# Patient Record
Sex: Male | Born: 1945 | Race: Black or African American | Hispanic: No | Marital: Married | Smoking: Former smoker
Health system: Southern US, Community
[De-identification: ages and names within clinical notes are randomized; demographics above are authoritative.]

## PROBLEM LIST (undated history)

## (undated) DIAGNOSIS — N189 Chronic kidney disease, unspecified: Secondary | ICD-10-CM

## (undated) DIAGNOSIS — I255 Ischemic cardiomyopathy: Secondary | ICD-10-CM

## (undated) DIAGNOSIS — K219 Gastro-esophageal reflux disease without esophagitis: Secondary | ICD-10-CM

## (undated) DIAGNOSIS — I5189 Other ill-defined heart diseases: Secondary | ICD-10-CM

## (undated) DIAGNOSIS — Z8673 Personal history of transient ischemic attack (TIA), and cerebral infarction without residual deficits: Secondary | ICD-10-CM

## (undated) DIAGNOSIS — E8809 Other disorders of plasma-protein metabolism, not elsewhere classified: Secondary | ICD-10-CM

## (undated) DIAGNOSIS — F1010 Alcohol abuse, uncomplicated: Secondary | ICD-10-CM

## (undated) DIAGNOSIS — L409 Psoriasis, unspecified: Secondary | ICD-10-CM

## (undated) DIAGNOSIS — I5042 Chronic combined systolic (congestive) and diastolic (congestive) heart failure: Secondary | ICD-10-CM

## (undated) DIAGNOSIS — I208 Other forms of angina pectoris: Secondary | ICD-10-CM

## (undated) DIAGNOSIS — I2510 Atherosclerotic heart disease of native coronary artery without angina pectoris: Secondary | ICD-10-CM

## (undated) DIAGNOSIS — H9190 Unspecified hearing loss, unspecified ear: Secondary | ICD-10-CM

## (undated) DIAGNOSIS — I82409 Acute embolism and thrombosis of unspecified deep veins of unspecified lower extremity: Secondary | ICD-10-CM

## (undated) DIAGNOSIS — K2270 Barrett's esophagus without dysplasia: Secondary | ICD-10-CM

## (undated) DIAGNOSIS — E785 Hyperlipidemia, unspecified: Secondary | ICD-10-CM

## (undated) DIAGNOSIS — I10 Essential (primary) hypertension: Secondary | ICD-10-CM

## (undated) DIAGNOSIS — R000 Tachycardia, unspecified: Secondary | ICD-10-CM

## (undated) DIAGNOSIS — R609 Edema, unspecified: Secondary | ICD-10-CM

## (undated) DIAGNOSIS — I1310 Hypertensive heart and chronic kidney disease without heart failure, with stage 1 through stage 4 chronic kidney disease, or unspecified chronic kidney disease: Secondary | ICD-10-CM

## (undated) DIAGNOSIS — I2089 Other forms of angina pectoris: Secondary | ICD-10-CM

## (undated) DIAGNOSIS — H47019 Ischemic optic neuropathy, unspecified eye: Secondary | ICD-10-CM

## (undated) DIAGNOSIS — R0989 Other specified symptoms and signs involving the circulatory and respiratory systems: Secondary | ICD-10-CM

## (undated) DIAGNOSIS — H547 Unspecified visual loss: Secondary | ICD-10-CM

## (undated) HISTORY — DX: Other forms of angina pectoris: I20.89

## (undated) HISTORY — DX: Edema, unspecified: R60.9

## (undated) HISTORY — PX: OTHER SURGICAL HISTORY: SHX169

## (undated) HISTORY — DX: Other forms of angina pectoris: I20.8

## (undated) HISTORY — DX: Atherosclerotic heart disease of native coronary artery without angina pectoris: I25.10

## (undated) HISTORY — PX: APPENDECTOMY: SHX54

## (undated) HISTORY — DX: Personal history of transient ischemic attack (TIA), and cerebral infarction without residual deficits: Z86.73

## (undated) HISTORY — DX: Other specified symptoms and signs involving the circulatory and respiratory systems: R09.89

## (undated) HISTORY — DX: Ischemic cardiomyopathy: I25.5

## (undated) HISTORY — DX: Other disorders of plasma-protein metabolism, not elsewhere classified: E88.09

## (undated) HISTORY — DX: Tachycardia, unspecified: R00.0

## (undated) HISTORY — DX: Chronic combined systolic (congestive) and diastolic (congestive) heart failure: I50.42

## (undated) HISTORY — DX: Hypertensive heart and chronic kidney disease without heart failure, with stage 1 through stage 4 chronic kidney disease, or unspecified chronic kidney disease: I13.10

---

## 1997-09-03 DIAGNOSIS — I2510 Atherosclerotic heart disease of native coronary artery without angina pectoris: Secondary | ICD-10-CM

## 1997-09-03 HISTORY — DX: Atherosclerotic heart disease of native coronary artery without angina pectoris: I25.10

## 1997-09-03 HISTORY — PX: CORONARY ARTERY BYPASS GRAFT: SHX141

## 2003-04-12 ENCOUNTER — Encounter: Admission: RE | Admit: 2003-04-12 | Discharge: 2003-04-12 | Payer: Self-pay | Admitting: Gastroenterology

## 2003-04-12 ENCOUNTER — Encounter: Payer: Self-pay | Admitting: Gastroenterology

## 2004-06-08 ENCOUNTER — Ambulatory Visit (HOSPITAL_COMMUNITY): Admission: RE | Admit: 2004-06-08 | Discharge: 2004-06-08 | Payer: Self-pay | Admitting: Family Medicine

## 2004-08-14 ENCOUNTER — Ambulatory Visit (HOSPITAL_COMMUNITY): Admission: RE | Admit: 2004-08-14 | Discharge: 2004-08-14 | Payer: Self-pay | Admitting: Gastroenterology

## 2004-08-14 ENCOUNTER — Encounter (INDEPENDENT_AMBULATORY_CARE_PROVIDER_SITE_OTHER): Payer: Self-pay | Admitting: *Deleted

## 2004-11-14 ENCOUNTER — Encounter: Admission: RE | Admit: 2004-11-14 | Discharge: 2004-11-14 | Payer: Self-pay | Admitting: Nephrology

## 2006-03-08 ENCOUNTER — Emergency Department (HOSPITAL_COMMUNITY): Admission: EM | Admit: 2006-03-08 | Discharge: 2006-03-08 | Payer: Self-pay | Admitting: Emergency Medicine

## 2007-02-14 HISTORY — PX: CARDIAC CATHETERIZATION: SHX172

## 2007-03-20 DIAGNOSIS — R0989 Other specified symptoms and signs involving the circulatory and respiratory systems: Secondary | ICD-10-CM

## 2007-03-20 HISTORY — DX: Other specified symptoms and signs involving the circulatory and respiratory systems: R09.89

## 2009-07-03 ENCOUNTER — Emergency Department (HOSPITAL_COMMUNITY): Admission: EM | Admit: 2009-07-03 | Discharge: 2009-07-03 | Payer: Self-pay | Admitting: Emergency Medicine

## 2010-11-02 HISTORY — PX: DOPPLER ECHOCARDIOGRAPHY: SHX263

## 2010-12-07 LAB — RAPID URINE DRUG SCREEN, HOSP PERFORMED
Amphetamines: NOT DETECTED
Benzodiazepines: NOT DETECTED
Tetrahydrocannabinol: NOT DETECTED

## 2010-12-07 LAB — TRICYCLICS SCREEN, URINE: TCA Scrn: NOT DETECTED

## 2010-12-07 LAB — DIFFERENTIAL
Basophils Absolute: 0 10*3/uL (ref 0.0–0.1)
Basophils Relative: 0 % (ref 0–1)
Lymphocytes Relative: 30 % (ref 12–46)
Monocytes Absolute: 0.5 10*3/uL (ref 0.1–1.0)
Neutro Abs: 2.8 10*3/uL (ref 1.7–7.7)
Neutrophils Relative %: 59 % (ref 43–77)

## 2010-12-07 LAB — BASIC METABOLIC PANEL
CO2: 19 mEq/L (ref 19–32)
Chloride: 97 mEq/L (ref 96–112)
GFR calc Af Amer: 52 mL/min — ABNORMAL LOW (ref >60)
Glucose, Bld: 114 mg/dL — ABNORMAL HIGH (ref 70–99)
Potassium: 3.4 mEq/L — ABNORMAL LOW (ref 3.5–5.1)
Sodium: 130 mEq/L — ABNORMAL LOW (ref 135–145)

## 2010-12-07 LAB — CBC
HCT: 41 % (ref 39.0–52.0)
MCHC: 33.5 g/dL (ref 30.0–36.0)
MCV: 92.1 fL (ref 78.0–100.0)
Platelets: 121 10*3/uL — ABNORMAL LOW (ref 150–400)
RBC: 4.46 MIL/uL (ref 4.22–5.81)
RDW: 13.9 % (ref 11.5–15.5)

## 2011-01-02 HISTORY — PX: NM MYOVIEW LTD: HXRAD82

## 2011-01-19 NOTE — Op Note (Signed)
NAME:  Devin Williams, Devin Williams             ACCOUNT NO.:  [id number]   MEDICAL RECORD NO.:  [id number]          PATIENT TYPE:  AMB   LOCATION:  ENDO                         FACILITY:  [facility]   PHYSICIAN:  Graylin Shiver, [name]   DATE OF BIRTH:  Aug 05, 1946   DATE OF PROCEDURE:  08/14/2004  DATE OF DISCHARGE:                                 OPERATIVE REPORT   PROCEDURES:  1.  Colonoscopy.  2.  Polypectomy.   INDICATIONS:  Screening.   Informed consent was obtained after explanation of the risks of bleeding,  infection and perforation.   PREMEDICATIONS:  1.  Fentanyl 75 mcg IV.  2.  Versed 6 mg IV.   DESCRIPTION OF PROCEDURE:  With the patient in the left lateral decubitus  position, a rectal exam was performed.  No masses were felt.  The Olympus  colonoscope was inserted into the rectum and advanced around the colon to  the cecum.  Cecal landmarks were identified.  The entire colonic mucosa had  an appearance of a mild melanosis coli.  The cecum otherwise looked normal.  The ascending colon and the transverse colon showed a few diverticula.  The  descending colon and sigmoid showed moderate diverticulosis.  In the distal  sigmoid, there was a 5 mm sessile polyp snared with a mini snare and removed  by snare cautery technique.  The cautery site looked good.  The rectum had  no other abnormalities.  The patient tolerated the procedure well without  complications.   IMPRESSION:  1.  Diverticulosis.  2.  Small sigmoid polyp which was removed.  3.  Mild diffuse melanosis coli.   PLAN:  The pathology will be checked.     SFG/MEDQ  D:  08/14/2004  T:  08/14/2004  Job:  [id number]   cc:   Renaye Rakers, [name]  [phone] [address], [address]  [place]  [place] [address]  Fax: [phone]

## 2011-05-14 ENCOUNTER — Other Ambulatory Visit: Payer: Self-pay | Admitting: Family Medicine

## 2011-05-14 DIAGNOSIS — M7989 Other specified soft tissue disorders: Secondary | ICD-10-CM

## 2011-05-15 ENCOUNTER — Emergency Department (HOSPITAL_COMMUNITY)
Admission: EM | Admit: 2011-05-15 | Discharge: 2011-05-15 | Disposition: A | Discharge status: Home / Self Care | Payer: BC Managed Care – PPO | Attending: Emergency Medicine | Admitting: Emergency Medicine

## 2011-05-15 ENCOUNTER — Ambulatory Visit
Admission: RE | Admit: 2011-05-15 | Discharge: 2011-05-15 | Disposition: A | Discharge status: Home / Self Care | Payer: BC Managed Care – PPO | Source: Ambulatory Visit | Attending: Family Medicine | Admitting: Family Medicine

## 2011-05-15 DIAGNOSIS — E785 Hyperlipidemia, unspecified: Secondary | ICD-10-CM | POA: Insufficient documentation

## 2011-05-15 DIAGNOSIS — M79609 Pain in unspecified limb: Secondary | ICD-10-CM | POA: Insufficient documentation

## 2011-05-15 DIAGNOSIS — I10 Essential (primary) hypertension: Secondary | ICD-10-CM | POA: Insufficient documentation

## 2011-05-15 DIAGNOSIS — H409 Unspecified glaucoma: Secondary | ICD-10-CM | POA: Insufficient documentation

## 2011-05-15 DIAGNOSIS — I82409 Acute embolism and thrombosis of unspecified deep veins of unspecified lower extremity: Secondary | ICD-10-CM | POA: Insufficient documentation

## 2011-05-15 DIAGNOSIS — M7989 Other specified soft tissue disorders: Secondary | ICD-10-CM

## 2011-05-30 ENCOUNTER — Emergency Department (HOSPITAL_COMMUNITY)
Admit: 2011-05-30 | Discharge: 2011-05-30 | Disposition: A | Discharge status: Home / Self Care | Payer: BC Managed Care – PPO | Attending: Emergency Medicine | Admitting: Emergency Medicine

## 2011-05-30 DIAGNOSIS — Z951 Presence of aortocoronary bypass graft: Secondary | ICD-10-CM | POA: Insufficient documentation

## 2011-05-30 DIAGNOSIS — I10 Essential (primary) hypertension: Secondary | ICD-10-CM | POA: Insufficient documentation

## 2011-05-30 DIAGNOSIS — E785 Hyperlipidemia, unspecified: Secondary | ICD-10-CM | POA: Insufficient documentation

## 2011-05-30 DIAGNOSIS — Z7901 Long term (current) use of anticoagulants: Secondary | ICD-10-CM | POA: Insufficient documentation

## 2011-05-30 DIAGNOSIS — R791 Abnormal coagulation profile: Secondary | ICD-10-CM | POA: Insufficient documentation

## 2011-05-30 LAB — DIFFERENTIAL
Basophils Relative: 0 % (ref 0–1)
Eosinophils Absolute: 0.1 10*3/uL (ref 0.0–0.7)
Eosinophils Relative: 1 % (ref 0–5)
Lymphocytes Relative: 21 % (ref 12–46)
Lymphs Abs: 1 10*3/uL (ref 0.7–4.0)
Monocytes Relative: 12 % (ref 3–12)
Neutrophils Relative %: 66 % (ref 43–77)

## 2011-05-30 LAB — CBC
MCV: 87.9 fL (ref 78.0–100.0)
RDW: 14.6 % (ref 11.5–15.5)
WBC: 4.5 10*3/uL (ref 4.0–10.5)

## 2011-05-30 LAB — PROTIME-INR: INR: 5.8 (ref 0.00–1.49)

## 2011-07-20 ENCOUNTER — Other Ambulatory Visit: Payer: Self-pay | Admitting: Family Medicine

## 2011-07-20 DIAGNOSIS — R52 Pain, unspecified: Secondary | ICD-10-CM

## 2011-07-20 DIAGNOSIS — R609 Edema, unspecified: Secondary | ICD-10-CM

## 2011-07-23 ENCOUNTER — Other Ambulatory Visit: Payer: BC Managed Care – PPO

## 2011-07-23 ENCOUNTER — Inpatient Hospital Stay
Admission: RE | Admit: 2011-07-23 | Discharge: 2011-07-23 | Payer: BC Managed Care – PPO | Source: Ambulatory Visit | Attending: Family Medicine | Admitting: Family Medicine

## 2012-01-18 ENCOUNTER — Other Ambulatory Visit: Payer: Self-pay | Admitting: Family Medicine

## 2012-01-18 DIAGNOSIS — M79604 Pain in right leg: Secondary | ICD-10-CM

## 2012-01-21 ENCOUNTER — Other Ambulatory Visit: Payer: BC Managed Care – PPO

## 2012-02-29 ENCOUNTER — Ambulatory Visit
Admission: RE | Admit: 2012-02-29 | Discharge: 2012-02-29 | Disposition: A | Discharge status: Home / Self Care | Payer: BC Managed Care – PPO | Source: Ambulatory Visit | Attending: Family Medicine | Admitting: Family Medicine

## 2012-02-29 DIAGNOSIS — M79604 Pain in right leg: Secondary | ICD-10-CM

## 2012-11-01 DIAGNOSIS — I82409 Acute embolism and thrombosis of unspecified deep veins of unspecified lower extremity: Secondary | ICD-10-CM

## 2012-11-01 DIAGNOSIS — I5189 Other ill-defined heart diseases: Secondary | ICD-10-CM

## 2012-11-01 HISTORY — DX: Other ill-defined heart diseases: I51.89

## 2012-11-01 HISTORY — DX: Acute embolism and thrombosis of unspecified deep veins of unspecified lower extremity: I82.409

## 2012-11-17 ENCOUNTER — Other Ambulatory Visit: Payer: Self-pay

## 2012-11-17 ENCOUNTER — Encounter (HOSPITAL_COMMUNITY): Payer: Self-pay

## 2012-11-17 ENCOUNTER — Inpatient Hospital Stay (HOSPITAL_COMMUNITY)
Admission: EM | Admit: 2012-11-17 | Discharge: 2012-11-24 | DRG: 300 | Disposition: A | Discharge status: Skilled Nursing Facility | Payer: Medicare Other | Attending: Internal Medicine | Admitting: Internal Medicine

## 2012-11-17 ENCOUNTER — Emergency Department (HOSPITAL_COMMUNITY): Payer: Medicare Other

## 2012-11-17 DIAGNOSIS — Z9114 Patient's other noncompliance with medication regimen: Secondary | ICD-10-CM

## 2012-11-17 DIAGNOSIS — I129 Hypertensive chronic kidney disease with stage 1 through stage 4 chronic kidney disease, or unspecified chronic kidney disease: Secondary | ICD-10-CM | POA: Diagnosis present

## 2012-11-17 DIAGNOSIS — I82409 Acute embolism and thrombosis of unspecified deep veins of unspecified lower extremity: Secondary | ICD-10-CM | POA: Diagnosis present

## 2012-11-17 DIAGNOSIS — Z9119 Patient's noncompliance with other medical treatment and regimen: Secondary | ICD-10-CM

## 2012-11-17 DIAGNOSIS — E875 Hyperkalemia: Secondary | ICD-10-CM

## 2012-11-17 DIAGNOSIS — N189 Chronic kidney disease, unspecified: Secondary | ICD-10-CM | POA: Diagnosis present

## 2012-11-17 DIAGNOSIS — N179 Acute kidney failure, unspecified: Secondary | ICD-10-CM

## 2012-11-17 DIAGNOSIS — D6959 Other secondary thrombocytopenia: Secondary | ICD-10-CM | POA: Diagnosis present

## 2012-11-17 DIAGNOSIS — I82403 Acute embolism and thrombosis of unspecified deep veins of lower extremity, bilateral: Secondary | ICD-10-CM

## 2012-11-17 DIAGNOSIS — M7989 Other specified soft tissue disorders: Secondary | ICD-10-CM

## 2012-11-17 DIAGNOSIS — E872 Acidosis, unspecified: Secondary | ICD-10-CM

## 2012-11-17 DIAGNOSIS — I2510 Atherosclerotic heart disease of native coronary artery without angina pectoris: Secondary | ICD-10-CM | POA: Diagnosis present

## 2012-11-17 DIAGNOSIS — Z91199 Patient's noncompliance with other medical treatment and regimen due to unspecified reason: Secondary | ICD-10-CM

## 2012-11-17 DIAGNOSIS — F1020 Alcohol dependence, uncomplicated: Secondary | ICD-10-CM | POA: Diagnosis present

## 2012-11-17 DIAGNOSIS — I824Y9 Acute embolism and thrombosis of unspecified deep veins of unspecified proximal lower extremity: Principal | ICD-10-CM | POA: Diagnosis present

## 2012-11-17 DIAGNOSIS — Z951 Presence of aortocoronary bypass graft: Secondary | ICD-10-CM

## 2012-11-17 DIAGNOSIS — I10 Essential (primary) hypertension: Secondary | ICD-10-CM | POA: Diagnosis present

## 2012-11-17 DIAGNOSIS — Z86718 Personal history of other venous thrombosis and embolism: Secondary | ICD-10-CM

## 2012-11-17 DIAGNOSIS — E785 Hyperlipidemia, unspecified: Secondary | ICD-10-CM | POA: Diagnosis present

## 2012-11-17 HISTORY — DX: Essential (primary) hypertension: I10

## 2012-11-17 HISTORY — DX: Chronic kidney disease, unspecified: N18.9

## 2012-11-17 HISTORY — DX: Hyperlipidemia, unspecified: E78.5

## 2012-11-17 HISTORY — DX: Psoriasis, unspecified: L40.9

## 2012-11-17 HISTORY — DX: Acute embolism and thrombosis of unspecified deep veins of unspecified lower extremity: I82.409

## 2012-11-17 LAB — POCT I-STAT, CHEM 8
Chloride: 106 mEq/L (ref 96–112)
Creatinine, Ser: 3.1 mg/dL — ABNORMAL HIGH (ref 0.50–1.35)
Glucose, Bld: 167 mg/dL — ABNORMAL HIGH (ref 70–99)
Potassium: 6.6 mEq/L (ref 3.5–5.1)

## 2012-11-17 LAB — PROTIME-INR
INR: 1.09 (ref 0.00–1.49)
Prothrombin Time: 14 seconds (ref 11.6–15.2)

## 2012-11-17 LAB — COMPREHENSIVE METABOLIC PANEL
Albumin: 2.7 g/dL — ABNORMAL LOW (ref 3.5–5.2)
Alkaline Phosphatase: 184 U/L — ABNORMAL HIGH (ref 39–117)
BUN: 15 mg/dL (ref 6–23)
BUN: 15 mg/dL (ref 6–23)
Calcium: 8.8 mg/dL (ref 8.4–10.5)
Calcium: 8.8 mg/dL (ref 8.4–10.5)
Creatinine, Ser: 2.74 mg/dL — ABNORMAL HIGH (ref 0.50–1.35)
GFR calc Af Amer: 26 mL/min — ABNORMAL LOW (ref >90)
GFR calc Af Amer: 26 mL/min — ABNORMAL LOW (ref >90)
GFR calc non Af Amer: 23 mL/min — ABNORMAL LOW (ref >90)
Glucose, Bld: 159 mg/dL — ABNORMAL HIGH (ref 70–99)
Glucose, Bld: 110 mg/dL — ABNORMAL HIGH (ref 70–99)
Potassium: 6.5 mEq/L (ref 3.5–5.1)
Potassium: 6.2 mEq/L — ABNORMAL HIGH (ref 3.5–5.1)
Total Protein: 8 g/dL (ref 6.0–8.3)
Total Protein: 6.7 g/dL (ref 6.0–8.3)

## 2012-11-17 LAB — APTT: aPTT: 30 seconds (ref 24–37)

## 2012-11-17 LAB — CBC WITH DIFFERENTIAL/PLATELET
Eosinophils Absolute: 0.2 10*3/uL (ref 0.0–0.7)
Eosinophils Relative: 4 % (ref 0–5)
Hemoglobin: 14.5 g/dL (ref 13.0–17.0)
Lymphs Abs: 0.7 10*3/uL (ref 0.7–4.0)
MCH: 31.3 pg (ref 26.0–34.0)
MCV: 91.6 fL (ref 78.0–100.0)
Monocytes Relative: 13 % — ABNORMAL HIGH (ref 3–12)
RBC: 4.63 MIL/uL (ref 4.22–5.81)

## 2012-11-17 LAB — URINALYSIS, ROUTINE W REFLEX MICROSCOPIC
Ketones, ur: NEGATIVE mg/dL
Leukocytes, UA: NEGATIVE
Nitrite: NEGATIVE
Urobilinogen, UA: 0.2 mg/dL (ref 0.0–1.0)
pH: 5 (ref 5.0–8.0)

## 2012-11-17 LAB — TROPONIN I: Troponin I: <0.3 ng/mL (ref <0.30)

## 2012-11-17 LAB — D-DIMER, QUANTITATIVE: D-Dimer, Quant: 9.69 ug/mL-FEU — ABNORMAL HIGH (ref 0.00–0.48)

## 2012-11-17 MED ORDER — ACETAMINOPHEN 650 MG RE SUPP: 650.0000 mg | Freq: Four times a day (QID) | RECTAL | Status: DC | PRN

## 2012-11-17 MED ORDER — FOLIC ACID 1 MG PO TABS
1.0000 mg | ORAL_TABLET | Freq: Every day | ORAL | Status: DC
  Administered 2012-11-18 – 2012-11-24 (×7): 1 mg via ORAL
  Filled 2012-11-17 (×7): qty 1

## 2012-11-17 MED ORDER — DIPHENHYDRAMINE HCL 50 MG PO CAPS
50.0000 mg | ORAL_CAPSULE | ORAL | Status: DC | PRN
  Administered 2012-11-18 – 2012-11-24 (×19): 50 mg via ORAL
  Filled 2012-11-17 (×19): qty 1

## 2012-11-17 MED ORDER — DIPHENHYDRAMINE HCL 50 MG/ML IJ SOLN
25.0000 mg | Freq: Once | INTRAMUSCULAR | Status: AC
  Administered 2012-11-18: 25 mg via INTRAVENOUS
  Filled 2012-11-17: qty 1

## 2012-11-17 MED ORDER — VITAMIN B-1 100 MG PO TABS
100.0000 mg | ORAL_TABLET | Freq: Every day | ORAL | Status: DC
  Administered 2012-11-18 – 2012-11-24 (×7): 100 mg via ORAL
  Filled 2012-11-17 (×7): qty 1

## 2012-11-17 MED ORDER — ONDANSETRON HCL 4 MG/2ML IJ SOLN: 4.0000 mg | Freq: Four times a day (QID) | INTRAMUSCULAR | Status: DC | PRN

## 2012-11-17 MED ORDER — HYDROCORTISONE 0.5 % EX CREA
1.0000 "application " | TOPICAL_CREAM | Freq: Two times a day (BID) | CUTANEOUS | Status: DC
  Administered 2012-11-18 – 2012-11-24 (×13): 1 via TOPICAL
  Filled 2012-11-17 (×3): qty 28.35

## 2012-11-17 MED ORDER — SODIUM CHLORIDE 0.9 % IV SOLN
INTRAVENOUS | Status: DC
  Administered 2012-11-17: 19:00:00 via INTRAVENOUS

## 2012-11-17 MED ORDER — ATORVASTATIN CALCIUM 20 MG PO TABS
20.0000 mg | ORAL_TABLET | Freq: Every day | ORAL | Status: DC
  Administered 2012-11-18 – 2012-11-23 (×7): 20 mg via ORAL
  Filled 2012-11-17 (×8): qty 1

## 2012-11-17 MED ORDER — ASPIRIN EC 81 MG PO TBEC
81.0000 mg | DELAYED_RELEASE_TABLET | Freq: Every day | ORAL | Status: DC
  Administered 2012-11-18 – 2012-11-24 (×7): 81 mg via ORAL
  Filled 2012-11-17 (×7): qty 1

## 2012-11-17 MED ORDER — LORAZEPAM 2 MG/ML IJ SOLN: 1.0000 mg | Freq: Four times a day (QID) | INTRAMUSCULAR | Status: AC | PRN

## 2012-11-17 MED ORDER — ACETAMINOPHEN 325 MG PO TABS
650.0000 mg | ORAL_TABLET | Freq: Four times a day (QID) | ORAL | Status: DC | PRN
  Administered 2012-11-20 – 2012-11-24 (×9): 650 mg via ORAL
  Filled 2012-11-17 (×9): qty 2

## 2012-11-17 MED ORDER — SODIUM CHLORIDE 0.9 % IV SOLN
INTRAVENOUS | Status: DC
  Administered 2012-11-17: 1000 mL via INTRAVENOUS

## 2012-11-17 MED ORDER — LORAZEPAM 1 MG PO TABS
1.0000 mg | ORAL_TABLET | Freq: Four times a day (QID) | ORAL | Status: AC | PRN
  Filled 2012-11-17: qty 1

## 2012-11-17 MED ORDER — ACITRETIN 25 MG PO CAPS
25.0000 mg | ORAL_CAPSULE | Freq: Every day | ORAL | Status: DC
  Administered 2012-11-18 – 2012-11-24 (×7): 25 mg via ORAL
  Filled 2012-11-17: qty 1

## 2012-11-17 MED ORDER — LORAZEPAM 1 MG PO TABS: 0.0000 mg | ORAL_TABLET | Freq: Four times a day (QID) | ORAL | Status: AC

## 2012-11-17 MED ORDER — FENOFIBRATE 160 MG PO TABS
160.0000 mg | ORAL_TABLET | Freq: Every day | ORAL | Status: DC
  Administered 2012-11-18 – 2012-11-24 (×7): 160 mg via ORAL
  Filled 2012-11-17 (×7): qty 1

## 2012-11-17 MED ORDER — ADULT MULTIVITAMIN W/MINERALS CH
1.0000 | ORAL_TABLET | Freq: Every day | ORAL | Status: DC
  Administered 2012-11-18 – 2012-11-24 (×7): 1 via ORAL
  Filled 2012-11-17 (×7): qty 1

## 2012-11-17 MED ORDER — LORAZEPAM 1 MG PO TABS
0.0000 mg | ORAL_TABLET | Freq: Two times a day (BID) | ORAL | Status: AC
  Administered 2012-11-20: 1 mg via ORAL

## 2012-11-17 MED ORDER — SODIUM CHLORIDE 0.9 % IV BOLUS (SEPSIS)
500.0000 mL | Freq: Once | INTRAVENOUS | Status: AC
  Administered 2012-11-17: 500 mL via INTRAVENOUS

## 2012-11-17 MED ORDER — HEPARIN (PORCINE) IN NACL 100-0.45 UNIT/ML-% IJ SOLN
1500.0000 [IU]/h | INTRAMUSCULAR | Status: DC
  Administered 2012-11-17 – 2012-11-18 (×2): 1500 [IU]/h via INTRAVENOUS
  Filled 2012-11-17 (×2): qty 250

## 2012-11-17 MED ORDER — FENOFIBRIC ACID 135 MG PO CPDR: 1.0000 | DELAYED_RELEASE_CAPSULE | Freq: Every day | ORAL | Status: DC

## 2012-11-17 MED ORDER — METOPROLOL TARTRATE 50 MG PO TABS
50.0000 mg | ORAL_TABLET | Freq: Two times a day (BID) | ORAL | Status: DC
  Administered 2012-11-18 – 2012-11-19 (×5): 50 mg via ORAL
  Filled 2012-11-17 (×8): qty 1

## 2012-11-17 MED ORDER — SODIUM CHLORIDE 0.9 % IJ SOLN
3.0000 mL | Freq: Two times a day (BID) | INTRAMUSCULAR | Status: DC
  Administered 2012-11-18 (×2): 3 mL via INTRAVENOUS
  Administered 2012-11-18: 09:00:00 via INTRAVENOUS
  Administered 2012-11-19: 3 mL via INTRAVENOUS

## 2012-11-17 MED ORDER — SODIUM POLYSTYRENE SULFONATE 15 GM/60ML PO SUSP
30.0000 g | Freq: Once | ORAL | Status: DC
  Filled 2012-11-17: qty 120

## 2012-11-17 MED ORDER — SODIUM POLYSTYRENE SULFONATE 15 GM/60ML PO SUSP
30.0000 g | Freq: Once | ORAL | Status: AC
  Administered 2012-11-17: 30 g via ORAL
  Filled 2012-11-17: qty 120

## 2012-11-17 MED ORDER — THIAMINE HCL 100 MG/ML IJ SOLN
100.0000 mg | Freq: Every day | INTRAMUSCULAR | Status: DC
  Filled 2012-11-17 (×7): qty 1

## 2012-11-17 MED ORDER — ONDANSETRON HCL 4 MG PO TABS
4.0000 mg | ORAL_TABLET | Freq: Four times a day (QID) | ORAL | Status: DC | PRN
  Administered 2012-11-19: 4 mg via ORAL
  Filled 2012-11-17: qty 1

## 2012-11-17 MED ORDER — HEPARIN BOLUS VIA INFUSION
4500.0000 [IU] | Freq: Once | INTRAVENOUS | Status: AC
  Administered 2012-11-17: 4500 [IU] via INTRAVENOUS

## 2012-11-17 NOTE — ED Notes (Signed)
Patient's wife reports that the patient was SOB 3-4 days ago and has increased today. Patient was seen by his PCP and was told to go the ED for further evaluation and treatment. Patient has SOB, HR-134, lethargic and weakness.

## 2012-11-17 NOTE — ED Notes (Signed)
Effie Shy EDP made aware of I-Stat results.

## 2012-11-17 NOTE — Progress Notes (Signed)
Arterial blood gas draw attempted X2 RT's without success. RN and Dr. Toniann Fail aware.

## 2012-11-17 NOTE — Progress Notes (Signed)
Called ED for report from nurse Amy. Advised Dr. Toniann Fail in room with pt now.

## 2012-11-17 NOTE — H&P (Signed)
[facility] History and Physical  Devin Williams ZOX:[id number] DOB: 01/25/1946 DOA: 11/17/2012  Referring physician: Dr.Wentz. PCP: No primary provider on file. Dr,Gerald Hill. Specialists: None.  Chief Complaint: Shortness of breath.  HPI: Devin Williams is a 67 y.o. male history of the DVT diagnosed last May and has not been compliant with his medications presents with complaints of increasing shortness of breath over the last 3-4 days. Patient has been feeling short of breath with increased exertion. Denies any chest pain productive cough fever chills. Denies any leg pain or swelling in the lower extremities. He had gone to his PCP today and was referred to the ER because of his symptoms. Dopplers of the lower extremities showed bilateral lower extremity DVT with mobile clot. CT angiogram was unable to be done because of patient's renal failure. Patient was found to be in acute on chronic renal failure with hyperkalemia. EKG was showing sinus tachycardia. Patient was given Kayexalate. At this time patient has been admitted for further management. Patient denies any nausea vomiting abdominal pain dizziness or loss of consciousness. Presently patient is hemodynamically stable.  Review of Systems: As presented in the history of presenting illness nothing else significant.  Past Medical History  Diagnosis Date  . Hypertension   . DVT (deep venous thrombosis)     right leg  . Psoriasis   . Chronic kidney disease   . Coronary artery disease   . Hyperlipidemia    Past Surgical History  Procedure Laterality Date  . Cardiac surgery    . Knee surgery    . Appendectomy    . Coronary artery bypass graft     Social History:  reports that he quit smoking about 12 years ago. He has never used smokeless tobacco. He reports that  drinks alcohol. He reports that he does not use illicit drugs. Lives at home. where does patient live-- Can do ADLs. Can patient participate in ADLs?  No  Known Allergies  Family History  Problem Relation Age of Onset  . Diabetes Mellitus II Father      Prior to Admission medications   Medication Sig Start Date End Date Taking? Authorizing Provider  acitretin (SORIATANE) 25 MG capsule Take 25 mg by mouth daily before breakfast.   Yes Historical Provider, MD  amLODipine (NORVASC) 10 MG tablet Take 5 mg by mouth every morning.   Yes Historical Provider, MD  aspirin EC 81 MG tablet Take 81 mg by mouth daily.   Yes Historical Provider, MD  atorvastatin (LIPITOR) 20 MG tablet Take 20 mg by mouth at bedtime.   Yes Historical Provider, MD  Choline Fenofibrate (FENOFIBRIC ACID) 135 MG CPDR Take 1 capsule by mouth daily.   Yes Historical Provider, MD  hydrocortisone cream 0.5 % Apply 1 application topically 2 (two) times daily.   Yes Historical Provider, MD  lisinopril (PRINIVIL,ZESTRIL) 20 MG tablet Take 20 mg by mouth daily.   Yes Historical Provider, MD  metoprolol (LOPRESSOR) 50 MG tablet Take 50 mg by mouth 2 (two) times daily.   Yes Historical Provider, MD  Multiple Vitamin (MULTIVITAMIN WITH MINERALS) TABS Take 1 tablet by mouth daily.   Yes Historical Provider, MD  potassium chloride (K-DUR,KLOR-CON) 10 MEQ tablet Take 10 mEq by mouth daily.   Yes Historical Provider, MD   Physical Exam: Filed Vitals:   11/17/12 1429 11/17/12 1936 11/17/12 2130  BP: 116/85 122/63 141/86  Pulse: 131 120 132  Temp:  98.8 F (37.1 C)   TempSrc:  Oral   Resp:  24 21  Height:  5\' 9"  (1.753 m)   Weight:  92.987 kg (205 lb)   SpO2: 99% 93% 98%     General:  Well-developed and nourished.  Eyes: No vision in the left eye. Anicteric no pallor.  ENT: No discharge from ears eyes nose or mouth.  Neck: No mass or JVD felt.  Cardiovascular: S1-S2 heard tachycardic.  Respiratory: No rhonchi or crepitations.  Abdomen: Soft nontender bowel sounds present.  Skin: Psoriatic rash seen.  Musculoskeletal: No edema.  Psychiatric: Appears  normal.  Neurologic: Alert awake oriented to time place and person. Moves all extremities.  Labs on Admission:  Basic Metabolic Panel:  Recent Labs Lab 11/17/12 1519 11/17/12 1545 11/17/12 1805  NA 134* 129* 133*  K 6.6* 6.5* 6.2*  CL 106 95* 100  CO2  --  16* 18*  GLUCOSE 167* 159* 110*  BUN 21 15 15   CREATININE 3.10* 2.76* 2.74*  CALCIUM  --  8.8 8.8   Liver Function Tests:  Recent Labs Lab 11/17/12 1545 11/17/12 1805  AST 149* 111*  ALT 62* 50  ALKPHOS 184* 163*  BILITOT 0.6 0.6  PROT 8.0 6.7  ALBUMIN 3.1* 2.7*   No results found for this basename: LIPASE, AMYLASE,  in the last 168 hours No results found for this basename: AMMONIA,  in the last 168 hours CBC:  Recent Labs Lab 11/17/12 1519 11/17/12 1539  WBC  --  5.2  NEUTROABS  --  3.6  HGB 17.0 14.5  HCT 50.0 42.4  MCV  --  91.6  PLT  --  184   Cardiac Enzymes:  Recent Labs Lab 11/17/12 1545  TROPONINI <0.30    BNP (last 3 results)  Recent Labs  11/17/12 1545  PROBNP 3909.0*   CBG: No results found for this basename: GLUCAP,  in the last 168 hours  Radiological Exams on Admission: Dg Chest 2 View  11/17/2012  *RADIOLOGY REPORT*  Clinical Data: Cough  CHEST - 2 VIEW  Comparison: 07/03/2009  Findings: Postop CABG.  Negative for heart failure.  Lungs are clear without infiltrate or effusion.  Multiple healed chronic rib fractures.  IMPRESSION: No acute cardiopulmonary abnormality.   Original Report Authenticated By: [name], [name]     EKG: Independently reviewed. Sinus tachycardia.  Assessment/Plan Principal Problem:   DVT (deep venous thrombosis) Active Problems:   Renal failure (ARF), acute on chronic   CAD (coronary artery disease)   Alcoholism   HTN (hypertension)   Hyperlipidemia   1. Shortness of breath with bilateral DVT concerning for PE - patient has been started on heparin infusion. VQ scan has been ordered. I have discussed with on-call pulmonary critical care.  Patient will be closely monitored in step down. Check troponins and 2-D echo. 2. Acute renal failure on chronic kidney disease with hyperkalemia - hold lisinopril and potassium replacement. Patient so far has been ordered Kayexalate 60 g. Starting patient on bicarbonate drip. Closely follow intake output and metabolic panel. Check UA. 3. Metabolic acidosis - probably from renal failure. Check lactic acid levels. Patient has been started on bicarbonate drip. 4. CAD status post CABG - denies any chest pain. Troponins to be cycle due to possible PE. 5. Hypertension - hold her lisinopril due to renal failure and amlodipine due to low-normal blood pressure. 6. Alcohol abuse - patient has been placed on alcohol withdrawal protocol.    Code Status: Full code.  Family Communication: Wife at the bedside.  Disposition Plan: Admit to inpatient.    Chantella Creech N. [facility] Pager [phone].  If 7PM-7AM, please contact night-coverage [email or web address] Password [facility] 11/17/2012, 10:35 PM

## 2012-11-17 NOTE — ED Provider Notes (Signed)
History     CSN: [id number]  Arrival date & time 11/17/12  1424   First MD Initiated Contact with Patient 11/17/12 1542      Chief Complaint  Patient presents with  . Shortness of Breath    (Consider location/radiation/quality/duration/timing/severity/associated sxs/prior treatment) HPI Comments: Devin Williams is a 67 y.o. Male who is here for evaluation of shortness of breath. The discomfort started 3 days ago. He went to see his PCP today, and was instructed to come to the ED because of the possibility of a "PE". He has previously had DVT, 10 months ago, for which he had a short treatment course of Coumadin. It is unclear why he stopped. He has never had PE. He denies cough, sputum production, nausea, vomiting, weakness, or dizziness. His generalized itching from his psoriasis. He is using hydroxyzine, for pruritus; it has helped. He admits to only intermittently taking some of his medicines for the last month. He, states he'll reason is not taking his medicines, is "my choice". No other known modifying factors.  Patient is a 67 y.o. male presenting with shortness of breath. The history is provided by the patient.  Shortness of Breath   Past Medical History  Diagnosis Date  . Hypertension   . DVT (deep venous thrombosis)     right leg  . Psoriasis   . Chronic kidney disease   . Coronary artery disease   . Hyperlipidemia     Past Surgical History  Procedure Laterality Date  . Cardiac surgery    . Knee surgery    . Appendectomy    . Coronary artery bypass graft      Family History  Problem Relation Age of Onset  . Diabetes Mellitus II Father     History  Substance Use Topics  . Smoking status: Former Smoker    Quit date: 11/17/2000  . Smokeless tobacco: Never Used  . Alcohol Use: Yes     Comment: when I drink I drink for months drinkin g for the past 2 months      Review of Systems  Respiratory: Positive for shortness of breath.   All other systems  reviewed and are negative.    Allergies  Review of patient's allergies indicates no known allergies.  Home Medications   No current outpatient prescriptions on file.  BP 126/81  Pulse 132  Temp(Src) 97.4 F (36.3 C) (Oral)  Resp 21  Ht 5\' 9"  (1.753 m)  Wt 199 lb 15.3 oz (90.7 kg)  BMI 29.52 kg/m2  SpO2 98%  Physical Exam  Nursing note and vitals reviewed. Constitutional: He is oriented to person, place, and time. He appears well-developed and well-nourished.  HENT:  Head: Normocephalic and atraumatic.  Right Ear: External ear normal.  Left Ear: External ear normal.  Eyes: Conjunctivae and EOM are normal. Pupils are equal, round, and reactive to light.  Neck: Normal range of motion and phonation normal. Neck supple.  Cardiovascular: Regular rhythm, normal heart sounds and intact distal pulses.   Tachycardic  Pulmonary/Chest: Effort normal and breath sounds normal. No respiratory distress. He has no wheezes. He has no rales. He exhibits no bony tenderness.  Abdominal: Soft. Normal appearance. There is no tenderness.  Musculoskeletal: Normal range of motion.  Neurological: He is alert and oriented to person, place, and time. He has normal strength. No cranial nerve deficit or sensory deficit. He exhibits normal muscle tone. Coordination normal.  Skin: Skin is warm, dry and intact.  Generalized rash, consistent with  psoriasis, patient itches frequently  Psychiatric: He has a normal mood and affect. His behavior is normal. Judgment and thought content normal.    ED Course  Procedures (including critical care time)  Emergency Department treatment: Kayexalate for hyperkalemia    Date: 06/20/2012  Rate: 130  Rhythm: sinus tachycardia  QRS Axis: normal  PR and QT Intervals: normal  ST/T Wave abnormalities: normal  PR and QRS Conduction Disutrbances:nonspecific intraventricular conduction delay  Narrative Interpretation:   Old EKG Reviewed: changes noted- since  07/03/2009, rate faster  IV fluids, given for acute renal failure, and tachycardia; with volume  Heparin anticoagulation begun in the emergency department   CRITICAL CARE Performed by: Flint Melter   Total critical care time: 40 minutes  Critical care time was exclusive of separately billable procedures and treating other patients.  Critical care was necessary to treat or prevent imminent or life-threatening deterioration.  Critical care was time spent personally by me on the following activities: development of treatment plan with patient and/or surrogate as well as nursing, discussions with consultants, evaluation of patient's response to treatment, examination of patient, obtaining history from patient or surrogate, ordering and performing treatments and interventions, ordering and review of laboratory studies, ordering and review of radiographic studies, pulse oximetry and re-evaluation of patient's condition.  Labs Reviewed  CBC WITH DIFFERENTIAL - Abnormal; Notable for the following:    Monocytes Relative 13 (*)    All other components within normal limits  COMPREHENSIVE METABOLIC PANEL - Abnormal; Notable for the following:    Sodium 129 (*)    Potassium 6.5 (*)    Chloride 95 (*)    CO2 16 (*)    Glucose, Bld 159 (*)    Creatinine, Ser 2.76 (*)    Albumin 3.1 (*)    AST 149 (*)    ALT 62 (*)    Alkaline Phosphatase 184 (*)    GFR calc non Af Amer 23 (*)    GFR calc Af Amer 26 (*)    All other components within normal limits  URINALYSIS, ROUTINE W REFLEX MICROSCOPIC - Abnormal; Notable for the following:    APPearance CLOUDY (*)    All other components within normal limits  D-DIMER, QUANTITATIVE - Abnormal; Notable for the following:    D-Dimer, Quant 9.69 (*)    All other components within normal limits  PRO B NATRIURETIC PEPTIDE - Abnormal; Notable for the following:    Pro B Natriuretic peptide (BNP) 3909.0 (*)    All other components within normal limits   COMPREHENSIVE METABOLIC PANEL - Abnormal; Notable for the following:    Sodium 133 (*)    Potassium 6.2 (*)    CO2 18 (*)    Glucose, Bld 110 (*)    Creatinine, Ser 2.74 (*)    Albumin 2.7 (*)    AST 111 (*)    Alkaline Phosphatase 163 (*)    GFR calc non Af Amer 23 (*)    GFR calc Af Amer 26 (*)    All other components within normal limits  POCT I-STAT, CHEM 8 - Abnormal; Notable for the following:    Sodium 134 (*)    Potassium 6.6 (*)    Creatinine, Ser 3.10 (*)    Glucose, Bld 167 (*)    All other components within normal limits  URINE CULTURE  TROPONIN I  PROTIME-INR  APTT  HEPARIN LEVEL (UNFRACTIONATED)  CBC  TROPONIN I  TROPONIN I  TROPONIN I  BASIC METABOLIC PANEL  Dg Chest 2 View  11/17/2012  *RADIOLOGY REPORT*  Clinical Data: Cough  CHEST - 2 VIEW  Comparison: 07/03/2009  Findings: Postop CABG.  Negative for heart failure.  Lungs are clear without infiltrate or effusion.  Multiple healed chronic rib fractures.  IMPRESSION: No acute cardiopulmonary abnormality.   Original Report Authenticated By: [name], [name]    Nursing Notes Reviewed/ Care Coordinated, and agree without changes. Applicable Imaging Reviewed. Radiologic imaging report reviewed and images by radiography   - viewed, by me. Interpretation of Laboratory Data incorporated into ED treatment  1. DVT (deep venous thrombosis), bilateral   2. Acute renal failure   3. Hyperkalemia   4. Noncompliance with medication regimen   5. Alcoholism   6. Renal failure (ARF), acute on chronic       MDM  Acute venous thromboembolism with likely pulmonary emboli. Associated renal insufficiency and hyperkalemia. Suspect problems are related to medical noncompliance. Patient will need to be admitted for stabilization.        Flint Melter, MD 11/17/12 [phone]

## 2012-11-17 NOTE — Progress Notes (Signed)
ANTICOAGULATION CONSULT NOTE - Initial Consult  Pharmacy Consult for Heparin Indication: pulmonary embolus  No Known Allergies  Patient Measurements: Height: 5\' 9"  (175.3 cm) Weight: 205 lb (92.987 kg) IBW/kg (Calculated) : 70.7 Heparin Dosing Weight: 90 kg  Vital Signs: Temp: 98.8 F (37.1 C) (03/17 1936) Temp src: Oral (03/17 1936) BP: 122/63 mmHg (03/17 1936) Pulse Rate: 120 (03/17 1936)  Labs:  Recent Labs  11/17/12 1519 11/17/12 1539 11/17/12 1545 11/17/12 1805  HGB 17.0 14.5  --   --   HCT 50.0 42.4  --   --   PLT  --  184  --   --   APTT  --   --  30  --   LABPROT  --   --  14.0  --   INR  --   --  1.09  --   CREATININE 3.10*  --  2.76* 2.74*  TROPONINI  --   --  <0.30  --     Estimated Creatinine Clearance: 30.3 ml/min (by C-G formula based on Cr of 2.74).   Medical History: Past Medical History  Diagnosis Date  . Hypertension   . DVT (deep venous thrombosis)     right leg  . Psoriasis     Assessment:  66 YOM presenting with SOB and elevated D-dimer to begin IV heparin for treatment of PE.  Baseline labs for anticoag (CBC, INR, PTT) drawn are WNL.   Pt with AKI: SCr 2.74, CrCl~30 ml/min.  Per MD progress note, pt was treated for DVT 10 months ago for which he completed a short treatment course of warfarin.  Goal of Therapy:  Heparin level 0.3-0.7 units/ml Monitor platelets by anticoagulation protocol: Yes   Plan:  Start IV heparin with 4500 unit bolus followed by infusion started at 1500 units/hr (15 mL/hr).   Check heparin level 6 hours after start of infusion (~0300 3/18). Daily heparin level and CBC.  Clance Boll 11/17/2012,8:09 PM

## 2012-11-17 NOTE — Progress Notes (Signed)
*  Preliminary Results* Bilateral lower extremity venous duplex completed. The right lower extremity is positive for deep vein thrombosis involving the right popliteal and posterior tibial veins, a The left lower extremity is positive for deep vein thrombosis involving the posterior tibial veins with mobile, occlusive, deep vein thrombosis of the left tibioperoneal trunk. No evidence of Baker's cyst bilaterally. Preliminary results discussed with Dr.Wentz.  11/17/2012 8:32 PM Gertie Fey, RDMS, RDCS

## 2012-11-17 NOTE — Progress Notes (Signed)
Attempted abg x 2 RT's, pt cannot stay still, very jerky movements in response to pain. RN made aware.

## 2012-11-18 ENCOUNTER — Encounter (HOSPITAL_COMMUNITY): Payer: Self-pay | Admitting: *Deleted

## 2012-11-18 ENCOUNTER — Inpatient Hospital Stay (HOSPITAL_COMMUNITY): Payer: Medicare Other

## 2012-11-18 LAB — TROPONIN I
Troponin I: <0.3 ng/mL (ref <0.30)
Troponin I: <0.3 ng/mL (ref <0.30)

## 2012-11-18 LAB — BASIC METABOLIC PANEL
BUN: 13 mg/dL (ref 6–23)
CO2: 17 mEq/L — ABNORMAL LOW (ref 19–32)
Calcium: 8.1 mg/dL — ABNORMAL LOW (ref 8.4–10.5)
Calcium: 8.2 mg/dL — ABNORMAL LOW (ref 8.4–10.5)
Calcium: 7.9 mg/dL — ABNORMAL LOW (ref 8.4–10.5)
Chloride: 101 mEq/L (ref 96–112)
Creatinine, Ser: 2.39 mg/dL — ABNORMAL HIGH (ref 0.50–1.35)
Creatinine, Ser: 2.4 mg/dL — ABNORMAL HIGH (ref 0.50–1.35)
GFR calc Af Amer: 31 mL/min — ABNORMAL LOW (ref >90)
GFR calc Af Amer: 28 mL/min — ABNORMAL LOW (ref >90)
GFR calc non Af Amer: 27 mL/min — ABNORMAL LOW (ref >90)
GFR calc non Af Amer: 27 mL/min — ABNORMAL LOW (ref >90)
GFR calc non Af Amer: 25 mL/min — ABNORMAL LOW (ref >90)
Glucose, Bld: 98 mg/dL (ref 70–99)
Potassium: 4.5 mEq/L (ref 3.5–5.1)
Sodium: 134 mEq/L — ABNORMAL LOW (ref 135–145)

## 2012-11-18 LAB — HEPARIN LEVEL (UNFRACTIONATED): Heparin Unfractionated: 0.82 IU/mL — ABNORMAL HIGH (ref 0.30–0.70)

## 2012-11-18 LAB — URINE CULTURE

## 2012-11-18 LAB — CBC
Platelets: 118 10*3/uL — ABNORMAL LOW (ref 150–400)
RBC: 3.95 MIL/uL — ABNORMAL LOW (ref 4.22–5.81)
WBC: 4.6 10*3/uL (ref 4.0–10.5)

## 2012-11-18 LAB — LACTIC ACID, PLASMA: Lactic Acid, Venous: 4 mmol/L — ABNORMAL HIGH (ref 0.5–2.2)

## 2012-11-18 MED ORDER — WARFARIN VIDEO: Freq: Once | Status: DC

## 2012-11-18 MED ORDER — HEPARIN (PORCINE) IN NACL 100-0.45 UNIT/ML-% IJ SOLN
1050.0000 [IU]/h | INTRAMUSCULAR | Status: DC
  Administered 2012-11-19 – 2012-11-21 (×3): 1050 [IU]/h via INTRAVENOUS
  Filled 2012-11-18 (×8): qty 250

## 2012-11-18 MED ORDER — PATIENT'S GUIDE TO USING COUMADIN BOOK
Freq: Once | Status: AC
  Administered 2012-11-18: 18:00:00
  Filled 2012-11-18: qty 1

## 2012-11-18 MED ORDER — TECHNETIUM TO 99M ALBUMIN AGGREGATED
5.5000 | Freq: Once | INTRAVENOUS | Status: AC | PRN
  Administered 2012-11-18: 6 via INTRAVENOUS

## 2012-11-18 MED ORDER — WARFARIN SODIUM 5 MG PO TABS
5.0000 mg | ORAL_TABLET | Freq: Once | ORAL | Status: AC
  Administered 2012-11-18: 5 mg via ORAL
  Filled 2012-11-18: qty 1

## 2012-11-18 MED ORDER — SODIUM BICARBONATE 8.4 % IV SOLN
INTRAVENOUS | Status: DC
  Administered 2012-11-18 (×2): via INTRAVENOUS
  Filled 2012-11-18 (×4): qty 1000

## 2012-11-18 MED ORDER — PANTOPRAZOLE SODIUM 40 MG PO TBEC
40.0000 mg | DELAYED_RELEASE_TABLET | Freq: Every day | ORAL | Status: DC
  Administered 2012-11-18 – 2012-11-24 (×7): 40 mg via ORAL
  Filled 2012-11-18 (×8): qty 1

## 2012-11-18 MED ORDER — WARFARIN - PHARMACIST DOSING INPATIENT: Freq: Every day | Status: DC

## 2012-11-18 MED ORDER — TECHNETIUM TC 99M DIETHYLENETRIAME-PENTAACETIC ACID: 41.0000 | Freq: Once | INTRAVENOUS | Status: AC | PRN

## 2012-11-18 MED ORDER — ENSURE COMPLETE PO LIQD
237.0000 mL | Freq: Two times a day (BID) | ORAL | Status: DC
  Administered 2012-11-19 – 2012-11-24 (×8): 237 mL via ORAL

## 2012-11-18 NOTE — Progress Notes (Addendum)
ANTICOAGULATION CONSULT NOTE - Follow Up Consult  Pharmacy Consult for Heparin, Coumadin Indication: pulmonary embolus and DVT  No Known Allergies  Patient Measurements: Height: 5\' 9"  (175.3 cm) Weight: 199 lb 15.3 oz (90.7 kg) IBW/kg (Calculated) : 70.7  Vital Signs: Temp: 98.3 F (36.8 C) (03/18 1200) Temp src: Oral (03/18 1200) BP: 131/86 mmHg (03/18 1255) Pulse Rate: 93 (03/18 1255)  Labs:  Recent Labs  11/17/12 1519 11/17/12 1539  11/17/12 1545 11/17/12 1805 11/17/12 2344 11/18/12 0043 11/18/12 0245 11/18/12 0525 11/18/12 1150  HGB 17.0 14.5  --   --   --   --   --   --  12.2*  --   HCT 50.0 42.4  --   --   --   --   --   --  35.6*  --   PLT  --  184  --   --   --   --   --   --  118*  --   APTT  --   --   --  30  --   --   --   --   --   --   LABPROT  --   --   --  14.0  --   --   --   --   --   --   INR  --   --   --  1.09  --   --   --   --   --   --   HEPARINUNFRC  --   --   --   --   --   --   --  0.59  --  0.80*  CREATININE 3.10*  --   --  2.76* 2.74*  --  2.39*  --  2.40*  --   TROPONINI  --   --   < > <0.30  --  <0.30  --   --  <0.30 <0.30  < > = values in this interval not displayed.  Estimated Creatinine Clearance: 34.2 ml/min (by C-G formula based on Cr of 2.4).   Medications:  Infusions:  . heparin 1,400 Units/hr (11/18/12 1330)  . [DISCONTINUED] sodium chloride 125 mL/hr at 11/17/12 1939  . [DISCONTINUED] sodium chloride 1,000 mL (11/17/12 2305)  . [DISCONTINUED] dextrose 5 % 1,000 mL with sodium bicarbonate 150 mEq infusion 125 mL/hr at 11/18/12 1058  . [DISCONTINUED] heparin 1,500 Units/hr (11/18/12 1058)    Assessment:  66 yom admit 3/17 with concern for DVT and PE. Dopplers confirmed bilateral lower extremity DVT, with mobile, occlusive DVT in left.  VQ scan showed low probability of PE.  Pharmacy asked to assist with IV Heparin dosing, started 3/17 pm and now starting Coumadin.  Per CHEST guidelines will need 5 day overlap and INR 2  or greater x 24hrs before stopping heparin.  INR 3/17 was 1.09.  Goal of Therapy:  INR 2-3 Heparin level 0.3-0.7 units/ml Monitor platelets by anticoagulation protocol: Yes   Plan:   Start with Coumadin 5mg  tonight. Patient doesn't recall previous doses of Coumadin.  F/u repeat heparin level at 8pm.  Daily heparin level, INR, and CBC.  Continue to monitor H&H and platelets.  Complete Coumadin education.  Charolotte Eke, PharmD, pager [phone]. 11/18/2012,3:43 PM.  Addendum:  A: Heparin level 0.82, remains above target range on 1400units/hr. No bleeding reported/documented.  P: Reduce heparin to 1050units/hr and f/u AM heparin level, ~8hrs from now.  Charolotte Eke, PharmD, pager [phone]. 11/18/2012,9:21 PM.

## 2012-11-18 NOTE — Progress Notes (Signed)
ANTICOAGULATION CONSULT NOTE - Follow Up Consult  Pharmacy Consult for Heparin Indication: pulmonary embolus and DVT  No Known Allergies  Patient Measurements: Height: 5\' 9"  (175.3 cm) Weight: 199 lb 15.3 oz (90.7 kg) IBW/kg (Calculated) : 70.7  Vital Signs: Temp: 97.6 F (36.4 C) (03/18 0800) Temp src: Oral (03/18 0800) BP: 149/89 mmHg (03/18 0748) Pulse Rate: 110 (03/18 0748)  Labs:  Recent Labs  11/17/12 1519 11/17/12 1539  11/17/12 1545 11/17/12 1805 11/17/12 2344 11/18/12 0043 11/18/12 0245 11/18/12 0525 11/18/12 1150  HGB 17.0 14.5  --   --   --   --   --   --  12.2*  --   HCT 50.0 42.4  --   --   --   --   --   --  35.6*  --   PLT  --  184  --   --   --   --   --   --  118*  --   APTT  --   --   --  30  --   --   --   --   --   --   LABPROT  --   --   --  14.0  --   --   --   --   --   --   INR  --   --   --  1.09  --   --   --   --   --   --   HEPARINUNFRC  --   --   --   --   --   --   --  0.59  --  0.80*  CREATININE 3.10*  --   --  2.76* 2.74*  --  2.39*  --  2.40*  --   TROPONINI  --   --   < > <0.30  --  <0.30  --   --  <0.30 <0.30  < > = values in this interval not displayed.  Estimated Creatinine Clearance: 34.2 ml/min (by C-G formula based on Cr of 2.4).   Medications:  Infusions:  . dextrose 5 % 1,000 mL with sodium bicarbonate 150 mEq infusion 125 mL/hr at 11/18/12 0052  . heparin 1,500 Units/hr (11/17/12 2304)  . [DISCONTINUED] sodium chloride 125 mL/hr at 11/17/12 1939  . [DISCONTINUED] sodium chloride 1,000 mL (11/17/12 2305)    Assessment:  66 yom admit 3/17 with concern for DVT and PE.  Pharmacy asked to assist with IV Heparin dosing, started 3/17 pm.  Dopplers 3/17 showed bilateral lower extremity DVT, with mobile, occlusive DVT in left.  VQ scan shows low probability of PE.  CBC:  Hgb 12.2, Plt 118.    Heparin level (0.8) is elevated above goal range.  Goal of Therapy:  Heparin level 0.3-0.7 units/ml Monitor platelets by  anticoagulation protocol: Yes   Plan:   Decrease to heparin IV infusion at 1400 units/hr  Heparin level 6 hours after change  Daily heparin level and CBC  Continue to monitor H&H and platelets  Lynann Beaver PharmD, BCPS Pager [phone] 11/18/2012 1:41 PM

## 2012-11-18 NOTE — Care Management Note (Signed)
    Page 1 of 1   11/18/2012     1:22:34 PM   CARE MANAGEMENT NOTE 11/18/2012  Patient:  Devin Williams, Devin Williams   Account Number:  [id number]  Date Initiated:  11/18/2012  Documentation initiated by:  Lorenda Ishihara  Subjective/Objective Assessment:   67 yo male admitted with dyspnea, K >6, bilateral DVT. PTA lived at home with spouse.     Action/Plan:   Home when stable   Anticipated DC Date:  11/21/2012   Anticipated DC Plan:  HOME/SELF CARE      DC Planning Services  CM consult      Choice offered to / List presented to:             Status of service:  Completed, signed off Medicare Important Message given?   (If response is "NO", the following Medicare IM given date fields will be blank) Date Medicare IM given:   Date Additional Medicare IM given:    Discharge Disposition:  HOME/SELF CARE  Per UR Regulation:  Reviewed for med. necessity/level of care/duration of stay  If discussed at Long Length of Stay Meetings, dates discussed:    Comments:

## 2012-11-18 NOTE — Progress Notes (Signed)
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 He also complained off right leg pain for which she had a Doppler of his legs bilaterally. Patient noted to have acute kidney injury with hyperkalemia and non-anion gap metabolic acidosis.  Assessment/Plan: Shortness of breath with bilateral DVTs Patient admitted to step down with concern for PE given tachycardia and shortness of breath. Sats currently maintained on room air. VQ scan done shows low probability for PE. Has proBNP all 3900. 2-D echo ordered. Does not appear to be volume overloaded clinically. She started on IV heparin drip for bilateral DVTs. It appears that he did not complete his course of Coumadin he had DVTs previously. -Coumadin per pharmacy consult  Acute kidney injury with hyperkalemia and bicarbonate No clear etiology. UA unremarkable. Chest on bicarbonate drip on admission and given Kayexalate. No EKG changes. Monitor repeat lites in the evening. Informs taking about 2 tablets of Motrin daily for past one week for his leg pain. Also on lisinopril. -Will check urine lites and serum uric acid. Check CPK. I will order for ultrasound of his kidneys.   Elevated lactate Repeat lactate level still 3.7  Although improved likely from admission.Patient does not have any GI symptoms. -Followup with his levels in the morning.  Alcohol use Informs drinking up to 1 pint 3 times a day. No signs of withdrawal. Continue CIWA   CAD status post CABG Asymptomatic. Serial cardiac enzymes negative. No EKG changes  Hypertension Holding lisinopril given KK I. and amlodipine given borderline blood pressure on admission..    - Code Status:  Full code Family Communication: None at bedside Disposition Plan: Likely home once stable   Consultants:  None  Procedures:  None   Antibiotics:  *None  HPI/Subjective: Patient seen and examined this morning. Admission H&P reviewed. Complains of dyspnea on exertion. Denies any nausea or vomiting.  Objective: Filed Vitals:   11/18/12 0700 11/18/12 0748 11/18/12 0800 11/18/12 1255  BP: 130/84 149/89  131/86  Pulse: 105 110  93  Temp:   97.6 F (36.4 C)   TempSrc:   Oral   Resp: 20 19  21   Height:      Weight:      SpO2: 100% 99%  99%    Intake/Output Summary (Last 24 hours) at 11/18/12 1501 Last data filed at 11/18/12 1300  Gross per 24 hour  Intake   1926 ml  Output    175 ml  Net   1751 ml   Filed Weights   11/17/12 1936 11/17/12 2300  Weight: 92.987 kg (205 lb) 90.7 kg (199 lb 15.3 oz)    Exam:   General:  Elderly male lying in bed in no acute distress  HEENT: No pallor, moist oral mucosa, no JVD  Cardiovascular: S1 and S2 tachycardic, no murmurs rub or gallop  Respiratory: Clear to auscultation bilaterally, no added sounds  Abdomen: Soft, nontender, nondistended, bowel sounds present  Musculoskeletal:  Warm, trace edema, no swelling or tenderness  CNS: AAO x3    Data Reviewed: Basic Metabolic Panel:  Recent Labs Lab 11/17/12 1519 11/17/12 1545 11/17/12 1805 11/18/12 0043 11/18/12 0525  NA 134* 129* 133* 133* 134*  K 6.6* 6.5* 6.2* 6.1* 5.7*  CL 106 95* 100 104 101  CO2  --  16* 18* 17* 22  GLUCOSE 167* 159* 110* 98 94  BUN 21 15 15 13 13   CREATININE 3.10* 2.76* 2.74* 2.39* 2.40*  CALCIUM  --  8.8 8.8 8.1* 8.2*   Liver Function Tests:  Recent Labs Lab 11/17/12 1545 11/17/12 1805  AST 149* 111*  ALT 62* 50  ALKPHOS 184* 163*  BILITOT 0.6 0.6  PROT 8.0 6.7  ALBUMIN 3.1* 2.7*   No results found for this basename: LIPASE, AMYLASE,  in the last 168 hours No results found for this basename: AMMONIA,  in the last 168  hours CBC:  Recent Labs Lab 11/17/12 1519 11/17/12 1539 11/18/12 0525  WBC  --  5.2 4.6  NEUTROABS  --  3.6  --   HGB 17.0 14.5 12.2*  HCT 50.0 42.4 35.6*  MCV  --  91.6 90.1  PLT  --  184 118*   Cardiac Enzymes:  Recent Labs Lab 11/17/12 1545 11/17/12 2344 11/18/12 0525 11/18/12 1150  TROPONINI <0.30 <0.30 <0.30 <0.30   BNP (last 3 results)  Recent Labs  11/17/12 1545  PROBNP 3909.0*   CBG: No results found for this basename: GLUCAP,  in the last 168 hours  Recent Results (from the past 240 hour(s))  MRSA PCR SCREENING     Status: None   Collection Time    11/18/12  1:06 AM      Result Value Range Status   MRSA by PCR NEGATIVE  NEGATIVE Final   Comment:            The GeneXpert MRSA Assay (FDA     approved for NASAL specimens     only), is one component of a     comprehensive MRSA colonization     surveillance program. It is not     intended to diagnose MRSA     infection nor to guide or     monitor treatment for     MRSA infections.     Studies: Dg Chest 2 View  11/17/2012  *RADIOLOGY REPORT*  Clinical Data: Cough  CHEST - 2 VIEW  Comparison: 07/03/2009  Findings: Postop CABG.  Negative for heart failure.  Lungs are clear without infiltrate or effusion.  Multiple healed chronic rib fractures.  IMPRESSION: No acute cardiopulmonary abnormality.   Original Report Authenticated By: [name], [name]    Nm Pulmonary Perf And Vent  11/18/2012  *RADIOLOGY REPORT*  Clinical Data: Shortness of breath  NM PULMONARY VENTILATION AND PERFUSION SCAN  Views:  Anterior, posterior, right lateral, left lateral, RAO, LAO, RPO, LPO - ventilation and perfusion  Radiopharmaceutical: Technetium 50m DTPA - ventilation; technetium 54m macroaggregated albumin - perfusion  Dose:  41.0 mCi - ventilation; 5.5 mCi - perfusion  Route of administration:  Inhalation - ventilation; intravenous - perfusion  Comparison: Chest radiograph November 17, 2012  Findings:  The ventilation study shows  homogeneous and symmetric uptake of radiotracer bilaterally.  On the perfusion study, there are no segmental perfusion defects. There is photopenia in the major fissures bilaterally.  This is not a finding that is typical for pulmonary embolus, and is more suggestive of fluid in these areas.  IMPRESSION:  Question fluid of both major fissures, a finding not seen on recent chest radiograph.  There are no appreciable segmental ventilation / perfusion mismatches on this study.  This study is felt to constitute an overall low probability of pulmonary embolus.   Original Report Authenticated By:  Bretta Bang, [name]     Scheduled Meds: . acitretin  25 mg Oral QAC breakfast  . aspirin EC  81 mg Oral Daily  . atorvastatin  20 mg Oral QHS  . feeding supplement  237 mL Oral BID BM  . fenofibrate  160 mg Oral Daily  . folic acid  1 mg Oral Daily  . hydrocortisone cream  1 application Topical BID  . LORazepam  0-4 mg Oral Q6H   Followed by  . [START ON 11/20/2012] LORazepam  0-4 mg Oral Q12H  . metoprolol  50 mg Oral BID  . multivitamin with minerals  1 tablet Oral Daily  . sodium chloride  3 mL Intravenous Q12H  . sodium polystyrene  30 g Oral Once  . thiamine  100 mg Oral Daily   Or  . thiamine  100 mg Intravenous Daily   Continuous Infusions: . dextrose 5 % 1,000 mL with sodium bicarbonate 150 mEq infusion 125 mL/hr at 11/18/12 1058  . heparin       Time spent: 35 minutes    Virgen Belland  [facility] Pager [phone] If 7PM-7AM, please contact night-coverage at [email or web address], password [facility] 11/18/2012, 3:01 PM  LOS: 1 day

## 2012-11-18 NOTE — Progress Notes (Signed)
ANTICOAGULATION CONSULT NOTE - Initial Consult  Pharmacy Consult for Heparin Indication: pulmonary embolus  No Known Allergies  Patient Measurements: Height: 5\' 9"  (175.3 cm) Weight: 199 lb 15.3 oz (90.7 kg) IBW/kg (Calculated) : 70.7 Heparin Dosing Weight: 90 kg  Vital Signs: Temp: 97.4 F (36.3 C) (03/17 2300) Temp src: Oral (03/17 2300) BP: 126/81 mmHg (03/17 2300) Pulse Rate: 126 (03/18 0052)  Labs:  Recent Labs  11/17/12 1519 11/17/12 1539 11/17/12 1545 11/17/12 1805 11/17/12 2344 11/18/12 0043 11/18/12 0245  HGB 17.0 14.5  --   --   --   --   --   HCT 50.0 42.4  --   --   --   --   --   PLT  --  184  --   --   --   --   --   APTT  --   --  30  --   --   --   --   LABPROT  --   --  14.0  --   --   --   --   INR  --   --  1.09  --   --   --   --   HEPARINUNFRC  --   --   --   --   --   --  0.59  CREATININE 3.10*  --  2.76* 2.74*  --  2.39*  --   TROPONINI  --   --  <0.30  --  <0.30  --   --     Estimated Creatinine Clearance: 34.3 ml/min (by C-G formula based on Cr of 2.39).   Medical History: Past Medical History  Diagnosis Date  . Hypertension   . DVT (deep venous thrombosis)     right leg  . Psoriasis   . Chronic kidney disease   . Coronary artery disease   . Hyperlipidemia     Assessment:  78 YOM presenting with SOB and elevated D-dimer to begin IV heparin for treatment of PE.  Baseline labs for anticoag (CBC, INR, PTT) drawn are WNL.   Pt with AKI: SCr 2.74, CrCl~30 ml/min.  Per MD progress note, pt was treated for DVT 10 months ago for which he completed a short treatment course of warfarin.  HL= 0.59, no problems reported per RN.  Goal of Therapy:  Heparin level 0.3-0.7 units/ml Monitor platelets by anticoagulation protocol: Yes   Plan:   Continue heparin drip @ 1500 units/hr  Recheck HL @ 11am (with next troponin)  Daily heparin level and CBC.  Lorenza Evangelist 11/18/2012,3:25 AM

## 2012-11-18 NOTE — Plan of Care (Signed)
Problem: Phase I Progression Outcomes Goal: Initial discharge plan identified Outcome: Completed/Met Date Met:  11/18/12 Lives at home with wife

## 2012-11-18 NOTE — Progress Notes (Signed)
INITIAL NUTRITION ASSESSMENT  DOCUMENTATION CODES Per approved criteria  -Not Applicable   INTERVENTION: Recommend liberalizing diet to Regular due to poor po intake Continue daily Multivitamin with minerals Provide Ensure Complete po BID, each supplement provides 350 kcal and 13 grams of protein.   NUTRITION DIAGNOSIS: Inadequate oral intake related to decreased appetite as evidenced by pt report of eating 10% of meals.   Goal: Pt to meet >/= 90% of their estimated nutrition needs  Monitor:  PO intake Wt Labs  Reason for Assessment: MST  67 y.o. male  Admitting Dx: DVT (deep venous thrombosis)  ASSESSMENT: 67 y.o. male history of the DVT diagnosed last May and has not been compliant with his medications presents with complaints of increasing shortness of breath over the last 3-4 days. Dopplers of the lower extremities showed bilateral lower extremity DVT with mobile clot. Pt reports that he had a good appetite and was eating 3 meals daily until about a week ago he had a decrease in appetite and has been eating much less for the past week. Pt reports eating 10% of meals since admission. Pt reports usual body weight at 200 lbs. Pt does not follow any special diet at home.    Height: Ht Readings from Last 1 Encounters:  11/17/12 5\' 9"  (1.753 m)    Weight: Wt Readings from Last 1 Encounters:  11/17/12 199 lb 15.3 oz (90.7 kg)    Ideal Body Weight: 160 lb  % Ideal Body Weight: 124%  Wt Readings from Last 10 Encounters:  11/17/12 199 lb 15.3 oz (90.7 kg)    Usual Body Weight: 200 lbs  % Usual Body Weight: 99.5%  BMI:  Body mass index is 29.52 kg/(m^2).  Estimated Nutritional Needs: Kcal: 2090-2440 Protein: 73-91 grams Fluid: 2.5 L  Skin: dry, flaky, itching per nursing notes  Diet Order: Cardiac  EDUCATION NEEDS: -No education needs identified at this time   Intake/Output Summary (Last 24 hours) at 11/18/12 1240 Last data filed at 11/18/12 0600  Gross per 24 hour  Intake    946 ml  Output    175 ml  Net    771 ml    Last BM: 3/17  Labs:   Recent Labs Lab 11/17/12 1805 11/18/12 0043 11/18/12 0525  NA 133* 133* 134*  K 6.2* 6.1* 5.7*  CL 100 104 101  CO2 18* 17* 22  BUN 15 13 13   CREATININE 2.74* 2.39* 2.40*  CALCIUM 8.8 8.1* 8.2*  GLUCOSE 110* 98 94    CBG (last 3)  No results found for this basename: GLUCAP,  in the last 72 hours  Scheduled Meds: . acitretin  25 mg Oral QAC breakfast  . aspirin EC  81 mg Oral Daily  . atorvastatin  20 mg Oral QHS  . fenofibrate  160 mg Oral Daily  . folic acid  1 mg Oral Daily  . hydrocortisone cream  1 application Topical BID  . LORazepam  0-4 mg Oral Q6H   Followed by  . [START ON 11/20/2012] LORazepam  0-4 mg Oral Q12H  . metoprolol  50 mg Oral BID  . multivitamin with minerals  1 tablet Oral Daily  . sodium chloride  3 mL Intravenous Q12H  . sodium polystyrene  30 g Oral Once  . thiamine  100 mg Oral Daily   Or  . thiamine  100 mg Intravenous Daily    Continuous Infusions: . dextrose 5 % 1,000 mL with sodium bicarbonate 150 mEq infusion 125 mL/hr  at 11/18/12 1058  . heparin 1,500 Units/hr (11/18/12 1058)    Past Medical History  Diagnosis Date  . Hypertension   . DVT (deep venous thrombosis)     right leg  . Psoriasis   . Chronic kidney disease   . Coronary artery disease   . Hyperlipidemia     Past Surgical History  Procedure Laterality Date  . Cardiac surgery    . Knee surgery    . Appendectomy    . Coronary artery bypass graft      Ian Malkin RD, LDN Inpatient Clinical Dietitian Pager: [phone] After Hours Pager: [phone]

## 2012-11-19 ENCOUNTER — Inpatient Hospital Stay (HOSPITAL_COMMUNITY): Payer: Medicare Other

## 2012-11-19 LAB — BASIC METABOLIC PANEL
BUN: 12 mg/dL (ref 6–23)
Chloride: 98 mEq/L (ref 96–112)
Creatinine, Ser: 2.82 mg/dL — ABNORMAL HIGH (ref 0.50–1.35)
GFR calc Af Amer: 25 mL/min — ABNORMAL LOW (ref >90)
GFR calc non Af Amer: 22 mL/min — ABNORMAL LOW (ref >90)
Potassium: 4.4 mEq/L (ref 3.5–5.1)

## 2012-11-19 LAB — PROTIME-INR: Prothrombin Time: 15.9 seconds — ABNORMAL HIGH (ref 11.6–15.2)

## 2012-11-19 LAB — CBC
HCT: 32.1 % — ABNORMAL LOW (ref 39.0–52.0)
MCHC: 34.3 g/dL (ref 30.0–36.0)
Platelets: 119 10*3/uL — ABNORMAL LOW (ref 150–400)
RDW: 13.7 % (ref 11.5–15.5)
WBC: 4.4 10*3/uL (ref 4.0–10.5)

## 2012-11-19 LAB — RAPID URINE DRUG SCREEN, HOSP PERFORMED
Amphetamines: NOT DETECTED
Barbiturates: NOT DETECTED
Benzodiazepines: NOT DETECTED

## 2012-11-19 LAB — HEPARIN LEVEL (UNFRACTIONATED)
Heparin Unfractionated: 0.63 IU/mL (ref 0.30–0.70)
Heparin Unfractionated: 0.45 IU/mL (ref 0.30–0.70)

## 2012-11-19 MED ORDER — SODIUM CHLORIDE 0.9 % IV SOLN
INTRAVENOUS | Status: DC
  Administered 2012-11-19 – 2012-11-24 (×9): via INTRAVENOUS

## 2012-11-19 MED ORDER — WARFARIN SODIUM 5 MG PO TABS
5.0000 mg | ORAL_TABLET | Freq: Once | ORAL | Status: AC
  Administered 2012-11-19: 5 mg via ORAL
  Filled 2012-11-19: qty 1

## 2012-11-19 NOTE — Progress Notes (Signed)
Per ultrasound tech  patient is to be NPO for six hours in order to perform abdominal ultrasound. NPO started at 0900.

## 2012-11-19 NOTE — Progress Notes (Signed)
ANTICOAGULATION CONSULT NOTE - Follow Up Consult  Pharmacy Consult for Heparin, Warfarin Indication: pulmonary embolus  No Known Allergies  Patient Measurements: Height: 5\' 9"  (175.3 cm) Weight: 199 lb 15.3 oz (90.7 kg) IBW/kg (Calculated) : 70.7  Vital Signs: Temp: 99.2 F (37.3 C) (03/19 0400) Temp src: Oral (03/19 0400) BP: 111/76 mmHg (03/19 0400) Pulse Rate: 98 (03/19 0600)  Labs:  Recent Labs  11/17/12 1519 11/17/12 1539  11/17/12 1545  11/17/12 2344  11/18/12 0525 11/18/12 1150 11/18/12 1644 11/18/12 1955 11/19/12 0340  HGB 17.0 14.5  --   --   --   --   --  12.2*  --   --   --  11.0*  HCT 50.0 42.4  --   --   --   --   --  35.6*  --   --   --  32.1*  PLT  --  184  --   --   --   --   --  118*  --   --   --  119*  APTT  --   --   --  30  --   --   --   --   --   --   --   --   LABPROT  --   --   --  14.0  --   --   --   --   --   --   --  15.9*  INR  --   --   --  1.09  --   --   --   --   --   --   --  1.30  HEPARINUNFRC  --   --   --   --   --   --   < >  --  0.80*  --  0.82* 0.63  CREATININE 3.10*  --   --  2.76*  < >  --   < > 2.40*  --  2.57*  --  2.82*  CKTOTAL  --   --   --   --   --   --   --   --   --  75  --   --   TROPONINI  --   --   < > <0.30  --  <0.30  --  <0.30 <0.30  --   --   --   < > = values in this interval not displayed.  Estimated Creatinine Clearance: 29.1 ml/min (by C-G formula based on Cr of 2.82).   Medications:  Scheduled:  . acitretin  25 mg Oral QAC breakfast  . aspirin EC  81 mg Oral Daily  . atorvastatin  20 mg Oral QHS  . feeding supplement  237 mL Oral BID BM  . fenofibrate  160 mg Oral Daily  . folic acid  1 mg Oral Daily  . hydrocortisone cream  1 application Topical BID  . LORazepam  0-4 mg Oral Q6H   Followed by  . [START ON 11/20/2012] LORazepam  0-4 mg Oral Q12H  . metoprolol  50 mg Oral BID  . multivitamin with minerals  1 tablet Oral Daily  . pantoprazole  40 mg Oral Daily  . [COMPLETED] patient's guide to  using coumadin book   Does not apply Once  . sodium chloride  3 mL Intravenous Q12H  . sodium polystyrene  30 g Oral Once  . thiamine  100 mg Oral Daily   Or  .  thiamine  100 mg Intravenous Daily  . [COMPLETED] warfarin  5 mg Oral ONCE-1800  . warfarin   Does not apply Once  . Warfarin - Pharmacist Dosing Inpatient   Does not apply q1800   Infusions:  . heparin 1,050 Units/hr (11/18/12 2130)  . [DISCONTINUED] dextrose 5 % 1,000 mL with sodium bicarbonate 150 mEq infusion 125 mL/hr at 11/18/12 1058  . [DISCONTINUED] heparin 1,500 Units/hr (11/18/12 1058)    Assessment:  66 yom admit 3/17 with concern for DVT and PE.  Pharmacy asked to assist with IV Heparin dosing, started 3/17 pm.  Dopplers 3/17 showed bilateral lower extremity DVT, with mobile, occlusive DVT in left.  VQ scan shows low probability of PE.  CBC:  Hgb (11) decreased , Plt (119) stable.   No bleeding reported.  Heparin level (0.63) is therapeutic.  Heparin infusing at 1050 units/hr.  INR (1.3) remains subtherapeutic, but increased after first warfarin dose. Patient will need a minimum of 5 days warfarin / heparin overlap and until INR > 2 for 24 hours.   Goal of Therapy:  INR 2-3 Heparin level 0.3-0.7 units/ml Monitor platelets by anticoagulation protocol: Yes   Plan:   Warfarin 5mg  PO today at 1800 x1  Continue heparin IV infusion at 1050 units/hr  Heparin level 8 hours to confirm therapeutic level  Daily heparin level and CBC  Continue to monitor H&H and platelets  Lynann Beaver PharmD, BCPS Pager [phone] 11/19/2012 7:54 AM

## 2012-11-19 NOTE — Progress Notes (Signed)
ANTICOAGULATION CONSULT NOTE - Follow Up Consult  Pharmacy Consult for Heparin/Warfarin Indication: pulmonary embolus  No Known Allergies  Patient Measurements: Height: 5\' 9"  (175.3 cm) Weight: 199 lb 15.3 oz (90.7 kg) IBW/kg (Calculated) : 70.7   Vital Signs: Temp: 98.4 F (36.9 C) (03/19 1508) Temp src: Oral (03/19 1508) BP: 116/70 mmHg (03/19 1508) Pulse Rate: 89 (03/19 1508)  Labs:  Recent Labs  11/17/12 1519 11/17/12 1539  11/17/12 1545  11/17/12 2344  11/18/12 0525 11/18/12 1150 11/18/12 1644 11/18/12 1955 11/19/12 0340 11/19/12 1018 11/19/12 1604  HGB 17.0 14.5  --   --   --   --   --  12.2*  --   --   --  11.0*  --   --   HCT 50.0 42.4  --   --   --   --   --  35.6*  --   --   --  32.1*  --   --   PLT  --  184  --   --   --   --   --  118*  --   --   --  119*  --   --   APTT  --   --   --  30  --   --   --   --   --   --   --   --   --   --   LABPROT  --   --   --  14.0  --   --   --   --   --   --   --  15.9*  --   --   INR  --   --   --  1.09  --   --   --   --   --   --   --  1.30  --   --   HEPARINUNFRC  --   --   --   --   --   --   < >  --  0.80*  --  0.82* 0.63  --  0.45  CREATININE 3.10*  --   --  2.76*  < >  --   < > 2.40*  --  2.57*  --  2.82*  --   --   CKTOTAL  --   --   --   --   --   --   --   --   --  75  --   --  69  --   TROPONINI  --   --   < > <0.30  --  <0.30  --  <0.30 <0.30  --   --   --   --   --   < > = values in this interval not displayed.  Estimated Creatinine Clearance: 29.1 ml/min (by C-G formula based on Cr of 2.82).   Medications:  Scheduled:  . acitretin  25 mg Oral QAC breakfast  . aspirin EC  81 mg Oral Daily  . atorvastatin  20 mg Oral QHS  . feeding supplement  237 mL Oral BID BM  . fenofibrate  160 mg Oral Daily  . folic acid  1 mg Oral Daily  . hydrocortisone cream  1 application Topical BID  . LORazepam  0-4 mg Oral Q6H   Followed by  . [START ON 11/20/2012] LORazepam  0-4 mg Oral Q12H  . metoprolol  50 mg  Oral BID  . multivitamin with  minerals  1 tablet Oral Daily  . pantoprazole  40 mg Oral Daily  . [COMPLETED] patient's guide to using coumadin book   Does not apply Once  . sodium chloride  3 mL Intravenous Q12H  . sodium polystyrene  30 g Oral Once  . thiamine  100 mg Oral Daily   Or  . thiamine  100 mg Intravenous Daily  . [COMPLETED] warfarin  5 mg Oral ONCE-1800  . warfarin  5 mg Oral ONCE-1800  . warfarin   Does not apply Once  . Warfarin - Pharmacist Dosing Inpatient   Does not apply q1800   Infusions:  . sodium chloride 75 mL/hr at 11/19/12 1106  . heparin 1,050 Units/hr (11/19/12 0900)   PRN: acetaminophen, acetaminophen, diphenhydrAMINE, LORazepam, LORazepam, ondansetron (ZOFRAN) IV, ondansetron, [EXPIRED] technetium TC 56M diethylenetriame-pentaacetic acid  Assessment:  66 yom admit 3/17 with concern for DVT and PE. Pharmacy asked to assist with IV Heparin dosing, started 3/17 pm.   Dopplers 3/17 showed bilateral lower extremity DVT, with mobile, occlusive DVT in left. VQ scan shows low probability of PE.   CBC: Hgb (11) decreased , Plt (119) stable. No bleeding reported.  Heparin level= 0.45 drawn at 1600 is therapeutic. Heparin infusing at 1050 Units/hour   Goal of Therapy:  INR 2-3 Heparin level 0.3-0.7 units/ml Monitor platelets by anticoagulation protocol: Yes   Plan:   Warfarin 5mg  PO today at 1800 x1  Continue Heparin IV at 1050 Units/hour  Follow with AM labs  Daily Heparin level and CBC  Continue to monitor H&H and platelets  Loletta Specter 11/19/2012,5:17 PM

## 2012-11-19 NOTE — Progress Notes (Signed)
[facility] PROGRESS NOTE  Devin Williams ZOX:[id number] DOB: 1946-05-12 DOA: 11/17/2012 PCP: No primary provider on file.  Assessment/Plan: Shortness of breath with bilateral DVTs  Patient admitted to step down with concern for PE given tachycardia- and shortness of breath.  -Oxygen saturation 99-100% on room air -VQ scan low probability PE -Continue heparin drip -Continue warfarin -Patient took only 2 months of warfarin from his prior episodes of DVT -Coumadin per pharmacy consult  Acute on chronic renal failure  -Abdominal ultrasound to evaluate liver, spleen, kidneys -Random urine sodium and urine creatinine -Likely multifactorial including use of ibuprofen and lisinopril -Informs taking about 2 tablets of Motrin daily for past one week for his leg pain. -Restart IV fluids -Hyperkalemia  -Improved with Kayexalate and IVF -Continue telemetry  UA unremarkable.  Elevated proBNP -Clinically euvolemic -Give fluid challenge -Echocardiogram EF 45-50%, grade 1 diastolic dysfunction, moderately dilated RV Elevated lactate  -Recheck lactic acid Repeat lactate level still 3.7 Although improved likely from admission.Patient does not have any GI symptoms.  Alcohol use/abuse Informs drinking up to 1 pint 3 times a day. No signs of withdrawal. Continue CIWA  -Continue thiamine -Check urine drug screen CAD status post CABG  Asymptomatic. Serial cardiac enzymes negative. No EKG changes  Hypertension  Holding lisinopril given AKI -Continue metoprolol tartrate Thrombocytopenia -Likely due to a combination of DVT as well as suspected  underlying alcoholic cirrhosis -Continue to monitor      Family Communication:   Wife at beside Disposition Plan:   Home when medically stable      Procedures/Studies: Dg Chest 2 View  11/17/2012  *RADIOLOGY REPORT*  Clinical Data: Cough  CHEST - 2 VIEW  Comparison: 07/03/2009  Findings: Postop CABG.  Negative for heart failure.  Lungs  are clear without infiltrate or effusion.  Multiple healed chronic rib fractures.  IMPRESSION: No acute cardiopulmonary abnormality.   Original Report Authenticated By: [name], [name]    Nm Pulmonary Perf And Vent  11/18/2012  *RADIOLOGY REPORT*  Clinical Data: Shortness of breath  NM PULMONARY VENTILATION AND PERFUSION SCAN  Views:  Anterior, posterior, right lateral, left lateral, RAO, LAO, RPO, LPO - ventilation and perfusion  Radiopharmaceutical: Technetium 33m DTPA - ventilation; technetium 78m macroaggregated albumin - perfusion  Dose:  41.0 mCi - ventilation; 5.5 mCi - perfusion  Route of administration:  Inhalation - ventilation; intravenous - perfusion  Comparison: Chest radiograph November 17, 2012  Findings:  The ventilation study shows homogeneous and symmetric uptake of radiotracer bilaterally.  On the perfusion study, there are no segmental perfusion defects. There is photopenia in the major fissures bilaterally.  This is not a finding that is typical for pulmonary embolus, and is more suggestive of fluid in these areas.  IMPRESSION:  Question fluid of both major fissures, a finding not seen on recent chest radiograph.  There are no appreciable segmental ventilation / perfusion mismatches on this study.  This study is felt to constitute an overall low probability of pulmonary embolus.   Original Report Authenticated By: Bretta Bang, [name]          Subjective: Patient feels like he is breathing better. He denies any fevers, chills, chest pain, shortness breath, nausea, vomiting, diarrhea, abdominal pain, dysuria, hematuria. Denies PND or orthopnea.  Objective: Filed Vitals:   11/18/12 1933 11/19/12 0000 11/19/12 0400 11/19/12 0600  BP: 94/68 123/76 111/76   Pulse: 106 88  98  Temp: 98 F (36.7 C) 98.7 F (37.1 C) 99.2 F (37.3 C)  TempSrc: Oral Oral Oral   Resp:  20 20   Height:      Weight:      SpO2:  100% 100%     Intake/Output Summary (Last 24 hours) at 11/19/12  0851 Last data filed at 11/19/12 0600  Gross per 24 hour  Intake 2878.75 ml  Output      0 ml  Net 2878.75 ml   Weight change:  Exam:   General:  Pt is alert, follows commands appropriately, not in acute distress  HEENT: No icterus, No thrush, No neck mass, [place]/AT  Cardiovascular: RRR, S1/S2, no rubs, no gallops  Respiratory: CTA bilaterally, no wheezing, no crackles, no rhonchi  Abdomen: Soft/+BS, non tender, non distended, no guarding  Extremities: 1+ edema, No lymphangitis, No petechiae, No rashes, no synovitis  Data Reviewed: Basic Metabolic Panel:  Recent Labs Lab 11/17/12 1805 11/18/12 0043 11/18/12 0525 11/18/12 1644 11/19/12 0340  NA 133* 133* 134* 134* 132*  K 6.2* 6.1* 5.7* 4.5 4.4  CL 100 104 101 99 98  CO2 18* 17* 22 27 26   GLUCOSE 110* 98 94 98 77  BUN 15 13 13 12 12   CREATININE 2.74* 2.39* 2.40* 2.57* 2.82*  CALCIUM 8.8 8.1* 8.2* 7.9* 8.1*   Liver Function Tests:  Recent Labs Lab 11/17/12 1545 11/17/12 1805  AST 149* 111*  ALT 62* 50  ALKPHOS 184* 163*  BILITOT 0.6 0.6  PROT 8.0 6.7  ALBUMIN 3.1* 2.7*   No results found for this basename: LIPASE, AMYLASE,  in the last 168 hours No results found for this basename: AMMONIA,  in the last 168 hours CBC:  Recent Labs Lab 11/17/12 1519 11/17/12 1539 11/18/12 0525 11/19/12 0340  WBC  --  5.2 4.6 4.4  NEUTROABS  --  3.6  --   --   HGB 17.0 14.5 12.2* 11.0*  HCT 50.0 42.4 35.6* 32.1*  MCV  --  91.6 90.1 89.7  PLT  --  184 118* 119*   Cardiac Enzymes:  Recent Labs Lab 11/17/12 1545 11/17/12 2344 11/18/12 0525 11/18/12 1150 11/18/12 1644  CKTOTAL  --   --   --   --  75  TROPONINI <0.30 <0.30 <0.30 <0.30  --    BNP: No components found with this basename: POCBNP,  CBG: No results found for this basename: GLUCAP,  in the last 168 hours  Recent Results (from the past 240 hour(s))  URINE CULTURE     Status: None   Collection Time    11/17/12  5:42 PM      Result Value Range  Status   Specimen Description URINE, RANDOM   Final   Special Requests NONE   Final   Culture  Setup Time 11/18/2012 02:11   Final   Colony Count 3,000 COLONIES/ML   Final   Culture INSIGNIFICANT GROWTH   Final   Report Status 11/18/2012 FINAL   Final  MRSA PCR SCREENING     Status: None   Collection Time    11/18/12  1:06 AM      Result Value Range Status   MRSA by PCR NEGATIVE  NEGATIVE Final   Comment:            The GeneXpert MRSA Assay (FDA     approved for NASAL specimens     only), is one component of a     comprehensive MRSA colonization     surveillance program. It is not     intended to diagnose MRSA  infection nor to guide or     monitor treatment for     MRSA infections.     Scheduled Meds: . acitretin  25 mg Oral QAC breakfast  . aspirin EC  81 mg Oral Daily  . atorvastatin  20 mg Oral QHS  . feeding supplement  237 mL Oral BID BM  . fenofibrate  160 mg Oral Daily  . folic acid  1 mg Oral Daily  . hydrocortisone cream  1 application Topical BID  . LORazepam  0-4 mg Oral Q6H   Followed by  . [START ON 11/20/2012] LORazepam  0-4 mg Oral Q12H  . metoprolol  50 mg Oral BID  . multivitamin with minerals  1 tablet Oral Daily  . pantoprazole  40 mg Oral Daily  . sodium chloride  3 mL Intravenous Q12H  . sodium polystyrene  30 g Oral Once  . thiamine  100 mg Oral Daily   Or  . thiamine  100 mg Intravenous Daily  . warfarin  5 mg Oral ONCE-1800  . warfarin   Does not apply Once  . Warfarin - Pharmacist Dosing Inpatient   Does not apply q1800   Continuous Infusions: . heparin 1,050 Units/hr (11/18/12 2130)     Ilanna Deihl, DO  [facility] Pager [phone]  If 7PM-7AM, please contact night-coverage [email or web address] Password [facility] 11/19/2012, 8:51 AM   LOS: 2 days

## 2012-11-20 DIAGNOSIS — N189 Chronic kidney disease, unspecified: Secondary | ICD-10-CM

## 2012-11-20 DIAGNOSIS — E872 Acidosis: Secondary | ICD-10-CM

## 2012-11-20 LAB — BASIC METABOLIC PANEL
BUN: 12 mg/dL (ref 6–23)
CO2: 22 mEq/L (ref 19–32)
Calcium: 7.8 mg/dL — ABNORMAL LOW (ref 8.4–10.5)
Creatinine, Ser: 3.11 mg/dL — ABNORMAL HIGH (ref 0.50–1.35)
GFR calc non Af Amer: 20 mL/min — ABNORMAL LOW (ref >90)
Glucose, Bld: 76 mg/dL (ref 70–99)

## 2012-11-20 LAB — CBC
HCT: 29 % — ABNORMAL LOW (ref 39.0–52.0)
MCHC: 33.8 g/dL (ref 30.0–36.0)
MCV: 90.3 fL (ref 78.0–100.0)
Platelets: 116 10*3/uL — ABNORMAL LOW (ref 150–400)
RDW: 13.8 % (ref 11.5–15.5)

## 2012-11-20 LAB — CREATININE, URINE, RANDOM: Creatinine, Urine: 232.8 mg/dL

## 2012-11-20 LAB — MAGNESIUM: Magnesium: 1.3 mg/dL — ABNORMAL LOW (ref 1.5–2.5)

## 2012-11-20 LAB — SODIUM, URINE, RANDOM: Sodium, Ur: 26 mEq/L

## 2012-11-20 MED ORDER — MAGNESIUM SULFATE IN D5W 10-5 MG/ML-% IV SOLN
1.0000 g | Freq: Once | INTRAVENOUS | Status: AC
  Administered 2012-11-20: 1 g via INTRAVENOUS
  Filled 2012-11-20: qty 100

## 2012-11-20 MED ORDER — METOPROLOL TARTRATE 25 MG PO TABS
25.0000 mg | ORAL_TABLET | Freq: Two times a day (BID) | ORAL | Status: DC
  Administered 2012-11-20 – 2012-11-24 (×9): 25 mg via ORAL
  Filled 2012-11-20 (×10): qty 1

## 2012-11-20 MED ORDER — CALCIUM CARBONATE ANTACID 500 MG PO CHEW
400.0000 mg | CHEWABLE_TABLET | Freq: Two times a day (BID) | ORAL | Status: DC | PRN
  Administered 2012-11-20 – 2012-11-24 (×3): 400 mg via ORAL
  Filled 2012-11-20 (×3): qty 2

## 2012-11-20 MED ORDER — VITAMINS A & D EX OINT
TOPICAL_OINTMENT | CUTANEOUS | Status: AC
  Administered 2012-11-20: 5
  Filled 2012-11-20: qty 20

## 2012-11-20 MED ORDER — ALUM & MAG HYDROXIDE-SIMETH 200-200-20 MG/5ML PO SUSP
30.0000 mL | Freq: Four times a day (QID) | ORAL | Status: DC | PRN
  Administered 2012-11-20 – 2012-11-24 (×5): 30 mL via ORAL
  Filled 2012-11-20 (×6): qty 30

## 2012-11-20 NOTE — Evaluation (Signed)
Physical Therapy Evaluation Patient Details Name: Devin Williams MRN: [id number] DOB: Apr 27, 1946 Today's Date: 11/20/2012 Time: 4098-1191 PT Time Calculation (min): 15 min  PT Assessment / Plan / Recommendation Clinical Impression  Pt admitted with SOB and DVTs.  Pt would benefit from acute PT services in order to improve independence with transfers and ambulation prior to d/c home with spouse.  Pt reports he will not need assistive device at home so will see acutely to improve to PLOF.    PT Assessment  Patient needs continued PT services    Follow Up Recommendations       Does the patient have the potential to tolerate intense rehabilitation      Barriers to Discharge        Equipment Recommendations  Rolling walker with 5" wheels;Other (comment) (however pt declines)    Recommendations for Other Services     Frequency Min 3X/week    Precautions / Restrictions     Pertinent Vitals/Pain Reported slight SOB during ambulation (unable to obtain SaO2)      Mobility  Bed Mobility Bed Mobility: Supine to Sit Supine to Sit: 6: Modified independent (Device/Increase time);HOB elevated Transfers Transfers: Stand to Sit;Sit to Stand Sit to Stand: 4: Min guard;With upper extremity assist;From bed Stand to Sit: 4: Min guard;With upper extremity assist;To chair/3-in-1 Ambulation/Gait Ambulation/Gait Assistance: 4: Min guard Ambulation Distance (Feet): 400 Feet Assistive device: Rolling walker Ambulation/Gait Assistance Details: pt agreeable to RW since he hasn't walked since admission  Gait Pattern: Step-through pattern;Trunk flexed Gait velocity: WFL General Gait Details: pt reported slight SOB however pulse not reading and no others available    Exercises     PT Diagnosis: Difficulty walking  PT Problem List: Decreased activity tolerance;Decreased mobility;Cardiopulmonary status limiting activity;Decreased knowledge of use of DME PT Treatment Interventions: Gait  training;Functional mobility training;Stair training;DME instruction;Therapeutic activities;Therapeutic exercise;Patient/family education   PT Goals Acute Rehab PT Goals PT Goal Formulation: With patient Time For Goal Achievement: 11/27/12 Potential to Achieve Goals: Good Pt will go Sit to Stand: with modified independence PT Goal: Sit to Stand - Progress: Goal set today Pt will go Stand to Sit: with modified independence PT Goal: Stand to Sit - Progress: Goal set today Pt will Ambulate: >150 feet;with modified independence PT Goal: Ambulate - Progress: Goal set today Pt will Go Up / Down Stairs: 3-5 stairs;with supervision PT Goal: Up/Down Stairs - Progress: Goal set today  Visit Information  Last PT Received On: 11/20/12 Assistance Needed: +1    Subjective Data  Subjective: I don't have any of that stuff because I don't need it (DME)   Prior Functioning  Home Living Lives With: Spouse Type of Home: House Home Access: Stairs to enter [employer] of Steps: did not state Home Layout: One level Home Adaptive Equipment: None Prior Function Level of Independence: Independent Communication Communication: No difficulties    Cognition  Cognition Overall Cognitive Status: Appears within functional limits for tasks assessed/performed Arousal/Alertness: Awake/alert Orientation Level: Appears intact for tasks assessed Behavior During Session: [facility] for tasks performed    Extremity/Trunk Assessment Right Upper Extremity Assessment RUE ROM/Strength/Tone: [facility] for tasks assessed Left Upper Extremity Assessment LUE ROM/Strength/Tone: [facility] for tasks assessed Right Lower Extremity Assessment RLE ROM/Strength/Tone: [facility] for tasks assessed Left Lower Extremity Assessment LLE ROM/Strength/Tone: [facility] for tasks assessed   Balance    End of Session PT - End of Session Activity Tolerance: Patient tolerated treatment well Patient left: in chair;with call bell/phone within reach;with  chair alarm set  Nurse Communication: Mobility status  GP     Sarinah Doetsch,KATHrine E 11/20/2012, 2:42 PM Zenovia Jarred, PT, DPT 11/20/2012 Pager: [phone]

## 2012-11-20 NOTE — Progress Notes (Addendum)
ANTICOAGULATION CONSULT NOTE - Follow Up Consult  Pharmacy Consult for Heparin/Warfarin Indication: pulmonary embolus  No Known Allergies  Patient Measurements: Height: 5\' 9"  (175.3 cm) Weight: 211 lb 10.3 oz (96 kg) IBW/kg (Calculated) : 70.7   Vital Signs: Temp: 99.1 F (37.3 C) (03/20 0359) Temp src: Oral (03/20 0359) BP: 101/63 mmHg (03/20 0359) Pulse Rate: 92 (03/20 0359)  Labs:  Recent Labs  11/17/12 1519  11/17/12 1545  11/17/12 2344  11/18/12 0525 11/18/12 1150 11/18/12 1644  11/19/12 0340 11/19/12 1018 11/19/12 1604 11/20/12 0507  HGB 17.0  < >  --   --   --   --  12.2*  --   --   --  11.0*  --   --  9.8*  HCT 50.0  < >  --   --   --   --  35.6*  --   --   --  32.1*  --   --  29.0*  PLT  --   < >  --   --   --   --  118*  --   --   --  119*  --   --  116*  APTT  --   --  30  --   --   --   --   --   --   --   --   --   --   --   LABPROT  --   --  14.0  --   --   --   --   --   --   --  15.9*  --   --  17.7*  INR  --   --  1.09  --   --   --   --   --   --   --  1.30  --   --  1.50*  HEPARINUNFRC  --   --   --   --   --   < >  --  0.80*  --   < > 0.63  --  0.45 0.45  CREATININE 3.10*  --  2.76*  < >  --   < > 2.40*  --  2.57*  --  2.82*  --   --  3.11*  CKTOTAL  --   --   --   --   --   --   --   --  75  --   --  69  --   --   TROPONINI  --   < > <0.30  --  <0.30  --  <0.30 <0.30  --   --   --   --   --   --   < > = values in this interval not displayed.  Estimated Creatinine Clearance: 27.1 ml/min (by C-G formula based on Cr of 3.11).   Medications:  Scheduled:  . acitretin  25 mg Oral QAC breakfast  . aspirin EC  81 mg Oral Daily  . atorvastatin  20 mg Oral QHS  . feeding supplement  237 mL Oral BID BM  . fenofibrate  160 mg Oral Daily  . folic acid  1 mg Oral Daily  . hydrocortisone cream  1 application Topical BID  . [EXPIRED] LORazepam  0-4 mg Oral Q6H   Followed by  . LORazepam  0-4 mg Oral Q12H  . metoprolol  50 mg Oral BID  . multivitamin  with minerals  1 tablet Oral Daily  . pantoprazole  40 mg Oral Daily  . sodium chloride  3 mL Intravenous Q12H  . sodium polystyrene  30 g Oral Once  . thiamine  100 mg Oral Daily   Or  . thiamine  100 mg Intravenous Daily  . [COMPLETED] warfarin  5 mg Oral ONCE-1800  . warfarin   Does not apply Once  . Warfarin - Pharmacist Dosing Inpatient   Does not apply q1800   Infusions:  . sodium chloride 75 mL/hr at 11/19/12 2351  . heparin 1,050 Units/hr (11/19/12 0900)   PRN: acetaminophen, acetaminophen, diphenhydrAMINE, LORazepam, LORazepam, ondansetron (ZOFRAN) IV, ondansetron  Assessment:  66 yom admit 3/17 with bilateral lower extremity DVT, with mobile, occlusive DVT in left. VQ scan shows low probability of PE.  Patient initated on IV heparin 3/17 and warfarin started 3/19, Today is D#2 warfarin/coumadin overlap.   Hgb trending down 9.8, Plt low/stable 116.  No bleeding reported.  Per discussion with Dr. Arbutus Leas, decreased hgb thought to be dilutional, low plt thought to be secondary to underlying alcoholic cirrhosis, continue heparin dosing   Heparin level (0.45) this AM is therapeutic. Heparin infusing at 1050 Units/hour   Goal of Therapy:  INR 2-3 Heparin level 0.3-0.7 units/ml Monitor platelets by anticoagulation protocol: Yes   Plan:   Warfarin 5 mg po x 1 at 1800   Continue Heparin IV at 1050 Units/hour - Recommend at least 5 days overlap with warfarin/heparin and until INR is > 2 for 24 hours.   Daily Heparin level and CBC  Continue to monitor H&H and platelets   Keeven Matty, Loma Messing PharmD Pager #: [phone] 7:18 AM 11/20/2012

## 2012-11-20 NOTE — Progress Notes (Addendum)
[facility] PROGRESS NOTE  Devin MEECH ZOX:[id number] DOB: 09/18/1945 DOA: 11/17/2012 PCP: No primary provider on file.  Assessment/Plan: Shortness of breath with bilateral DVTs  Patient admitted to step down with concern for PE given tachycardia- and shortness of breath.  -Oxygen saturation 95-100% on room air  -VQ scan low probability PE  -Continue heparin drip  -Continue warfarin--INR 1.50 -Patient took only 2 months of warfarin from his prior episodes of DVT  -Coumadin per pharmacy consult  -Suspect a component of deconditioning -PT evaluation Acute on chronic renal failure  -Abdominal ultrasound-reveals fatty liver without any hydronephrosis, normal spleen -Random urine sodium and urine creatinine--FeNa = 0.24% -Likely multifactorial including use of ibuprofen and lisinopril  -Informs taking about 2 tablets of Motrin daily for past one week for his leg pain.  -Continue IV fluids  -Hyperkalemia  -Improved with Kayexalate and IVF  -Continue telemetry UA unremarkable.  Elevated proBNP  -Clinically euvolemic  -Give fluid challenge--> continue fluids  -Echocardiogram EF 45-50%, grade 1 diastolic dysfunction, moderately dilated RV  -Likely due to chronic kidney disease Elevated lactate  -Recheck lactic acid--> 1.5 Repeat lactate level still 3.7 Although improved likely from admission.Patient does not have any GI symptoms.  -Suspect volume depletion induced Alcohol use/abuse  Informs drinking up to 1 pint 3 times a day. No signs of withdrawal. Continue CIWA  -Continue thiamine  -Check urine drug screen  CAD status post CABG  Asymptomatic. Serial cardiac enzymes negative. No EKG changes  Hypertension  Holding lisinopril given AKI  -Continue metoprolol tartrate  Thrombocytopenia  -Likely due to a combination of DVT as well as suspected underlying alcoholic cirrhosis  -Continue to monitor Hypomagnesemia -replete   Disposition Plan:   Home when medically  stable        Procedures/Studies: Dg Chest 2 View  11/17/2012  *RADIOLOGY REPORT*  Clinical Data: Cough  CHEST - 2 VIEW  Comparison: 07/03/2009  Findings: Postop CABG.  Negative for heart failure.  Lungs are clear without infiltrate or effusion.  Multiple healed chronic rib fractures.  IMPRESSION: No acute cardiopulmonary abnormality.   Original Report Authenticated By: [name], [name]    US Abdomen Complete  11/19/2012  *RADIOLOGY REPORT*  Clinical Data:  Acute renal failure.  All views.  COMPLETE ABDOMINAL ULTRASOUND  Comparison:  Renal ultrasound dated 11/14/2004  Findings:  Gallbladder:  Multiple gallstones.  No gallbladder wall thickening. Negative sonographic Murphy's sign.  Common bile duct:  Limited visualization of the common bile duct. The visualized segment is 6.1 mm in diameter.  Liver:  The liver is diffusely echogenic.  The left lobe is not well seen.  No mass lesions.  IVC:  Not visualized.  Pancreas:  Not visualized.  Spleen:  Normal.  6.5 cm.  Right Kidney:  Normal.  11.2 cm in length.  Left Kidney:  Normal.  10.8 cm in length.  Abdominal aorta:  A small segment was visualized in the mid abdomen and measures 2.2 cm in diameter.  IMPRESSION: Cholelithiasis.  Hepatic steatosis.  IVC and pancreas are not visible.  The kidneys appear normal and unchanged since the prior exam.   Original Report Authenticated By: [name], [name]    Nm Pulmonary Perf And Vent  11/18/2012  *RADIOLOGY REPORT*  Clinical Data: Shortness of breath  NM PULMONARY VENTILATION AND PERFUSION SCAN  Views:  Anterior, posterior, right lateral, left lateral, RAO, LAO, RPO, LPO - ventilation and perfusion  Radiopharmaceutical: Technetium 28m DTPA - ventilation; technetium 50m macroaggregated albumin - perfusion  Dose:  41.0 mCi - ventilation; 5.5 mCi - perfusion  Route of administration:  Inhalation - ventilation; intravenous - perfusion  Comparison: Chest radiograph November 17, 2012  Findings:  The ventilation study  shows homogeneous and symmetric uptake of radiotracer bilaterally.  On the perfusion study, there are no segmental perfusion defects. There is photopenia in the major fissures bilaterally.  This is not a finding that is typical for pulmonary embolus, and is more suggestive of fluid in these areas.  IMPRESSION:  Question fluid of both major fissures, a finding not seen on recent chest radiograph.  There are no appreciable segmental ventilation / perfusion mismatches on this study.  This study is felt to constitute an overall low probability of pulmonary embolus.   Original Report Authenticated By: Bretta Bang, [name]          Subjective: Patient denies fevers, chills, chest pain, shortness breath, nausea, vomiting, diarrhea, abdominal pain, dysuria, hematuria. No rashes. No dizziness.  Objective: Filed Vitals:   11/19/12 1725 11/19/12 2055 11/20/12 0359 11/20/12 0500  BP: 125/66 104/62 101/63   Pulse: 82 89 92   Temp:  98.7 F (37.1 C) 99.1 F (37.3 C)   TempSrc:  Oral Oral   Resp:  20 20   Height:      Weight:    96 kg (211 lb 10.3 oz)  SpO2:  97% 95%     Intake/Output Summary (Last 24 hours) at 11/20/12 0933 Last data filed at 11/20/12 0700  Gross per 24 hour  Intake   1218 ml  Output    200 ml  Net   1018 ml   Weight change:  Exam:   General:  Pt is alert, follows commands appropriately, not in acute distress  HEENT: No icterus, No thrush, [place]/AT  Cardiovascular: RRR, S1/S2, no rubs, no gallops  Respiratory: CTA bilaterally, no wheezing, no crackles, no rhonchi  Abdomen: Soft/+BS, non tender, non distended, no guarding  Extremities: trace edema, No lymphangitis, No petechiae, No rashes, no synovitis  Data Reviewed: Basic Metabolic Panel:  Recent Labs Lab 11/18/12 0043 11/18/12 0525 11/18/12 1644 11/19/12 0340 11/20/12 0507  NA 133* 134* 134* 132* 137  K 6.1* 5.7* 4.5 4.4 4.2  CL 104 101 99 98 104  CO2 17* 22 27 26 22   GLUCOSE 98 94 98 77 76  BUN 13  13 12 12 12   CREATININE 2.39* 2.40* 2.57* 2.82* 3.11*  CALCIUM 8.1* 8.2* 7.9* 8.1* 7.8*  MG  --   --   --   --  1.3*  PHOS  --   --   --   --  3.0   Liver Function Tests:  Recent Labs Lab 11/17/12 1545 11/17/12 1805  AST 149* 111*  ALT 62* 50  ALKPHOS 184* 163*  BILITOT 0.6 0.6  PROT 8.0 6.7  ALBUMIN 3.1* 2.7*   No results found for this basename: LIPASE, AMYLASE,  in the last 168 hours No results found for this basename: AMMONIA,  in the last 168 hours CBC:  Recent Labs Lab 11/17/12 1519 11/17/12 1539 11/18/12 0525 11/19/12 0340 11/20/12 0507  WBC  --  5.2 4.6 4.4 3.9*  NEUTROABS  --  3.6  --   --   --   HGB 17.0 14.5 12.2* 11.0* 9.8*  HCT 50.0 42.4 35.6* 32.1* 29.0*  MCV  --  91.6 90.1 89.7 90.3  PLT  --  184 118* 119* 116*   Cardiac Enzymes:  Recent Labs Lab 11/17/12 1545 11/17/12  2344 11/18/12 0525 11/18/12 1150 11/18/12 1644 11/19/12 1018  CKTOTAL  --   --   --   --  75 69  TROPONINI <0.30 <0.30 <0.30 <0.30  --   --    BNP: No components found with this basename: POCBNP,  CBG: No results found for this basename: GLUCAP,  in the last 168 hours  Recent Results (from the past 240 hour(s))  URINE CULTURE     Status: None   Collection Time    11/17/12  5:42 PM      Result Value Range Status   Specimen Description URINE, RANDOM   Final   Special Requests NONE   Final   Culture  Setup Time 11/18/2012 02:11   Final   Colony Count 3,000 COLONIES/ML   Final   Culture INSIGNIFICANT GROWTH   Final   Report Status 11/18/2012 FINAL   Final  MRSA PCR SCREENING     Status: None   Collection Time    11/18/12  1:06 AM      Result Value Range Status   MRSA by PCR NEGATIVE  NEGATIVE Final   Comment:            The GeneXpert MRSA Assay (FDA     approved for NASAL specimens     only), is one component of a     comprehensive MRSA colonization     surveillance program. It is not     intended to diagnose MRSA     infection nor to guide or     monitor  treatment for     MRSA infections.     Scheduled Meds: . acitretin  25 mg Oral QAC breakfast  . aspirin EC  81 mg Oral Daily  . atorvastatin  20 mg Oral QHS  . feeding supplement  237 mL Oral BID BM  . fenofibrate  160 mg Oral Daily  . folic acid  1 mg Oral Daily  . hydrocortisone cream  1 application Topical BID  . LORazepam  0-4 mg Oral Q12H  . metoprolol  25 mg Oral BID  . multivitamin with minerals  1 tablet Oral Daily  . pantoprazole  40 mg Oral Daily  . sodium chloride  3 mL Intravenous Q12H  . sodium polystyrene  30 g Oral Once  . thiamine  100 mg Oral Daily   Or  . thiamine  100 mg Intravenous Daily  . warfarin   Does not apply Once  . Warfarin - Pharmacist Dosing Inpatient   Does not apply q1800   Continuous Infusions: . sodium chloride 75 mL/hr at 11/19/12 2351  . heparin 1,050 Units/hr (11/19/12 0900)     Tucker Minter, DO  [facility] Pager [phone]  If 7PM-7AM, please contact night-coverage [email or web address] Password [facility] 11/20/2012, 9:33 AM   LOS: 3 days

## 2012-11-21 LAB — CBC
MCH: 31.4 pg (ref 26.0–34.0)
MCHC: 34.5 g/dL (ref 30.0–36.0)
MCV: 91.1 fL (ref 78.0–100.0)
Platelets: 123 10*3/uL — ABNORMAL LOW (ref 150–400)
RDW: 14 % (ref 11.5–15.5)

## 2012-11-21 LAB — BASIC METABOLIC PANEL
BUN: 11 mg/dL (ref 6–23)
Calcium: 8.1 mg/dL — ABNORMAL LOW (ref 8.4–10.5)
GFR calc non Af Amer: 21 mL/min — ABNORMAL LOW (ref >90)
Glucose, Bld: 90 mg/dL (ref 70–99)

## 2012-11-21 MED ORDER — WARFARIN SODIUM 5 MG PO TABS
5.0000 mg | ORAL_TABLET | Freq: Once | ORAL | Status: AC
  Administered 2012-11-21: 5 mg via ORAL
  Filled 2012-11-21: qty 1

## 2012-11-21 NOTE — Progress Notes (Signed)
Physical Therapy Treatment Patient Details Name: JOSUEL KOEPPEN MRN: [id number] DOB: 01/01/1946 Today's Date: 11/21/2012 Time: 1191-4782 PT Time Calculation (min): 27 min  PT Assessment / Plan / Recommendation Comments on Treatment Session  Pt impulsive and very shaky today during gait.  I did just wake him up from a nap.  He also c/o of his feet feeling "numb". Pt required increased assist with safety and proper use RW.    Follow Up Recommendations   (will discuss with evaluating LPT as she did not indicate f/u recommendations)  SNF/24/7 assist with [facility]   Does the patient have the potential to tolerate intense rehabilitation     Barriers to Discharge        Equipment Recommendations  Rolling walker with 5" wheels;Other (comment)   Recommendations for Other Services    Frequency Min 3X/week   Plan      Precautions / Restrictions Precautions Precautions: Fall Restrictions Weight Bearing Restrictions: No   Pertinent Vitals/Pain C/o "feet feel numb"    Mobility  Bed Mobility Bed Mobility: Supine to Sit Supine to Sit: 6: Modified independent (Device/Increase time);HOB elevated Details for Bed Mobility Assistance: increased time Transfers Transfers: Stand to Sit;Sit to Stand Sit to Stand: 4: Min assist;From bed Stand to Sit: 4: Min assist;To chair/3-in-1 Details for Transfer Assistance: noted increased tremors throughout and unsteady.  50% VC's on hand placement for increased safety. Ambulation/Gait Ambulation/Gait Assistance: 4: Min assist Ambulation Distance (Feet): 285 Feet Assistive device: Rolling walker Ambulation/Gait Assistance Details: Very shaky unsteady gait tending to push walker out too far to the front and required assistance with directing RW around obsticles and thru doorways as pt was "plowing thru".  Pt stated his feet feel numb.  Redirected pt to decrease gait speed and maintain appropriate distance from self to RW.  Pt demon 2/4 DOE with HR 108 and RA  sats 96%. Gait Pattern: Step-through pattern;Trunk flexed    PT Goals                                                            progressing    Visit Information  Last PT Received On: 11/21/12 Assistance Needed: +1    Subjective Data      Cognition       Balance   poor  End of Session PT - End of Session Equipment Utilized During Treatment: Gait belt Activity Tolerance: Patient limited by fatigue Patient left: in chair;with call bell/phone within reach;with chair alarm set   Felecia Shelling  PTA [facility]  Acute  Rehab Pager      [phone]

## 2012-11-21 NOTE — Progress Notes (Signed)
[facility] PROGRESS NOTE  Devin Williams:[id number] DOB: [birth year] DOA: 11/17/2012 PCP: No primary provider on file.  Assessment/Plan: Shortness of breath with bilateral DVTs  Patient admitted to step down with concern for PE given tachycardia- and shortness of breath.  -Oxygen saturation 95-100% on room air  -VQ scan low probability PE  -Continue heparin drip  -Continue warfarin -Patient took only 2 months of warfarin from his prior episodes of DVT  -Coumadin per pharmacy consult  -Suspect a component of deconditioning  -PT evaluation-->SNF, but patient refuses  Acute on chronic renal failure  -Abdominal ultrasound-reveals fatty liver without any hydronephrosis, normal spleen  -Random urine sodium and urine creatinine--FeNa = 0.24%  -Likely multifactorial including use of ibuprofen and lisinopril  -Informs taking about 2 tablets of Motrin daily for past one week for his leg pain.  -Continue IV fluids-->renal function improving -Hyperkalemia  -Improved with Kayexalate and IVF  -Continue telemetry UA unremarkable.  Elevated proBNP  -Clinically euvolemic  -Give fluid challenge--> continue fluids  -Echocardiogram EF 45-50%, grade 1 diastolic dysfunction, moderately dilated RV  -Likely due to chronic kidney disease  Elevated lactate  -Recheck lactic acid--> 1.5  Repeat lactate level still 3.7 Although improved likely from admission.Patient does not have any GI symptoms.  -Suspect volume depletion induced  Alcohol use/abuse  Informs drinking up to 1 pint 3 times a day. No signs of withdrawal. Continue CIWA  -Continue thiamine  -Check urine drug screen  CAD status post CABG  Asymptomatic. Serial cardiac enzymes negative. No EKG changes  Hypertension  Holding lisinopril given AKI  -Continue metoprolol tartrate  Thrombocytopenia  -Likely due to a combination of DVT as well as suspected underlying alcoholic cirrhosis  -Continue to monitor  Hypomagnesemia  -replete   Disposition Plan: Home when medically stable           Procedures/Studies: Dg Chest 2 View  11/17/2012  *RADIOLOGY REPORT*  Clinical Data: Cough  CHEST - 2 VIEW  Comparison: 07/03/2009  Findings: Postop CABG.  Negative for heart failure.  Lungs are clear without infiltrate or effusion.  Multiple healed chronic rib fractures.  IMPRESSION: No acute cardiopulmonary abnormality.   Original Report Authenticated By: [name], [name]    US Abdomen Complete  11/19/2012  *RADIOLOGY REPORT*  Clinical Data:  Acute renal failure.  All views.  COMPLETE ABDOMINAL ULTRASOUND  Comparison:  Renal ultrasound dated 11/14/2004  Findings:  Gallbladder:  Multiple gallstones.  No gallbladder wall thickening. Negative sonographic Murphy's sign.  Common bile duct:  Limited visualization of the common bile duct. The visualized segment is 6.1 mm in diameter.  Liver:  The liver is diffusely echogenic.  The left lobe is not well seen.  No mass lesions.  IVC:  Not visualized.  Pancreas:  Not visualized.  Spleen:  Normal.  6.5 cm.  Right Kidney:  Normal.  11.2 cm in length.  Left Kidney:  Normal.  10.8 cm in length.  Abdominal aorta:  A small segment was visualized in the mid abdomen and measures 2.2 cm in diameter.  IMPRESSION: Cholelithiasis.  Hepatic steatosis.  IVC and pancreas are not visible.  The kidneys appear normal and unchanged since the prior exam.   Original Report Authenticated By: [name], [name]    Nm Pulmonary Perf And Vent  11/18/2012  *RADIOLOGY REPORT*  Clinical Data: Shortness of breath  NM PULMONARY VENTILATION AND PERFUSION SCAN  Views:  Anterior, posterior, right lateral, left lateral, RAO, LAO, RPO, LPO - ventilation and perfusion  Radiopharmaceutical:  Technetium 24m DTPA - ventilation; technetium 77m macroaggregated albumin - perfusion  Dose:  41.0 mCi - ventilation; 5.5 mCi - perfusion  Route of administration:  Inhalation - ventilation; intravenous - perfusion  Comparison: Chest radiograph  November 17, 2012  Findings:  The ventilation study shows homogeneous and symmetric uptake of radiotracer bilaterally.  On the perfusion study, there are no segmental perfusion defects. There is photopenia in the major fissures bilaterally.  This is not a finding that is typical for pulmonary embolus, and is more suggestive of fluid in these areas.  IMPRESSION:  Question fluid of both major fissures, a finding not seen on recent chest radiograph.  There are no appreciable segmental ventilation / perfusion mismatches on this study.  This study is felt to constitute an overall low probability of pulmonary embolus.   Original Report Authenticated By: Bretta Bang, [name]          Subjective: Patient is feeling better. He denies any fevers, chills, chest pain, shortness breath, nausea, vomiting, diarrhea, abdominal pain, dysuria, hematuria.  Objective: Filed Vitals:   11/20/12 1506 11/20/12 2148 11/21/12 0554 11/21/12 1540  BP: 115/85 115/76 129/77 132/78  Pulse: 85 101 95 96  Temp: 97.9 F (36.6 C) 98.3 F (36.8 C) 98.1 F (36.7 C) 97.9 F (36.6 C)  TempSrc: Oral Oral Oral Axillary  Resp:  20 20 21   Height:      Weight:   94.9 kg (209 lb 3.5 oz)   SpO2: 95% 99% 98% 96%    Intake/Output Summary (Last 24 hours) at 11/21/12 1750 Last data filed at 11/21/12 1500  Gross per 24 hour  Intake 2557.65 ml  Output    250 ml  Net 2307.65 ml   Weight change: -1.1 kg (-2 lb 6.8 oz) Exam:   General:  Pt is alert, follows commands appropriately, not in acute distress  HEENT: No icterus, No thrush, [place]/AT  Cardiovascular: RRR, S1/S2, no rubs, no gallops  Respiratory: CTA bilaterally, no wheezing, no crackles, no rhonchi  Abdomen: Soft/+BS, non tender, non distended, no guarding  Extremities: trace edema, No lymphangitis, No petechiae, No rashes, no synovitis  Data Reviewed: Basic Metabolic Panel:  Recent Labs Lab 11/18/12 0525 11/18/12 1644 11/19/12 0340 11/20/12 0507  11/21/12 0440  NA 134* 134* 132* 137 140  K 5.7* 4.5 4.4 4.2 4.2  CL 101 99 98 104 109  CO2 22 27 26 22 23   GLUCOSE 94 98 77 76 90  BUN 13 12 12 12 11   CREATININE 2.40* 2.57* 2.82* 3.11* 2.97*  CALCIUM 8.2* 7.9* 8.1* 7.8* 8.1*  MG  --   --   --  1.3* 1.5  PHOS  --   --   --  3.0  --    Liver Function Tests:  Recent Labs Lab 11/17/12 1545 11/17/12 1805  AST 149* 111*  ALT 62* 50  ALKPHOS 184* 163*  BILITOT 0.6 0.6  PROT 8.0 6.7  ALBUMIN 3.1* 2.7*   No results found for this basename: LIPASE, AMYLASE,  in the last 168 hours No results found for this basename: AMMONIA,  in the last 168 hours CBC:  Recent Labs Lab 11/17/12 1539 11/18/12 0525 11/19/12 0340 11/20/12 0507 11/21/12 0440  WBC 5.2 4.6 4.4 3.9* 4.3  NEUTROABS 3.6  --   --   --   --   HGB 14.5 12.2* 11.0* 9.8* 9.9*  HCT 42.4 35.6* 32.1* 29.0* 28.7*  MCV 91.6 90.1 89.7 90.3 91.1  PLT 184 118* 119* 116*  123*   Cardiac Enzymes:  Recent Labs Lab 11/17/12 1545 11/17/12 2344 11/18/12 0525 11/18/12 1150 11/18/12 1644 11/19/12 1018  CKTOTAL  --   --   --   --  75 69  TROPONINI <0.30 <0.30 <0.30 <0.30  --   --    BNP: No components found with this basename: POCBNP,  CBG: No results found for this basename: GLUCAP,  in the last 168 hours  Recent Results (from the past 240 hour(s))  URINE CULTURE     Status: None   Collection Time    11/17/12  5:42 PM      Result Value Range Status   Specimen Description URINE, RANDOM   Final   Special Requests NONE   Final   Culture  Setup Time 11/18/2012 02:11   Final   Colony Count 3,000 COLONIES/ML   Final   Culture INSIGNIFICANT GROWTH   Final   Report Status 11/18/2012 FINAL   Final  MRSA PCR SCREENING     Status: None   Collection Time    11/18/12  1:06 AM      Result Value Range Status   MRSA by PCR NEGATIVE  NEGATIVE Final   Comment:            The GeneXpert MRSA Assay (FDA     approved for NASAL specimens     only), is one component of a      comprehensive MRSA colonization     surveillance program. It is not     intended to diagnose MRSA     infection nor to guide or     monitor treatment for     MRSA infections.     Scheduled Meds: . acitretin  25 mg Oral QAC breakfast  . aspirin EC  81 mg Oral Daily  . atorvastatin  20 mg Oral QHS  . feeding supplement  237 mL Oral BID BM  . fenofibrate  160 mg Oral Daily  . folic acid  1 mg Oral Daily  . hydrocortisone cream  1 application Topical BID  . LORazepam  0-4 mg Oral Q12H  . metoprolol  25 mg Oral BID  . multivitamin with minerals  1 tablet Oral Daily  . pantoprazole  40 mg Oral Daily  . sodium chloride  3 mL Intravenous Q12H  . sodium polystyrene  30 g Oral Once  . thiamine  100 mg Oral Daily   Or  . thiamine  100 mg Intravenous Daily  . warfarin  5 mg Oral ONCE-1800  . warfarin   Does not apply Once  . Warfarin - Pharmacist Dosing Inpatient   Does not apply q1800   Continuous Infusions: . sodium chloride 75 mL/hr at 11/21/12 1119  . heparin 1,050 Units/hr (11/21/12 1117)     Isabellamarie Randa, DO  [facility] Pager [phone]  If 7PM-7AM, please contact night-coverage [email or web address] Password [email or web address] 11/21/2012, 5:50 PM   LOS: 4 days

## 2012-11-21 NOTE — Progress Notes (Addendum)
ANTICOAGULATION CONSULT NOTE - Follow Up Consult  Pharmacy Consult for Heparin/Warfarin Indication: pulmonary embolus  No Known Allergies  Patient Measurements: Height: 5\' 9"  (175.3 cm) Weight: 209 lb 3.5 oz (94.9 kg) IBW/kg (Calculated) : 70.7   Vital Signs: Temp: 98.1 F (36.7 C) (03/21 0554) Temp src: Oral (03/21 0554) BP: 129/77 mmHg (03/21 0554) Pulse Rate: 95 (03/21 0554)  Labs:  Recent Labs  11/18/12 1150  11/18/12 1644  11/19/12 0340 11/19/12 1018 11/19/12 1604 11/20/12 0507 11/21/12 0440  HGB  --   --   --   < > 11.0*  --   --  9.8* 9.9*  HCT  --   --   --   --  32.1*  --   --  29.0* 28.7*  PLT  --   --   --   --  119*  --   --  116* 123*  LABPROT  --   --   --   --  15.9*  --   --  17.7* 19.6*  INR  --   --   --   --  1.30  --   --  1.50* 1.72*  HEPARINUNFRC 0.80*  --   --   < > 0.63  --  0.45 0.45 0.42  CREATININE  --   < > 2.57*  --  2.82*  --   --  3.11* 2.97*  CKTOTAL  --   --  75  --   --  69  --   --   --   TROPONINI <0.30  --   --   --   --   --   --   --   --   < > = values in this interval not displayed.  Estimated Creatinine Clearance: 28.2 ml/min (by C-G formula based on Cr of 2.97).   Medications:  Scheduled:  . acitretin  25 mg Oral QAC breakfast  . aspirin EC  81 mg Oral Daily  . atorvastatin  20 mg Oral QHS  . feeding supplement  237 mL Oral BID BM  . fenofibrate  160 mg Oral Daily  . folic acid  1 mg Oral Daily  . hydrocortisone cream  1 application Topical BID  . LORazepam  0-4 mg Oral Q12H  . [COMPLETED] magnesium sulfate 1 - 4 g bolus IVPB  1 g Intravenous Once  . metoprolol  25 mg Oral BID  . multivitamin with minerals  1 tablet Oral Daily  . pantoprazole  40 mg Oral Daily  . sodium chloride  3 mL Intravenous Q12H  . sodium polystyrene  30 g Oral Once  . thiamine  100 mg Oral Daily   Or  . thiamine  100 mg Intravenous Daily  . [COMPLETED] vitamin A & D      . warfarin   Does not apply Once  . Warfarin - Pharmacist Dosing  Inpatient   Does not apply q1800  . [DISCONTINUED] metoprolol  50 mg Oral BID   Infusions:  . sodium chloride 75 mL/hr at 11/20/12 2204  . heparin 1,050 Units/hr (11/20/12 1137)   PRN: acetaminophen, acetaminophen, alum & mag hydroxide-simeth, calcium carbonate, diphenhydrAMINE, [EXPIRED] LORazepam, [EXPIRED] LORazepam, ondansetron (ZOFRAN) IV, ondansetron  Assessment:  66 yom admit 3/17 with bilateral lower extremity DVT, with mobile, occlusive DVT in left. VQ scan shows low probability of PE.  Patient initated on IV heparin 3/17 and warfarin started 3/19, Today is D#3 warfarin/Heparin overlap.   Hgb low/stable at 9.9,  Plt low/improved 123.  No bleeding reported.  Suspect patient may have underlying alcoholic cirrhosis   Heparin level (0.42) this AM is therapeutic. Heparin infusing at 1050 Units/hour  Warfarin not ordered on 3/20 and overlooked - INR continues to trending up despite dose not given.    Goal of Therapy:  INR 2-3 Heparin level 0.3-0.7 units/ml Monitor platelets by anticoagulation protocol: Yes   Plan:   Warfarin 5 mg po x 1 at 1800   Continue Heparin IV at 1050 Units/hour - Recommend at least 5 days overlap with warfarin/heparin and until INR is > 2 for 24 hours.   When okay with MD could change to Lovenox 100 mg Q24h (based on 1 mg/kg Q24h dosing - adjusted for CrCl < 30 ml/min)  Daily Heparin level and CBC, Monitor CBC daily   Zaelyn Barbary, Loma Messing PharmD Pager #: [phone] 8:25 AM 11/21/2012

## 2012-11-22 DIAGNOSIS — I2510 Atherosclerotic heart disease of native coronary artery without angina pectoris: Secondary | ICD-10-CM

## 2012-11-22 DIAGNOSIS — F1010 Alcohol abuse, uncomplicated: Secondary | ICD-10-CM

## 2012-11-22 LAB — CBC
HCT: 25.5 % — ABNORMAL LOW (ref 39.0–52.0)
MCHC: 34.9 g/dL (ref 30.0–36.0)
MCV: 90.4 fL (ref 78.0–100.0)
RDW: 14.2 % (ref 11.5–15.5)

## 2012-11-22 LAB — FERRITIN: Ferritin: 974 ng/mL — ABNORMAL HIGH (ref 22–322)

## 2012-11-22 LAB — BASIC METABOLIC PANEL
CO2: 21 mEq/L (ref 19–32)
Calcium: 7.9 mg/dL — ABNORMAL LOW (ref 8.4–10.5)
Chloride: 111 mEq/L (ref 96–112)
Creatinine, Ser: 2.54 mg/dL — ABNORMAL HIGH (ref 0.50–1.35)
Glucose, Bld: 85 mg/dL (ref 70–99)
Sodium: 140 mEq/L (ref 135–145)

## 2012-11-22 LAB — HEPARIN LEVEL (UNFRACTIONATED): Heparin Unfractionated: 0.43 IU/mL (ref 0.30–0.70)

## 2012-11-22 LAB — IRON AND TIBC
Saturation Ratios: 42 % (ref 20–55)
TIBC: 151 ug/dL — ABNORMAL LOW (ref 215–435)
UIBC: 87 ug/dL — ABNORMAL LOW (ref 125–400)

## 2012-11-22 MED ORDER — WARFARIN SODIUM 5 MG PO TABS
5.0000 mg | ORAL_TABLET | Freq: Once | ORAL | Status: AC
  Administered 2012-11-22: 5 mg via ORAL
  Filled 2012-11-22: qty 1

## 2012-11-22 MED ORDER — HEPARIN (PORCINE) IN NACL 100-0.45 UNIT/ML-% IJ SOLN
1150.0000 [IU]/h | INTRAMUSCULAR | Status: DC
  Administered 2012-11-22 – 2012-11-23 (×2): 1150 [IU]/h via INTRAVENOUS
  Filled 2012-11-22 (×3): qty 250

## 2012-11-22 MED ORDER — MAGNESIUM SULFATE 40 MG/ML IJ SOLN
2.0000 g | Freq: Once | INTRAMUSCULAR | Status: AC
  Administered 2012-11-22: 2 g via INTRAVENOUS
  Filled 2012-11-22: qty 50

## 2012-11-22 NOTE — Progress Notes (Signed)
Brief Pharmacy Update:  Please see consult note written earlier for full details.   66 yoM on IV heparin/Coumadin bridge for DVTs.  Heparin level therapeutic this afternoon with heparin infusion at 1150 units/hr.  Repeat heparin level drawn this evening remained therapeutic, confirming current rate.  F/u daily heparin level in AM.  Clance Boll, PharmD, BCPS Pager: [phone] 11/22/2012 8:39 PM

## 2012-11-22 NOTE — Consult Note (Signed)
Reason for Consult: Alcohol abuse vs dependence Referring Physician: Dr. Marylene Land is an 67 y.o. male.  HPI: Patient was seen and chart reviewed. Patient was admitted to [facility] long medical floor with the deep venous thrombosis and has a history of hypertension and cyanosis. Kidney disease, coronary artery disease, and hyperlipidemia as. Patient also report history of for drinking alcohol throughout his life. Patient reported he has a DWI in 1998. Patient drinks water, half pint a day, but denies problems with the family, law and health at this time. Patient denies, alcohol, withdrawal symptoms, blackouts, negative behaviors and delirium tremens. Reportedly, he was working as a Network engineer and able to work on good 3 weeks ago. He lives with his wife and has a grown up children  lives in [place] and [place] area. His wife is supportive to him. Patient has been dressing himself to walk back frustrated about staff. Is not letting him to be alone on his ADLs.  MSE: He was awake, alert, oriented to time, place, person and situation. Patient has denied symptoms of depression, anxiety, and psychosis. Patient has normal speech and thought process. He has no suicidal onset ideation or evidence of psychotic symptoms.  Past Medical History  Diagnosis Date  . Hypertension   . DVT (deep venous thrombosis)     right leg  . Psoriasis   . Chronic kidney disease   . Coronary artery disease   . Hyperlipidemia     Past Surgical History  Procedure Laterality Date  . Cardiac surgery    . Knee surgery    . Appendectomy    . Coronary artery bypass graft      Family History  Problem Relation Age of Onset  . Diabetes Mellitus II Father     Social History:  reports that he quit smoking about 12 years ago. He has never used smokeless tobacco. He reports that  drinks alcohol. He reports that he does not use illicit drugs.  Allergies: No Known  Allergies  Medications: I have reviewed the patient's current medications.  Results for orders placed during the hospital encounter of 11/17/12 (from the past 48 hour(s))  HEPARIN LEVEL (UNFRACTIONATED)     Status: None   Collection Time    11/21/12  4:40 AM      Result Value Range   Heparin Unfractionated 0.42  0.30 - 0.70 IU/mL   Comment:            IF HEPARIN RESULTS ARE BELOW     EXPECTED VALUES, AND PATIENT     DOSAGE HAS BEEN CONFIRMED,     SUGGEST FOLLOW UP TESTING     OF ANTITHROMBIN III LEVELS.  CBC     Status: Abnormal   Collection Time    11/21/12  4:40 AM      Result Value Range   WBC 4.3  4.0 - 10.5 K/uL   RBC 3.15 (*) 4.22 - 5.81 MIL/uL   Hemoglobin 9.9 (*) 13.0 - 17.0 g/dL   HCT 16.1 (*) 09.6 - 04.5 %   MCV 91.1  78.0 - 100.0 fL   MCH 31.4  26.0 - 34.0 pg   MCHC 34.5  30.0 - 36.0 g/dL   RDW 40.9  81.1 - 91.4 %   Platelets 123 (*) 150 - 400 K/uL  PROTIME-INR     Status: Abnormal   Collection Time    11/21/12  4:40 AM      Result Value Range  Prothrombin Time 19.6 (*) 11.6 - 15.2 seconds   INR 1.72 (*) 0.00 - 1.49  BASIC METABOLIC PANEL     Status: Abnormal   Collection Time    11/21/12  4:40 AM      Result Value Range   Sodium 140  135 - 145 mEq/L   Potassium 4.2  3.5 - 5.1 mEq/L   Chloride 109  96 - 112 mEq/L   CO2 23  19 - 32 mEq/L   Glucose, Bld 90  70 - 99 mg/dL   BUN 11  6 - 23 mg/dL   Creatinine, Ser 4.09 (*) 0.50 - 1.35 mg/dL   Calcium 8.1 (*) 8.4 - 10.5 mg/dL   GFR calc non Af Amer 21 (*) >90 mL/min   GFR calc Af Amer 24 (*) >90 mL/min   Comment:            The eGFR has been calculated     using the CKD EPI equation.     This calculation has not been     validated in all clinical     situations.     eGFR's persistently     <90 mL/min signify     possible Chronic Kidney Disease.  MAGNESIUM     Status: None   Collection Time    11/21/12  4:40 AM      Result Value Range   Magnesium 1.5  1.5 - 2.5 mg/dL  HEPARIN LEVEL (UNFRACTIONATED)      Status: Abnormal   Collection Time    11/22/12  5:32 AM      Result Value Range   Heparin Unfractionated 0.24 (*) 0.30 - 0.70 IU/mL   Comment:            IF HEPARIN RESULTS ARE BELOW     EXPECTED VALUES, AND PATIENT     DOSAGE HAS BEEN CONFIRMED,     SUGGEST FOLLOW UP TESTING     OF ANTITHROMBIN III LEVELS.  CBC     Status: Abnormal   Collection Time    11/22/12  5:32 AM      Result Value Range   WBC 4.6  4.0 - 10.5 K/uL   RBC 2.82 (*) 4.22 - 5.81 MIL/uL   Hemoglobin 8.9 (*) 13.0 - 17.0 g/dL   HCT 81.1 (*) 91.4 - 78.2 %   MCV 90.4  78.0 - 100.0 fL   MCH 31.6  26.0 - 34.0 pg   MCHC 34.9  30.0 - 36.0 g/dL   RDW 95.6  21.3 - 08.6 %   Platelets 114 (*) 150 - 400 K/uL   Comment: CONSISTENT WITH PREVIOUS RESULT  PROTIME-INR     Status: Abnormal   Collection Time    11/22/12  5:32 AM      Result Value Range   Prothrombin Time 20.9 (*) 11.6 - 15.2 seconds   INR 1.88 (*) 0.00 - 1.49  BASIC METABOLIC PANEL     Status: Abnormal   Collection Time    11/22/12  5:32 AM      Result Value Range   Sodium 140  135 - 145 mEq/L   Potassium 4.2  3.5 - 5.1 mEq/L   Chloride 111  96 - 112 mEq/L   CO2 21  19 - 32 mEq/L   Glucose, Bld 85  70 - 99 mg/dL   BUN 8  6 - 23 mg/dL   Creatinine, Ser 5.78 (*) 0.50 - 1.35 mg/dL   Calcium 7.9 (*) 8.4 - 10.5  mg/dL   GFR calc non Af Amer 25 (*) >90 mL/min   GFR calc Af Amer 29 (*) >90 mL/min   Comment:            The eGFR has been calculated     using the CKD EPI equation.     This calculation has not been     validated in all clinical     situations.     eGFR's persistently     <90 mL/min signify     possible Chronic Kidney Disease.  MAGNESIUM     Status: Abnormal   Collection Time    11/22/12  5:32 AM      Result Value Range   Magnesium 1.4 (*) 1.5 - 2.5 mg/dL    No results found.  Positive for depression and excessive alcohol consumption Blood pressure 108/62, pulse 88, temperature 98.7 F (37.1 C), temperature source Oral, resp. rate  18, height 5\' 9"  (1.753 m), weight 214 lb 11.7 oz (97.4 kg), SpO2 100.00%.   Assessment/Plan: Alcohol abuse vs dependece  Recommendation: Patient does not required acute psychiatric hospitalization for detox treatment. Patient may benefit from substance abuse rehabilitation services when medically cleared,.able to function with it ADLs. Recommended to contact social service for possible inpatient facilities in local area for substance abuse rehabilitation services. Recommended. No medication management at this time  Meridith Romick,JANARDHAHA R. 11/22/2012, 1:33 PM

## 2012-11-22 NOTE — Progress Notes (Signed)
Called for difficult start for routine RS.  2 IVT RN's assessed but no site appreciated.  Pt also c/o severe pain with alcohol wipes on skin.  Pt requested not to be stuck.  Current PIV infusing without difficulty or pt c/o tenderness or burning.  Will notify RN.

## 2012-11-22 NOTE — Progress Notes (Signed)
[facility] PROGRESS NOTE  Devin Williams HYQ:[id number] DOB: 1946/08/14 DOA: 11/17/2012 PCP: No primary provider on file.  Assessment/Plan: Shortness of breath with bilateral DVTs  Patient admitted to step down with concern for PE given tachycardia- and shortness of breath.  -Due to the patient's history of noncompliance with medical therapy and followup, plan to keep the patient in the hospital until INR is therapeutic -Oxygen saturation 95-100% on room air  -VQ scan low probability PE  -Continue heparin drip--today is D#5 for overlap with warfarin -Continue warfarin  -Patient took only 2 months of warfarin from his prior episodes of DVT  -Coumadin per pharmacy consult  -Suspect a component of deconditioning for SOB -PT evaluation-->SNF, but patient refuses  Acute on chronic renal failure  -Abdominal ultrasound-reveals fatty liver without any hydronephrosis, normal spleen  -Random urine sodium and urine creatinine--FeNa = 0.24%  -Likely multifactorial including use of ibuprofen and lisinopril  -Informs taking about 2 tablets of Motrin daily for past one week for his leg pain.  -Continue IV fluids-->renal function improving  -Hyperkalemia  -Improved with Kayexalate and IVF  -Continue telemetry UA unremarkable.  Elevated proBNP  -Clinically euvolemic  -Give fluid challenge--> continue fluids--> no signs of fluid overload -Echocardiogram EF 45-50%, grade 1 diastolic dysfunction, moderately dilated RV  -Likely due to chronic kidney disease  Elevated lactate  -Recheck lactic acid--> 1.5  Repeat lactate level still 3.7 Although improved likely from admission.Patient does not have any GI symptoms.  -Suspect volume depletion induced  Alcohol use/abuse  -Informs drinking up to 1 pint 3 times a day. No signs of withdrawal. Continue CIWA  -Continue thiamine  -No signs of withdrawal since hospitalization -Has little desire to quit CAD status post CABG  Asymptomatic. Serial  cardiac enzymes negative. No EKG changes  Hypertension  Holding lisinopril given AKI  -Continue metoprolol tartrate  Thrombocytopenia  -Likely due to a combination of DVT as well as suspected underlying alcoholic cirrhosis  -Continue to monitor  Hypomagnesemia  -replete  Deconditioning -Continue physical therapy -Patient refuses SNF placement Disposition Plan: Home when medically stable         Procedures/Studies: Dg Chest 2 View  11/17/2012  *RADIOLOGY REPORT*  Clinical Data: Cough  CHEST - 2 VIEW  Comparison: 07/03/2009  Findings: Postop CABG.  Negative for heart failure.  Lungs are clear without infiltrate or effusion.  Multiple healed chronic rib fractures.  IMPRESSION: No acute cardiopulmonary abnormality.   Original Report Authenticated By: [name], [name]    US Abdomen Complete  11/19/2012  *RADIOLOGY REPORT*  Clinical Data:  Acute renal failure.  All views.  COMPLETE ABDOMINAL ULTRASOUND  Comparison:  Renal ultrasound dated 11/14/2004  Findings:  Gallbladder:  Multiple gallstones.  No gallbladder wall thickening. Negative sonographic Murphy's sign.  Common bile duct:  Limited visualization of the common bile duct. The visualized segment is 6.1 mm in diameter.  Liver:  The liver is diffusely echogenic.  The left lobe is not well seen.  No mass lesions.  IVC:  Not visualized.  Pancreas:  Not visualized.  Spleen:  Normal.  6.5 cm.  Right Kidney:  Normal.  11.2 cm in length.  Left Kidney:  Normal.  10.8 cm in length.  Abdominal aorta:  A small segment was visualized in the mid abdomen and measures 2.2 cm in diameter.  IMPRESSION: Cholelithiasis.  Hepatic steatosis.  IVC and pancreas are not visible.  The kidneys appear normal and unchanged since the prior exam.   Original Report Authenticated By:  [name], [name]    Nm Pulmonary Perf And Vent  11/18/2012  *RADIOLOGY REPORT*  Clinical Data: Shortness of breath  NM PULMONARY VENTILATION AND PERFUSION SCAN  Views:  Anterior,  posterior, right lateral, left lateral, RAO, LAO, RPO, LPO - ventilation and perfusion  Radiopharmaceutical: Technetium 34m DTPA - ventilation; technetium 67m macroaggregated albumin - perfusion  Dose:  41.0 mCi - ventilation; 5.5 mCi - perfusion  Route of administration:  Inhalation - ventilation; intravenous - perfusion  Comparison: Chest radiograph November 17, 2012  Findings:  The ventilation study shows homogeneous and symmetric uptake of radiotracer bilaterally.  On the perfusion study, there are no segmental perfusion defects. There is photopenia in the major fissures bilaterally.  This is not a finding that is typical for pulmonary embolus, and is more suggestive of fluid in these areas.  IMPRESSION:  Question fluid of both major fissures, a finding not seen on recent chest radiograph.  There are no appreciable segmental ventilation / perfusion mismatches on this study.  This study is felt to constitute an overall low probability of pulmonary embolus.   Original Report Authenticated By: Bretta Bang, [name]          Subjective: Patient is feeling well. He denies any fevers, chills, chest pain, shortness breath, nausea, vomiting, diarrhea, abdominal pain, dysuria, hematuria, dizziness.  Objective: Filed Vitals:   11/21/12 0554 11/21/12 1540 11/21/12 2302 11/22/12 0600  BP: 129/77 132/78 111/65 108/62  Pulse: 95 96 98 88  Temp: 98.1 F (36.7 C) 97.9 F (36.6 C) 98.6 F (37 C) 98.7 F (37.1 C)  TempSrc: Oral Axillary Oral Oral  Resp: 20 21 20 18   Height:      Weight: 94.9 kg (209 lb 3.5 oz)   97.4 kg (214 lb 11.7 oz)  SpO2: 98% 96% 92% 100%    Intake/Output Summary (Last 24 hours) at 11/22/12 0820 Last data filed at 11/22/12 0743  Gross per 24 hour  Intake 2644.51 ml  Output    400 ml  Net 2244.51 ml   Weight change: 2.5 kg (5 lb 8.2 oz) Exam:   General:  Pt is alert, follows commands appropriately, not in acute distress  HEENT: No icterus, No  thrush,[place]/AT  Cardiovascular: RRR, S1/S2, no rubs, no gallops  Respiratory: CTA bilaterally, no wheezing, no crackles, no rhonchi  Abdomen: Soft/+BS, non tender, non distended, no guarding  Extremities: 1+ edema, No lymphangitis, No petechiae, No rashes, no synovitis  Data Reviewed: Basic Metabolic Panel:  Recent Labs Lab 11/18/12 1644 11/19/12 0340 11/20/12 0507 11/21/12 0440 11/22/12 0532  NA 134* 132* 137 140 140  K 4.5 4.4 4.2 4.2 4.2  CL 99 98 104 109 111  CO2 27 26 22 23 21   GLUCOSE 98 77 76 90 85  BUN 12 12 12 11 8   CREATININE 2.57* 2.82* 3.11* 2.97* 2.54*  CALCIUM 7.9* 8.1* 7.8* 8.1* 7.9*  MG  --   --  1.3* 1.5 1.4*  PHOS  --   --  3.0  --   --    Liver Function Tests:  Recent Labs Lab 11/17/12 1545 11/17/12 1805  AST 149* 111*  ALT 62* 50  ALKPHOS 184* 163*  BILITOT 0.6 0.6  PROT 8.0 6.7  ALBUMIN 3.1* 2.7*   No results found for this basename: LIPASE, AMYLASE,  in the last 168 hours No results found for this basename: AMMONIA,  in the last 168 hours CBC:  Recent Labs Lab 11/17/12 1539 11/18/12 0525 11/19/12 0340 11/20/12 0507  11/21/12 0440 11/22/12 0532  WBC 5.2 4.6 4.4 3.9* 4.3 4.6  NEUTROABS 3.6  --   --   --   --   --   HGB 14.5 12.2* 11.0* 9.8* 9.9* 8.9*  HCT 42.4 35.6* 32.1* 29.0* 28.7* 25.5*  MCV 91.6 90.1 89.7 90.3 91.1 90.4  PLT 184 118* 119* 116* 123* 114*   Cardiac Enzymes:  Recent Labs Lab 11/17/12 1545 11/17/12 2344 11/18/12 0525 11/18/12 1150 11/18/12 1644 11/19/12 1018  CKTOTAL  --   --   --   --  75 69  TROPONINI <0.30 <0.30 <0.30 <0.30  --   --    BNP: No components found with this basename: POCBNP,  CBG: No results found for this basename: GLUCAP,  in the last 168 hours  Recent Results (from the past 240 hour(s))  URINE CULTURE     Status: None   Collection Time    11/17/12  5:42 PM      Result Value Range Status   Specimen Description URINE, RANDOM   Final   Special Requests NONE   Final   Culture   Setup Time 11/18/2012 02:11   Final   Colony Count 3,000 COLONIES/ML   Final   Culture INSIGNIFICANT GROWTH   Final   Report Status 11/18/2012 FINAL   Final  MRSA PCR SCREENING     Status: None   Collection Time    11/18/12  1:06 AM      Result Value Range Status   MRSA by PCR NEGATIVE  NEGATIVE Final   Comment:            The GeneXpert MRSA Assay (FDA     approved for NASAL specimens     only), is one component of a     comprehensive MRSA colonization     surveillance program. It is not     intended to diagnose MRSA     infection nor to guide or     monitor treatment for     MRSA infections.     Scheduled Meds: . acitretin  25 mg Oral QAC breakfast  . aspirin EC  81 mg Oral Daily  . atorvastatin  20 mg Oral QHS  . feeding supplement  237 mL Oral BID BM  . fenofibrate  160 mg Oral Daily  . folic acid  1 mg Oral Daily  . hydrocortisone cream  1 application Topical BID  . metoprolol  25 mg Oral BID  . multivitamin with minerals  1 tablet Oral Daily  . pantoprazole  40 mg Oral Daily  . sodium chloride  3 mL Intravenous Q12H  . sodium polystyrene  30 g Oral Once  . thiamine  100 mg Oral Daily   Or  . thiamine  100 mg Intravenous Daily  . warfarin  5 mg Oral ONCE-1800  . warfarin   Does not apply Once  . Warfarin - Pharmacist Dosing Inpatient   Does not apply q1800   Continuous Infusions: . sodium chloride 75 mL/hr at 11/22/12 0052  . heparin 1,150 Units/hr (11/22/12 0807)     Wandalee Klang, DO  [facility] Pager [phone]  If 7PM-7AM, please contact night-coverage [email or web address] Password [email or web address] 11/22/2012, 8:20 AM   LOS: 5 days

## 2012-11-22 NOTE — Progress Notes (Signed)
ANTICOAGULATION CONSULT NOTE - Follow Up Consult  Pharmacy Consult for Heparin/Warfarin Indication: pulmonary embolus  No Known Allergies  Patient Measurements: Height: 5\' 9"  (175.3 cm) Weight: 214 lb 11.7 oz (97.4 kg) IBW/kg (Calculated) : 70.7 Heparin dosing weight = 91.1kg  Vital Signs: Temp: 98.7 F (37.1 C) (03/22 0600) Temp src: Oral (03/22 0600) BP: 108/62 mmHg (03/22 0600) Pulse Rate: 88 (03/22 0600)  Labs:  Recent Labs  11/19/12 1018  11/20/12 0507 11/21/12 0440 11/22/12 0532  HGB  --   < > 9.8* 9.9* 8.9*  HCT  --   --  29.0* 28.7* 25.5*  PLT  --   --  116* 123* 114*  LABPROT  --   --  17.7* 19.6* 20.9*  INR  --   --  1.50* 1.72* 1.88*  HEPARINUNFRC  --   < > 0.45 0.42 0.24*  CREATININE  --   --  3.11* 2.97* 2.54*  CKTOTAL 69  --   --   --   --   < > = values in this interval not displayed.  Estimated Creatinine Clearance: 33.4 ml/min (by C-G formula based on Cr of 2.54).   Medications:  Scheduled:  . acitretin  25 mg Oral QAC breakfast  . aspirin EC  81 mg Oral Daily  . atorvastatin  20 mg Oral QHS  . feeding supplement  237 mL Oral BID BM  . fenofibrate  160 mg Oral Daily  . folic acid  1 mg Oral Daily  . hydrocortisone cream  1 application Topical BID  . [EXPIRED] LORazepam  0-4 mg Oral Q12H  . metoprolol  25 mg Oral BID  . multivitamin with minerals  1 tablet Oral Daily  . pantoprazole  40 mg Oral Daily  . sodium chloride  3 mL Intravenous Q12H  . sodium polystyrene  30 g Oral Once  . thiamine  100 mg Oral Daily   Or  . thiamine  100 mg Intravenous Daily  . [COMPLETED] warfarin  5 mg Oral ONCE-1800  . warfarin   Does not apply Once  . Warfarin - Pharmacist Dosing Inpatient   Does not apply q1800   Infusions:  . sodium chloride 75 mL/hr at 11/22/12 0052  . heparin 1,050 Units/hr (11/22/12 0200)   PRN: acetaminophen, acetaminophen, alum & mag hydroxide-simeth, calcium carbonate, diphenhydrAMINE, ondansetron (ZOFRAN) IV,  ondansetron  Assessment: 66 yom on D#4 Coumadin/IV Heparin bridge for bilateral lower extremity DVT, with mobile, occlusive DVT in left. VQ scan shows low probability of PE.     Hgb trending down, now 8.9, plts low but stable.  No bleeding reported per RN. Suspect patient may have underlying alcoholic cirrhosis   Heparin level today has decreased, now subtherapeutic @ 0.24. INR is rising, now @ 1.88, still subtherapeutic.    Renal:  SCr improving, CrCl now ~33 ml/min.     Goal of Therapy:  INR 2-3 Heparin level 0.3-0.7 units/ml Monitor platelets by anticoagulation protocol: Yes Recommend at least 5 days overlap with warfarin/heparin and until INR is > 2 for 24 hours.     Plan:  1.  Repeat Warfarin 5 mg po x 1 at 1800  2.  Increase Heparin to 1150 Units/hour 3.  Confirmation heparin level in 6 hours 4.  F/u daily PT/INR, heparin level, CBC, and renal function   Haynes Hoehn, PharmD 11/22/2012 7:41 AM  Pager: [phone]

## 2012-11-22 NOTE — Progress Notes (Signed)
Brief Pharmacy Update:  Please see consult note written earlier for full details.   66 yoM on IV heparin/Coumadin bridge for DVTs.  Heparin infusion rate increased this morning due to decrease in heparin level < 0.3.  Heparin level now at 1400 is therapeutic, no issues noted.  Will obtain confirmation heparin level tonight at 2000 and continue heparin level at current infusion rate.    Haynes Hoehn, PharmD 11/22/2012 2:41 PM  Pager: [phone]

## 2012-11-23 DIAGNOSIS — R609 Edema, unspecified: Secondary | ICD-10-CM

## 2012-11-23 LAB — BASIC METABOLIC PANEL
BUN: 6 mg/dL (ref 6–23)
CO2: 23 mEq/L (ref 19–32)
Chloride: 111 mEq/L (ref 96–112)
Glucose, Bld: 89 mg/dL (ref 70–99)
Potassium: 4.5 mEq/L (ref 3.5–5.1)

## 2012-11-23 LAB — CBC
HCT: 27.7 % — ABNORMAL LOW (ref 39.0–52.0)
MCH: 31.1 pg (ref 26.0–34.0)
MCV: 91.7 fL (ref 78.0–100.0)
Platelets: 136 10*3/uL — ABNORMAL LOW (ref 150–400)
RBC: 3.02 MIL/uL — ABNORMAL LOW (ref 4.22–5.81)

## 2012-11-23 MED ORDER — WARFARIN SODIUM 2.5 MG PO TABS
2.5000 mg | ORAL_TABLET | Freq: Once | ORAL | Status: AC
  Administered 2012-11-23: 2.5 mg via ORAL
  Filled 2012-11-23: qty 1

## 2012-11-23 NOTE — Progress Notes (Signed)
Clinical Social Work Department CLINICAL SOCIAL WORK PLACEMENT NOTE 11/23/2012  Patient:  Devin Williams, Devin Williams  Account Number:  [id number] Admit date:  11/17/2012  Clinical Social Worker:  Doroteo Glassman  Date/time:  11/23/2012 03:11 PM  Clinical Social Work is seeking post-discharge placement for this patient at the following level of care:   SKILLED NURSING   (*CSW will update this form in Epic as items are completed)   11/23/2012  Patient/family provided with [facility] Department of Clinical Social Work's list of facilities offering this level of care within the geographic area requested by the patient (or if unable, by the patient's family).  11/23/2012  Patient/family informed of their freedom to choose among providers that offer the needed level of care, that participate in Medicare, Medicaid or managed care program needed by the patient, have an available bed and are willing to accept the patient.  11/23/2012  Patient/family informed of [facility]' ownership interest in [facility], as well as of the fact that they are under no obligation to receive care at this facility.  PASARR submitted to EDS on  PASARR number received from EDS on   FL2 transmitted to all facilities in geographic area requested by pt/family on  11/23/2012 FL2 transmitted to all facilities within larger geographic area on   Patient informed that his/her managed care company has contracts with or will negotiate with  certain facilities, including the following:     Patient/family informed of bed offers received:   Patient chooses bed at  Physician recommends and patient chooses bed at    Patient to be transferred to  on   Patient to be transferred to facility by   The following physician request were entered in Epic:   Additional Comments:  Providence Crosby, Theresia Majors Clinical Social Work [phone]

## 2012-11-23 NOTE — Progress Notes (Addendum)
ANTICOAGULATION CONSULT NOTE - Follow Up Consult  Pharmacy Consult for Heparin/Warfarin Indication: DVTs  No Known Allergies  Patient Measurements: Height: 5\' 9"  (175.3 cm) Weight: 214 lb 15.2 oz (97.5 kg) IBW/kg (Calculated) : 70.7 Heparin Dosing Weight: =91.1kg  Vital Signs: Temp: 98.2 F (36.8 C) (03/23 0500) Temp src: Oral (03/23 0500) BP: 123/66 mmHg (03/23 0500) Pulse Rate: 95 (03/23 0500)  Labs:  Recent Labs  11/21/12 0440 11/22/12 0532 11/22/12 1354 11/22/12 2006 11/23/12 0519  HGB 9.9* 8.9*  --   --  9.4*  HCT 28.7* 25.5*  --   --  27.7*  PLT 123* 114*  --   --  136*  LABPROT 19.6* 20.9*  --   --  27.1*  INR 1.72* 1.88*  --   --  2.67*  HEPARINUNFRC 0.42 0.24* 0.43 0.36 0.46  CREATININE 2.97* 2.54*  --   --  2.11*    Estimated Creatinine Clearance: 40.2 ml/min (by C-G formula based on Cr of 2.11).  Assessment: 66 yom on D#6 Coumadin/IV Heparin bridge for bilateral lower extremity DVTs, with mobile, occlusive DVT in left. VQ scan shows low probability of PE.  Hgb and plts low but stable. No bleeding reported per RN. Suspect patient may have underlying alcoholic cirrhosis  Heparin level today remains therapeutic at an infusion rate of 1150 units/hr.  INR also is therapeutic at 2.67  Renal: SCr continues to improve, CrCl now ~40 ml/min.   Goal of Therapy:  INR 2-3 Heparin level 0.3-0.7 units/ml Monitor platelets by anticoagulation protocol: Yes   Plan:  1.  Continue IV heparin at current rate.  2.  Coumadin 2.5mg  po x 1 tonight 3.  Recommend d/c IV heparin tomorrow if INR therapeutic x 48 hours 4.  F/u daily heparin levels, INR, CBC, clinical course  Haynes Hoehn, PharmD 11/23/2012 7:16 AM  Pager: [phone]

## 2012-11-23 NOTE — Progress Notes (Signed)
Attempted to meet with Pt.  Pt on the phone and indicated that he'd like for me to come back at a later time.  Providence Crosby, LCSWA Clinical Social Work [phone]

## 2012-11-23 NOTE — Progress Notes (Signed)
Clinical Social Work Department BRIEF PSYCHOSOCIAL ASSESSMENT 11/23/2012  Patient:  Devin Williams, Devin Williams     Account Number:  [id number]     Admit date:  11/17/2012  Clinical Social Worker:  Doroteo Glassman  Date/Time:  11/23/2012 03:08 PM  Referred by:  Physician  Date Referred:  11/23/2012 Referred for  SNF Placement   Other Referral:   Interview type:  Patient Other interview type:    PSYCHOSOCIAL DATA Living Status:  WIFE Admitted from facility:   Level of care:   Primary support name:  Mrs. Vuncannon Primary support relationship to patient:  SPOUSE Degree of support available:   adequate    CURRENT CONCERNS Current Concerns  Post-Acute Placement   Other Concerns:    SOCIAL WORK ASSESSMENT / PLAN Met with Pt to discuss d/c plans.    Discussed SNF process and what Pt could expect from services provided at a SNF. Pt was agreeable to SNF search and stated that he'd consider SNF placement; he's not entirely sold on the idea.    CSW provided Pt with [place] SNF list for his review.    Weekday CSW to provide bed offers.    CSW thanked Pt for his time.   Assessment/plan status:  Psychosocial Support/Ongoing Assessment of Needs Other assessment/ plan:   Information/referral to community resources:   [facility] SNF list    PATIENT'S/FAMILY'S RESPONSE TO PLAN OF CARE: Pt thanked CSW for time and assistance.   Providence Crosby, LCSWA Clinical Social Work [phone]

## 2012-11-23 NOTE — Progress Notes (Signed)
[facility] PROGRESS NOTE  Devin Williams NGE:[id number] DOB: [birth year] DOA: 11/17/2012 PCP: No primary provider on file.  Assessment/Plan: Shortness of breath with bilateral DVTs  -D/C heparin drip 11/24/12 if INR therapeutic in am  -Due to the patient's history of noncompliance with medical therapy and followup, plan to keep the patient in the hospital until INR is therapeutic  -Oxygen saturation 95-100% on room air  -VQ scan low probability PE  -Continue heparin drip--today is D#6 for overlap with warfarin  -Continue warfarin  -Patient took only 2 months of warfarin from his prior episodes of DVT  -Coumadin per pharmacy consult  -Suspect a component of deconditioning for SOB  -PT evaluation-->SNF -patient appears to have reconsidered SNF option-->consult social work Acute on chronic renal failure  -Abdominal ultrasound-reveals fatty liver without any hydronephrosis, normal spleen  -Random urine sodium and urine creatinine--FeNa = 0.24%  -Likely multifactorial including use of ibuprofen and lisinopril  -Informs taking about 2 tablets of Motrin daily for past one week for his leg pain.  -Continue IV fluids-->renal function improving  Upper extremity edema-right -venous ultrasound -Hyperkalemia  -Improved with Kayexalate and IVF  -Continue telemetry UA unremarkable.  Elevated proBNP  -Clinically euvolemic  -Give fluid challenge--> continue fluids--> no signs of fluid overload  -Echocardiogram EF 45-50%, grade 1 diastolic dysfunction, moderately dilated RV  -Likely due to chronic kidney disease  Elevated lactate  -Recheck lactic acid--> 1.5  Repeat lactate level still 3.7 Although improved likely from admission.Patient does not have any GI symptoms.  -Suspect volume depletion induced  Alcohol use/abuse  -Informs drinking up to 1 pint 3 times a day. No signs of withdrawal. Continue CIWA  -Continue thiamine  -No signs of withdrawal since hospitalization  -Has little  desire to quit  CAD status post CABG  Asymptomatic. Serial cardiac enzymes negative. No EKG changes  Hypertension  Holding lisinopril given AKI  -Continue metoprolol tartrate  Thrombocytopenia  -Likely due to a combination of DVT as well as suspected underlying alcoholic cirrhosis  -Continue to monitor  Hypomagnesemia  -replete  Deconditioning  -Continue physical therapy  -Patient refuses SNF placement initially, appears to be reconsidering today-->consult social work Disposition Plan: Home vs SNF when medically stable         Procedures/Studies: Dg Chest 2 View  11/17/2012  *RADIOLOGY REPORT*  Clinical Data: Cough  CHEST - 2 VIEW  Comparison: 07/03/2009  Findings: Postop CABG.  Negative for heart failure.  Lungs are clear without infiltrate or effusion.  Multiple healed chronic rib fractures.  IMPRESSION: No acute cardiopulmonary abnormality.   Original Report Authenticated By: [name], [name]    US Abdomen Complete  11/19/2012  *RADIOLOGY REPORT*  Clinical Data:  Acute renal failure.  All views.  COMPLETE ABDOMINAL ULTRASOUND  Comparison:  Renal ultrasound dated 11/14/2004  Findings:  Gallbladder:  Multiple gallstones.  No gallbladder wall thickening. Negative sonographic Murphy's sign.  Common bile duct:  Limited visualization of the common bile duct. The visualized segment is 6.1 mm in diameter.  Liver:  The liver is diffusely echogenic.  The left lobe is not well seen.  No mass lesions.  IVC:  Not visualized.  Pancreas:  Not visualized.  Spleen:  Normal.  6.5 cm.  Right Kidney:  Normal.  11.2 cm in length.  Left Kidney:  Normal.  10.8 cm in length.  Abdominal aorta:  A small segment was visualized in the mid abdomen and measures 2.2 cm in diameter.  IMPRESSION: Cholelithiasis.  Hepatic steatosis.  IVC  and pancreas are not visible.  The kidneys appear normal and unchanged since the prior exam.   Original Report Authenticated By: [name], [name]    Nm Pulmonary Perf And  Vent  11/18/2012  *RADIOLOGY REPORT*  Clinical Data: Shortness of breath  NM PULMONARY VENTILATION AND PERFUSION SCAN  Views:  Anterior, posterior, right lateral, left lateral, RAO, LAO, RPO, LPO - ventilation and perfusion  Radiopharmaceutical: Technetium 57m DTPA - ventilation; technetium 49m macroaggregated albumin - perfusion  Dose:  41.0 mCi - ventilation; 5.5 mCi - perfusion  Route of administration:  Inhalation - ventilation; intravenous - perfusion  Comparison: Chest radiograph November 17, 2012  Findings:  The ventilation study shows homogeneous and symmetric uptake of radiotracer bilaterally.  On the perfusion study, there are no segmental perfusion defects. There is photopenia in the major fissures bilaterally.  This is not a finding that is typical for pulmonary embolus, and is more suggestive of fluid in these areas.  IMPRESSION:  Question fluid of both major fissures, a finding not seen on recent chest radiograph.  There are no appreciable segmental ventilation / perfusion mismatches on this study.  This study is felt to constitute an overall low probability of pulmonary embolus.   Original Report Authenticated By: Bretta Bang, [name]          Subjective: Patient complains of right upper extremity edema. He states that the arm does not hurt. Denies any fevers, chills, chest pain, shortness of breath, nausea, vomiting, diarrhea, abdominal pain, dysuria, hematuria.  Objective: Filed Vitals:   11/22/12 1427 11/22/12 2227 11/23/12 0500 11/23/12 0927  BP: 128/77 126/68 123/66 111/85  Pulse: 90 98 95 96  Temp: 97.9 F (36.6 C) 98.4 F (36.9 C) 98.2 F (36.8 C) 97.6 F (36.4 C)  TempSrc: Oral Oral Oral Oral  Resp: 18 18 18 22   Height:      Weight:   97.5 kg (214 lb 15.2 oz)   SpO2: 100% 100% 100% 100%    Intake/Output Summary (Last 24 hours) at 11/23/12 1225 Last data filed at 11/23/12 1610  Gross per 24 hour  Intake 3458.38 ml  Output    450 ml  Net 3008.38 ml   Weight  change: 0.1 kg (3.5 oz) Exam:   General:  Pt is alert, follows commands appropriately, not in acute distress  HEENT: No icterus, No thrush, No neck mass, Lydia/AT  Cardiovascular: RRR, S1/S2, no rubs, no gallops  Respiratory: CTA bilaterally, no wheezing, no crackles, no rhonchi  Abdomen: Soft/+BS, non tender, non distended, no guarding  Extremities: 1+ bilateral lower extremity edema. 2+ right upper extremity edema. 1+ left upper extremity edema. No lymphangitis, No petechiae, No rashes, no synovitis  -Capillary refill less than 2 seconds in the hands, radial pulse palpable bilaterally  Data Reviewed: Basic Metabolic Panel:  Recent Labs Lab 11/19/12 0340 11/20/12 0507 11/21/12 0440 11/22/12 0532 11/23/12 0519  NA 132* 137 140 140 141  K 4.4 4.2 4.2 4.2 4.5  CL 98 104 109 111 111  CO2 26 22 23 21 23   GLUCOSE 77 76 90 85 89  BUN 12 12 11 8 6   CREATININE 2.82* 3.11* 2.97* 2.54* 2.11*  CALCIUM 8.1* 7.8* 8.1* 7.9* 8.3*  MG  --  1.3* 1.5 1.4* 1.7  PHOS  --  3.0  --   --   --    Liver Function Tests:  Recent Labs Lab 11/17/12 1545 11/17/12 1805  AST 149* 111*  ALT 62* 50  ALKPHOS 184* 163*  BILITOT 0.6 0.6  PROT 8.0 6.7  ALBUMIN 3.1* 2.7*   No results found for this basename: LIPASE, AMYLASE,  in the last 168 hours No results found for this basename: AMMONIA,  in the last 168 hours CBC:  Recent Labs Lab 11/17/12 1539  11/19/12 0340 11/20/12 0507 11/21/12 0440 11/22/12 0532 11/23/12 0519  WBC 5.2  < > 4.4 3.9* 4.3 4.6 5.3  NEUTROABS 3.6  --   --   --   --   --   --   HGB 14.5  < > 11.0* 9.8* 9.9* 8.9* 9.4*  HCT 42.4  < > 32.1* 29.0* 28.7* 25.5* 27.7*  MCV 91.6  < > 89.7 90.3 91.1 90.4 91.7  PLT 184  < > 119* 116* 123* 114* 136*  < > = values in this interval not displayed. Cardiac Enzymes:  Recent Labs Lab 11/17/12 1545 11/17/12 2344 11/18/12 0525 11/18/12 1150 11/18/12 1644 11/19/12 1018  CKTOTAL  --   --   --   --  75 69  TROPONINI <0.30 <0.30  <0.30 <0.30  --   --    BNP: No components found with this basename: POCBNP,  CBG: No results found for this basename: GLUCAP,  in the last 168 hours  Recent Results (from the past 240 hour(s))  URINE CULTURE     Status: None   Collection Time    11/17/12  5:42 PM      Result Value Range Status   Specimen Description URINE, RANDOM   Final   Special Requests NONE   Final   Culture  Setup Time 11/18/2012 02:11   Final   Colony Count 3,000 COLONIES/ML   Final   Culture INSIGNIFICANT GROWTH   Final   Report Status 11/18/2012 FINAL   Final  MRSA PCR SCREENING     Status: None   Collection Time    11/18/12  1:06 AM      Result Value Range Status   MRSA by PCR NEGATIVE  NEGATIVE Final   Comment:            The GeneXpert MRSA Assay (FDA     approved for NASAL specimens     only), is one component of a     comprehensive MRSA colonization     surveillance program. It is not     intended to diagnose MRSA     infection nor to guide or     monitor treatment for     MRSA infections.     Scheduled Meds: . acitretin  25 mg Oral QAC breakfast  . aspirin EC  81 mg Oral Daily  . atorvastatin  20 mg Oral QHS  . feeding supplement  237 mL Oral BID BM  . fenofibrate  160 mg Oral Daily  . folic acid  1 mg Oral Daily  . hydrocortisone cream  1 application Topical BID  . metoprolol  25 mg Oral BID  . multivitamin with minerals  1 tablet Oral Daily  . pantoprazole  40 mg Oral Daily  . sodium chloride  3 mL Intravenous Q12H  . sodium polystyrene  30 g Oral Once  . thiamine  100 mg Oral Daily   Or  . thiamine  100 mg Intravenous Daily  . warfarin  2.5 mg Oral ONCE-1800  . warfarin   Does not apply Once  . Warfarin - Pharmacist Dosing Inpatient   Does not apply q1800   Continuous Infusions: . sodium chloride 75 mL/hr at 11/22/12  1610  . heparin 1,150 Units/hr (11/22/12 1403)     Machell Wirthlin, DO  [facility] Pager [phone]  If 7PM-7AM, please contact  night-coverage [email or web address] Password [facility] 11/23/2012, 12:25 PM   LOS: 6 days

## 2012-11-23 NOTE — Progress Notes (Signed)
VASCULAR LAB PRELIMINARY  PRELIMINARY  PRELIMINARY  PRELIMINARY  Right upper extremity venous Doppler completed.    Preliminary report:  There is no DVT or SVT noted in the right upper extremity.  Devin Williams, RVT 11/23/2012, 5:43 PM

## 2012-11-24 LAB — HEPARIN LEVEL (UNFRACTIONATED): Heparin Unfractionated: 0.39 IU/mL (ref 0.30–0.70)

## 2012-11-24 LAB — CBC
Hemoglobin: 9.6 g/dL — ABNORMAL LOW (ref 13.0–17.0)
MCH: 31 pg (ref 26.0–34.0)
MCHC: 34 g/dL (ref 30.0–36.0)
Platelets: 141 10*3/uL — ABNORMAL LOW (ref 150–400)
RDW: 14.7 % (ref 11.5–15.5)

## 2012-11-24 LAB — BASIC METABOLIC PANEL
BUN: 5 mg/dL — ABNORMAL LOW (ref 6–23)
Calcium: 8.3 mg/dL — ABNORMAL LOW (ref 8.4–10.5)
Creatinine, Ser: 1.87 mg/dL — ABNORMAL HIGH (ref 0.50–1.35)
GFR calc Af Amer: 42 mL/min — ABNORMAL LOW (ref >90)
GFR calc non Af Amer: 36 mL/min — ABNORMAL LOW (ref >90)

## 2012-11-24 LAB — MAGNESIUM: Magnesium: 1.5 mg/dL (ref 1.5–2.5)

## 2012-11-24 LAB — PROTIME-INR
INR: 3.6 — ABNORMAL HIGH (ref 0.00–1.49)
Prothrombin Time: 33.8 seconds — ABNORMAL HIGH (ref 11.6–15.2)

## 2012-11-24 MED ORDER — METOPROLOL TARTRATE 25 MG PO TABS: 25.0000 mg | ORAL_TABLET | Freq: Two times a day (BID) | ORAL | Status: DC

## 2012-11-24 MED ORDER — FOLIC ACID 1 MG PO TABS: 1.0000 mg | ORAL_TABLET | Freq: Every day | ORAL | Status: DC

## 2012-11-24 MED ORDER — FENOFIBRATE 160 MG PO TABS: 160.0000 mg | ORAL_TABLET | Freq: Every day | ORAL | Status: DC

## 2012-11-24 NOTE — Discharge Summary (Signed)
Physician Discharge Summary  Devin Williams ZOX:[id number] DOB: 05-20-1946 DOA: 11/17/2012  PCP: No primary provider on file.  Admit date: 11/17/2012 Discharge date: 11/24/2012  Recommendations for Outpatient Follow-up:  1. Pt will need to follow up with PCP in 2 weeks post discharge 2. Please obtain BMP to evaluate electrolytes and kidney function Please also check CBC to evaluate Hg and Hct levels  Nursing facility to check INR 11/25/12 and restart coumadin 2.5mg  if deemed appropriate by PharmD/MD--further dosing per nursing facility dosing  Discharge Diagnoses:  Principal Problem:   DVT (deep venous thrombosis) Active Problems:   Renal failure (ARF), acute on chronic   CAD (coronary artery disease)   Alcoholism   HTN (hypertension)   Hyperlipidemia   Acute on chronic renal failure   Lactic acid acidosis Shortness of breath with bilateral DVTs  Patient admitted to step down with concern for PE given tachycardia- and shortness of breath. The patient was moved to telemetry floor on 11/20/2012 -Bilateral lower extremity ultrasound showed bilateral acute DVTs -The patient never had hypoxemia or respiratory distress, but he continued to have some dyspnea on exertion -Patient stated that his shortness of breath improved throughout the hospitalization; chest x-ray did not show any acute abnormalities -Due to the patient's history of noncompliance with medical therapy and followup, plan to keep the patient in the hospital until INR is therapeutic  -The patient was started on a heparin drip, and warfarin was started later that evening. The received overlap therapy with both heparin and warfarin for 6 days; pharmacy staff assisted in dosing heparin and warfarin -Heparin drip was discontinued after 2 separate INRs were noted above 2.0 -Oxygen saturation 95-100% on room air  -VQ scan low probability PE  -Continue warfarin  -Patient took only 2 months of warfarin from his prior episodes of DVT  in November 2012 and June 2013, both in the right lower extremity -Coumadin per pharmacy consult  -Suspect a component of deconditioning for SOB as patient's shortness of breath continued to improve throughout the hospitalization -PT evaluation-->SNF, but patient refuses  Acute on chronic renal failure  -Initial serum creatinine at the time of admission 2.76 -The patient was started on intravenous fluids -Serum creatinine 1.87 on the day of discharge -Lisinopril was discontinued and will not be restarted -Abdominal ultrasound-reveals fatty liver without any hydronephrosis, normal spleen  -Random urine sodium and urine creatinine--FeNa = 0.24%  -Likely multifactorial including use of ibuprofen and lisinopril  -Informs taking about 2 tablets of Motrin daily for past one week for his leg pain.  -Continue IV fluids-->renal function improving  -CPK 69 -Hyperkalemia  -At time of initial admission, the patient was noted to have potassium 6.6 -Improved with Kayexalate and IVF  -Continue telemetry, UA unremarkable.  Elevated proBNP  -Clinically euvolemic; no orthopnea, PND, worsening edema -The patient did not become hypoxemic, and his lungs remained clear -Give fluid challenge--> continued fluids throughout hospitalization--> no signs of fluid overload or hypoxemia--remained 100% on RA -Echocardiogram EF 45-50%, grade 1 diastolic dysfunction, moderately dilated RV  -Likely due to chronic kidney disease  -The patient was started on intravenous fluids without any signs of decompensation or pulmonary edema Elevated lactate  -Initial lactate 4.0--> 5 to be due to volume depletion/hypoperfusion Repeat lactate level still 3.7 Although improved likely from admission.Patient does not have any GI symptoms.  -Recheck lactic acid--> 1.5  Alcohol use/abuse and dependence -Informs drinking up to 1 pint 3 times a day. No signs of withdrawal.  -The patient's last  alcoholic drink was on the day prior to  admission -The patient's wife stated that the patient had numerous people bring him alcohol on a daily basis -Continue thiamine  -No signs of withdrawal since hospitalization  -Has little desire to quit  -At the request of the patient's wife, psychiatry was consulted to see the patient and offered outpatient resources for the patient CAD status post CABG  Asymptomatic. Serial cardiac enzymes negative. No EKG changes  Hypertension  -Amlodipine and lisinopril will not be restarted -Continue metoprolol tartrate 25 mg twice a day -Well controlled during the hospitalization Thrombocytopenia  -Likely due to a combination of DVT as well as suspected underlying alcoholic cirrhosis  -Continue to monitor  Hypomagnesemia  -repleted Peripheral edema -Right upper extremity ultrasound was negative for DVT Deconditioning  -Continue physical therapy who recommended skilled nursing facility versus 24/7 supervision -Patient refuses SNF placement initially, but then reconsidered -Social work assisted in placement at a skilled nursing facility Disposition Plan: Home when medically stable   Discharge Condition: Stable  Disposition: SNF  Diet:heart healthy Wt Readings from Last 3 Encounters:  11/24/12 97.8 kg (215 lb 9.8 oz)    History of present illness:  67 y.o. male history of the DVT diagnosed last May and has not been compliant with his medications presents with complaints of increasing shortness of breath over the last 3-4 days prior to admission. Patient has been feeling short of breath with increased exertion. Denies any chest pain, productive cough, fever, chills. Denies any leg pain or swelling in the lower extremities. He had gone to his PCP today and was referred to the ER because of his symptoms.  D-dimer was noted to be 9.69 and proBNP was 3909 in the emergency department. Dopplers of the lower extremities showed bilateral lower extremity DVT with mobile clot. CT angiogram was unable to  be done because of patient's renal failure. Patient was found to be in acute on chronic renal failure with hyperkalemia. EKG was showing sinus tachycardia. Patient was given Kayexalate. At this time patient has been admitted for further management. Patient denies any nausea vomiting abdominal pain dizziness or loss of consciousness. Presently patient is hemodynamically stable.     Discharge Exam: Filed Vitals:   11/24/12 0652  BP: 127/69  Pulse: 95  Temp: 98.6 F (37 C)  Resp: 18   Filed Vitals:   11/23/12 1404 11/23/12 2138 11/24/12 0500 11/24/12 0652  BP: 128/83 110/62  127/69  Pulse: 86 93  95  Temp: 97.9 F (36.6 C) 99.5 F (37.5 C)  98.6 F (37 C)  TempSrc: Oral Oral  Oral  Resp:  20  18  Height:      Weight:   97.8 kg (215 lb 9.8 oz)   SpO2: 100% 100%  100%   General: A&O x 3, NAD, pleasant, cooperative Cardiovascular: RRR, no rub, no gallop, no S3 Respiratory: CTAB, no wheeze, no rhonchi Abdomen:soft, nontender, nondistended, positive bowel sounds Extremities: Trace edema bilateral lower extremities. 1+ edema right upper extremity. Left upper extremity with trace edema. No lymphangitis or cellulitis on his extremities.  Discharge Instructions      Discharge Orders   Future Orders Complete By Expires     Diet - low sodium heart healthy  As directed     Discharge instructions  As directed     Comments:      Stop amlodipine Stop lisinopril Stop metoprolol 50mg  Start metoprolol 25mg  twice a day No coumadin today;  Nursing facility to check INR  11/25/12 and restart coumadin 2.5mg  if deemed appropriate by PharmD/MD    Increase activity slowly  As directed         Medication List    STOP taking these medications       amLODipine 10 MG tablet  Commonly known as:  NORVASC     lisinopril 20 MG tablet  Commonly known as:  PRINIVIL,ZESTRIL     potassium chloride 10 MEQ tablet  Commonly known as:  K-DUR,KLOR-CON      TAKE these medications       acitretin  25 MG capsule  Commonly known as:  SORIATANE  Take 25 mg by mouth daily before breakfast.     aspirin EC 81 MG tablet  Take 81 mg by mouth daily.     atorvastatin 20 MG tablet  Commonly known as:  LIPITOR  Take 20 mg by mouth at bedtime.     fenofibrate 160 MG tablet  Take 1 tablet (160 mg total) by mouth daily.     Fenofibric Acid 135 MG Cpdr  Take 1 capsule by mouth daily.     folic acid 1 MG tablet  Commonly known as:  FOLVITE  Take 1 tablet (1 mg total) by mouth daily.     hydrocortisone cream 0.5 %  Apply 1 application topically 2 (two) times daily.     metoprolol tartrate 25 MG tablet  Commonly known as:  LOPRESSOR  Take 1 tablet (25 mg total) by mouth 2 (two) times daily.     multivitamin with minerals Tabs  Take 1 tablet by mouth daily.         The results of significant diagnostics from this hospitalization (including imaging, microbiology, ancillary and laboratory) are listed below for reference.    Significant Diagnostic Studies: Dg Chest 2 View  11/17/2012  *RADIOLOGY REPORT*  Clinical Data: Cough  CHEST - 2 VIEW  Comparison: 07/03/2009  Findings: Postop CABG.  Negative for heart failure.  Lungs are clear without infiltrate or effusion.  Multiple healed chronic rib fractures.  IMPRESSION: No acute cardiopulmonary abnormality.   Original Report Authenticated By: [name], [name]    US Abdomen Complete  11/19/2012  *RADIOLOGY REPORT*  Clinical Data:  Acute renal failure.  All views.  COMPLETE ABDOMINAL ULTRASOUND  Comparison:  Renal ultrasound dated 11/14/2004  Findings:  Gallbladder:  Multiple gallstones.  No gallbladder wall thickening. Negative sonographic Murphy's sign.  Common bile duct:  Limited visualization of the common bile duct. The visualized segment is 6.1 mm in diameter.  Liver:  The liver is diffusely echogenic.  The left lobe is not well seen.  No mass lesions.  IVC:  Not visualized.  Pancreas:  Not visualized.  Spleen:  Normal.  6.5 cm.  Right  Kidney:  Normal.  11.2 cm in length.  Left Kidney:  Normal.  10.8 cm in length.  Abdominal aorta:  A small segment was visualized in the mid abdomen and measures 2.2 cm in diameter.  IMPRESSION: Cholelithiasis.  Hepatic steatosis.  IVC and pancreas are not visible.  The kidneys appear normal and unchanged since the prior exam.   Original Report Authenticated By: [name], [name]    Nm Pulmonary Perf And Vent  11/18/2012  *RADIOLOGY REPORT*  Clinical Data: Shortness of breath  NM PULMONARY VENTILATION AND PERFUSION SCAN  Views:  Anterior, posterior, right lateral, left lateral, RAO, LAO, RPO, LPO - ventilation and perfusion  Radiopharmaceutical: Technetium 15m DTPA - ventilation; technetium 57m macroaggregated albumin - perfusion  Dose:  41.0 mCi - ventilation; 5.5 mCi - perfusion  Route of administration:  Inhalation - ventilation; intravenous - perfusion  Comparison: Chest radiograph November 17, 2012  Findings:  The ventilation study shows homogeneous and symmetric uptake of radiotracer bilaterally.  On the perfusion study, there are no segmental perfusion defects. There is photopenia in the major fissures bilaterally.  This is not a finding that is typical for pulmonary embolus, and is more suggestive of fluid in these areas.  IMPRESSION:  Question fluid of both major fissures, a finding not seen on recent chest radiograph.  There are no appreciable segmental ventilation / perfusion mismatches on this study.  This study is felt to constitute an overall low probability of pulmonary embolus.   Original Report Authenticated By: Bretta Bang, [name]      Microbiology: Recent Results (from the past 240 hour(s))  URINE CULTURE     Status: None   Collection Time    11/17/12  5:42 PM      Result Value Range Status   Specimen Description URINE, RANDOM   Final   Special Requests NONE   Final   Culture  Setup Time 11/18/2012 02:11   Final   Colony Count 3,000 COLONIES/ML   Final   Culture INSIGNIFICANT  GROWTH   Final   Report Status 11/18/2012 FINAL   Final  MRSA PCR SCREENING     Status: None   Collection Time    11/18/12  1:06 AM      Result Value Range Status   MRSA by PCR NEGATIVE  NEGATIVE Final   Comment:            The GeneXpert MRSA Assay (FDA     approved for NASAL specimens     only), is one component of a     comprehensive MRSA colonization     surveillance program. It is not     intended to diagnose MRSA     infection nor to guide or     monitor treatment for     MRSA infections.     Labs: Basic Metabolic Panel:  Recent Labs Lab 11/19/12 0340 11/20/12 0507 11/21/12 0440 11/22/12 0532 11/23/12 0519 11/24/12 0458  NA 132* 137 140 140 141 139  K 4.4 4.2 4.2 4.2 4.5 4.2  CL 98 104 109 111 111 111  CO2 26 22 23 21 23 21   GLUCOSE 77 76 90 85 89 75  BUN 12 12 11 8 6  5*  CREATININE 2.82* 3.11* 2.97* 2.54* 2.11* 1.87*  CALCIUM 8.1* 7.8* 8.1* 7.9* 8.3* 8.3*  MG  --  1.3* 1.5 1.4* 1.7 1.5  PHOS  --  3.0  --   --   --   --    Liver Function Tests:  Recent Labs Lab 11/17/12 1545 11/17/12 1805  AST 149* 111*  ALT 62* 50  ALKPHOS 184* 163*  BILITOT 0.6 0.6  PROT 8.0 6.7  ALBUMIN 3.1* 2.7*   No results found for this basename: LIPASE, AMYLASE,  in the last 168 hours No results found for this basename: AMMONIA,  in the last 168 hours CBC:  Recent Labs Lab 11/17/12 1539  11/20/12 0507 11/21/12 0440 11/22/12 0532 11/23/12 0519 11/24/12 0458  WBC 5.2  < > 3.9* 4.3 4.6 5.3 5.2  NEUTROABS 3.6  --   --   --   --   --   --   HGB 14.5  < > 9.8* 9.9* 8.9* 9.4* 9.6*  HCT 42.4  < >  29.0* 28.7* 25.5* 27.7* 28.2*  MCV 91.6  < > 90.3 91.1 90.4 91.7 91.0  PLT 184  < > 116* 123* 114* 136* 141*  < > = values in this interval not displayed. Cardiac Enzymes:  Recent Labs Lab 11/17/12 1545 11/17/12 2344 11/18/12 0525 11/18/12 1150 11/18/12 1644 11/19/12 1018  CKTOTAL  --   --   --   --  75 69  TROPONINI <0.30 <0.30 <0.30 <0.30  --   --    BNP: No  components found with this basename: POCBNP,  CBG: No results found for this basename: GLUCAP,  in the last 168 hours  Time coordinating discharge:  Greater than 30 minutes  Signed:  Sherlin Sonier, DO [facility] Pager: [phone] 11/24/2012, 11:10 AM

## 2012-11-24 NOTE — Progress Notes (Signed)
Patient discharged to [facility], discharge packet prepared by CSW and given to wife for facility. Patient denies any pain/distress. Patient with hx of psoriasis: skin very dry/scaly and patient picks on it and makes it bleed at times.. No pressure ulcer noted. Reported off to [place] Macpie[facility] at [facility].   Patient transported to the facility by wife via personal car/transportation.

## 2012-11-24 NOTE — Progress Notes (Signed)
ANTICOAGULATION CONSULT NOTE - Follow Up Consult  Pharmacy Consult for Heparin/Warfarin Indication: DVTs  No Known Allergies  Patient Measurements: Height: 5\' 9"  (175.3 cm) Weight: 215 lb 9.8 oz (97.8 kg) IBW/kg (Calculated) : 70.7 Heparin Dosing Weight: =91.1kg  Vital Signs: Temp: 98.6 F (37 C) (03/24 0652) Temp src: Oral (03/24 0652) BP: 127/69 mmHg (03/24 0652) Pulse Rate: 95 (03/24 0652)  Labs:  Recent Labs  11/22/12 0532  11/22/12 2006 11/23/12 0519 11/24/12 0458  HGB 8.9*  --   --  9.4* 9.6*  HCT 25.5*  --   --  27.7* 28.2*  PLT 114*  --   --  136* 141*  LABPROT 20.9*  --   --  27.1* 33.8*  INR 1.88*  --   --  2.67* 3.60*  HEPARINUNFRC 0.24*  < > 0.36 0.46 0.39  CREATININE 2.54*  --   --  2.11* 1.87*  < > = values in this interval not displayed.  Estimated Creatinine Clearance: 45.4 ml/min (by C-G formula based on Cr of 1.87).  Assessment: 66 yom on D7 Coumadin and IV Heparin bridge for bilateral lower extremity DVTs, with mobile, occlusive DVT in left. VQ scan shows low probability of PE.   HL therapeutic this AM but INR elevated to 3.6, MD ordered to stop IV heparin. H/H low but stable. No bleeding. Renal function improving. Suspect patient may have underlying alcoholic cirrhosis    Goal of Therapy:  INR 2-3  Monitor platelets by anticoagulation protocol: Yes   Plan:   D/C IV heparin, daily heparin level and CBC (RN notified)  No Coumadin tonight  If patient is discharged, recommend PT/INR tomorrow and resume Coumadin at lower dose (~2.5mg  daily) when INR is trending down into therapeutic range.   Geoffry Paradise, PharmD, BCPS Pager: [phone] 9:30 AM Pharmacy #: [phone]

## 2012-11-24 NOTE — Progress Notes (Signed)
Patient is set to discharge to [facility] today. Patient aware, wife will transport to facility. Discharge packet given to RN, Ify. Patient made aware that facility requires that he arrive no later than 4:00p.   Clinical Social Work Department CLINICAL SOCIAL WORK PLACEMENT NOTE 11/24/2012  Patient:  Devin Williams, Devin Williams  Account Number:  [id number] Admit date:  11/17/2012  Clinical Social Worker:  Doroteo Glassman  Date/time:  11/23/2012 03:11 PM  Clinical Social Work is seeking post-discharge placement for this patient at the following level of care:   SKILLED NURSING   (*CSW will update this form in Epic as items are completed)   11/23/2012  Patient/family provided with [facility] Department of Clinical Social Work's list of facilities offering this level of care within the geographic area requested by the patient (or if unable, by the patient's family).  11/23/2012  Patient/family informed of their freedom to choose among providers that offer the needed level of care, that participate in Medicare, Medicaid or managed care program needed by the patient, have an available bed and are willing to accept the patient.  11/23/2012  Patient/family informed of [facility]' ownership interest in [facility], as well as of the fact that they are under no obligation to receive care at this facility.  PASARR submitted to EDS on 11/24/2012 PASARR number received from EDS on 11/24/2012  FL2 transmitted to all facilities in geographic area requested by pt/family on  11/23/2012 FL2 transmitted to all facilities within larger geographic area on   Patient informed that his/her managed care company has contracts with or will negotiate with  certain facilities, including the following:     Patient/family informed of bed offers received:  11/24/2012 Patient chooses bed at [facility] Physician recommends and patient chooses bed at    Patient to be  transferred to [facility] on  11/24/2012 Patient to be transferred to facility by wife's car  The following physician request were entered in Epic:   Additional Comments:  Unice Bailey, LCSW [facility] Clinical Social Worker cell #: [phone]

## 2012-11-29 ENCOUNTER — Observation Stay (HOSPITAL_COMMUNITY)
Admission: EM | Admit: 2012-11-29 | Discharge: 2012-12-02 | Disposition: A | Discharge status: Home / Self Care | Payer: Medicare Other | Attending: Internal Medicine | Admitting: Internal Medicine

## 2012-11-29 ENCOUNTER — Emergency Department (HOSPITAL_COMMUNITY): Payer: Medicare Other

## 2012-11-29 ENCOUNTER — Encounter (HOSPITAL_COMMUNITY): Payer: Self-pay | Admitting: Emergency Medicine

## 2012-11-29 ENCOUNTER — Observation Stay (HOSPITAL_COMMUNITY): Payer: Medicare Other

## 2012-11-29 DIAGNOSIS — M79609 Pain in unspecified limb: Secondary | ICD-10-CM | POA: Insufficient documentation

## 2012-11-29 DIAGNOSIS — Z7901 Long term (current) use of anticoagulants: Secondary | ICD-10-CM | POA: Insufficient documentation

## 2012-11-29 DIAGNOSIS — M109 Gout, unspecified: Secondary | ICD-10-CM | POA: Insufficient documentation

## 2012-11-29 DIAGNOSIS — R609 Edema, unspecified: Secondary | ICD-10-CM

## 2012-11-29 DIAGNOSIS — N183 Chronic kidney disease, stage 3 unspecified: Secondary | ICD-10-CM | POA: Insufficient documentation

## 2012-11-29 DIAGNOSIS — R5381 Other malaise: Secondary | ICD-10-CM | POA: Insufficient documentation

## 2012-11-29 DIAGNOSIS — D649 Anemia, unspecified: Secondary | ICD-10-CM | POA: Insufficient documentation

## 2012-11-29 DIAGNOSIS — R9431 Abnormal electrocardiogram [ECG] [EKG]: Secondary | ICD-10-CM | POA: Insufficient documentation

## 2012-11-29 DIAGNOSIS — M899 Disorder of bone, unspecified: Secondary | ICD-10-CM | POA: Insufficient documentation

## 2012-11-29 DIAGNOSIS — M79606 Pain in leg, unspecified: Secondary | ICD-10-CM | POA: Diagnosis present

## 2012-11-29 DIAGNOSIS — F1020 Alcohol dependence, uncomplicated: Secondary | ICD-10-CM | POA: Insufficient documentation

## 2012-11-29 DIAGNOSIS — R000 Tachycardia, unspecified: Secondary | ICD-10-CM | POA: Diagnosis present

## 2012-11-29 DIAGNOSIS — N189 Chronic kidney disease, unspecified: Secondary | ICD-10-CM

## 2012-11-29 DIAGNOSIS — I82403 Acute embolism and thrombosis of unspecified deep veins of lower extremity, bilateral: Secondary | ICD-10-CM

## 2012-11-29 DIAGNOSIS — I2510 Atherosclerotic heart disease of native coronary artery without angina pectoris: Secondary | ICD-10-CM | POA: Insufficient documentation

## 2012-11-29 DIAGNOSIS — R29898 Other symptoms and signs involving the musculoskeletal system: Secondary | ICD-10-CM | POA: Diagnosis present

## 2012-11-29 DIAGNOSIS — Z86718 Personal history of other venous thrombosis and embolism: Secondary | ICD-10-CM | POA: Insufficient documentation

## 2012-11-29 DIAGNOSIS — R0602 Shortness of breath: Secondary | ICD-10-CM | POA: Insufficient documentation

## 2012-11-29 DIAGNOSIS — N179 Acute kidney failure, unspecified: Secondary | ICD-10-CM | POA: Insufficient documentation

## 2012-11-29 DIAGNOSIS — I5042 Chronic combined systolic (congestive) and diastolic (congestive) heart failure: Secondary | ICD-10-CM | POA: Insufficient documentation

## 2012-11-29 DIAGNOSIS — M7730 Calcaneal spur, unspecified foot: Secondary | ICD-10-CM | POA: Insufficient documentation

## 2012-11-29 DIAGNOSIS — I82409 Acute embolism and thrombosis of unspecified deep veins of unspecified lower extremity: Principal | ICD-10-CM | POA: Insufficient documentation

## 2012-11-29 DIAGNOSIS — D696 Thrombocytopenia, unspecified: Secondary | ICD-10-CM | POA: Insufficient documentation

## 2012-11-29 DIAGNOSIS — Z91199 Patient's noncompliance with other medical treatment and regimen due to unspecified reason: Secondary | ICD-10-CM | POA: Insufficient documentation

## 2012-11-29 DIAGNOSIS — I129 Hypertensive chronic kidney disease with stage 1 through stage 4 chronic kidney disease, or unspecified chronic kidney disease: Secondary | ICD-10-CM | POA: Insufficient documentation

## 2012-11-29 DIAGNOSIS — E785 Hyperlipidemia, unspecified: Secondary | ICD-10-CM | POA: Insufficient documentation

## 2012-11-29 DIAGNOSIS — K8020 Calculus of gallbladder without cholecystitis without obstruction: Secondary | ICD-10-CM | POA: Insufficient documentation

## 2012-11-29 DIAGNOSIS — K7689 Other specified diseases of liver: Secondary | ICD-10-CM | POA: Insufficient documentation

## 2012-11-29 DIAGNOSIS — I10 Essential (primary) hypertension: Secondary | ICD-10-CM | POA: Diagnosis present

## 2012-11-29 DIAGNOSIS — Z79899 Other long term (current) drug therapy: Secondary | ICD-10-CM | POA: Insufficient documentation

## 2012-11-29 DIAGNOSIS — Z9119 Patient's noncompliance with other medical treatment and regimen: Secondary | ICD-10-CM | POA: Insufficient documentation

## 2012-11-29 HISTORY — DX: Gastro-esophageal reflux disease without esophagitis: K21.9

## 2012-11-29 HISTORY — DX: Ischemic optic neuropathy, unspecified eye: H47.019

## 2012-11-29 HISTORY — DX: Alcohol abuse, uncomplicated: F10.10

## 2012-11-29 HISTORY — DX: Unspecified visual loss: H54.7

## 2012-11-29 HISTORY — DX: Other ill-defined heart diseases: I51.89

## 2012-11-29 HISTORY — DX: Unspecified hearing loss, unspecified ear: H91.90

## 2012-11-29 LAB — CBC WITH DIFFERENTIAL/PLATELET
Basophils Relative: 1 % (ref 0–1)
Eosinophils Absolute: 0.9 10*3/uL — ABNORMAL HIGH (ref 0.0–0.7)
MCH: 30.4 pg (ref 26.0–34.0)
MCHC: 34.5 g/dL (ref 30.0–36.0)
Neutro Abs: 3.8 10*3/uL (ref 1.7–7.7)
Neutrophils Relative %: 55 % (ref 43–77)
Platelets: 242 10*3/uL (ref 150–400)
RBC: 3.58 MIL/uL — ABNORMAL LOW (ref 4.22–5.81)

## 2012-11-29 LAB — HEPATIC FUNCTION PANEL
ALT: 27 U/L (ref 0–53)
Albumin: 2.2 g/dL — ABNORMAL LOW (ref 3.5–5.2)
Alkaline Phosphatase: 208 U/L — ABNORMAL HIGH (ref 39–117)
Indirect Bilirubin: 0.5 mg/dL (ref 0.3–0.9)
Total Protein: 5.6 g/dL — ABNORMAL LOW (ref 6.0–8.3)

## 2012-11-29 LAB — ETHANOL: Alcohol, Ethyl (B): <11 mg/dL (ref 0–11)

## 2012-11-29 LAB — TROPONIN I
Troponin I: <0.3 ng/mL (ref <0.30)
Troponin I: <0.3 ng/mL (ref <0.30)

## 2012-11-29 LAB — BASIC METABOLIC PANEL
BUN: 6 mg/dL (ref 6–23)
Calcium: 8.2 mg/dL — ABNORMAL LOW (ref 8.4–10.5)
GFR calc Af Amer: 59 mL/min — ABNORMAL LOW (ref >90)
GFR calc non Af Amer: 51 mL/min — ABNORMAL LOW (ref >90)
Glucose, Bld: 91 mg/dL (ref 70–99)
Potassium: 4.6 mEq/L (ref 3.5–5.1)
Sodium: 139 mEq/L (ref 135–145)

## 2012-11-29 LAB — TSH: TSH: 0.331 u[IU]/mL — ABNORMAL LOW (ref 0.350–4.500)

## 2012-11-29 LAB — PRO B NATRIURETIC PEPTIDE: Pro B Natriuretic peptide (BNP): 1623 pg/mL — ABNORMAL HIGH (ref 0–125)

## 2012-11-29 LAB — PROTIME-INR: Prothrombin Time: 25.2 seconds — ABNORMAL HIGH (ref 11.6–15.2)

## 2012-11-29 MED ORDER — NITROGLYCERIN 0.4 MG SL SUBL: 0.4000 mg | SUBLINGUAL_TABLET | SUBLINGUAL | Status: DC | PRN

## 2012-11-29 MED ORDER — LORAZEPAM 1 MG PO TABS
1.0000 mg | ORAL_TABLET | Freq: Once | ORAL | Status: AC
  Administered 2012-11-29: 1 mg via ORAL
  Filled 2012-11-29: qty 1

## 2012-11-29 MED ORDER — ACETAMINOPHEN 650 MG RE SUPP: 650.0000 mg | Freq: Four times a day (QID) | RECTAL | Status: DC | PRN

## 2012-11-29 MED ORDER — LISINOPRIL 20 MG PO TABS
20.0000 mg | ORAL_TABLET | Freq: Every day | ORAL | Status: DC
  Administered 2012-11-29 – 2012-11-30 (×2): 20 mg via ORAL
  Filled 2012-11-29 (×3): qty 1

## 2012-11-29 MED ORDER — THIAMINE HCL 100 MG/ML IJ SOLN
100.0000 mg | Freq: Every day | INTRAMUSCULAR | Status: DC
  Filled 2012-11-29 (×4): qty 1

## 2012-11-29 MED ORDER — ATORVASTATIN CALCIUM 20 MG PO TABS
20.0000 mg | ORAL_TABLET | Freq: Every day | ORAL | Status: DC
  Administered 2012-11-29 – 2012-12-01 (×3): 20 mg via ORAL
  Filled 2012-11-29 (×4): qty 1

## 2012-11-29 MED ORDER — DOCUSATE SODIUM 100 MG PO CAPS
100.0000 mg | ORAL_CAPSULE | Freq: Two times a day (BID) | ORAL | Status: DC
  Administered 2012-11-29 – 2012-12-02 (×6): 100 mg via ORAL
  Filled 2012-11-29 (×7): qty 1

## 2012-11-29 MED ORDER — ACETAMINOPHEN 325 MG PO TABS: 650.0000 mg | ORAL_TABLET | Freq: Four times a day (QID) | ORAL | Status: DC | PRN

## 2012-11-29 MED ORDER — VITAMIN B-1 100 MG PO TABS
100.0000 mg | ORAL_TABLET | Freq: Every day | ORAL | Status: DC
  Administered 2012-11-29 – 2012-12-02 (×4): 100 mg via ORAL
  Filled 2012-11-29 (×4): qty 1

## 2012-11-29 MED ORDER — POLYETHYLENE GLYCOL 3350 17 G PO PACK
17.0000 g | PACK | Freq: Every day | ORAL | Status: DC | PRN
  Filled 2012-11-29: qty 1

## 2012-11-29 MED ORDER — ASPIRIN EC 81 MG PO TBEC
81.0000 mg | DELAYED_RELEASE_TABLET | Freq: Every day | ORAL | Status: DC
  Administered 2012-11-29 – 2012-12-02 (×4): 81 mg via ORAL
  Filled 2012-11-29 (×4): qty 1

## 2012-11-29 MED ORDER — LORAZEPAM 1 MG PO TABS
0.0000 mg | ORAL_TABLET | Freq: Four times a day (QID) | ORAL | Status: AC
  Filled 2012-11-29 (×2): qty 1

## 2012-11-29 MED ORDER — CARVEDILOL 25 MG PO TABS
25.0000 mg | ORAL_TABLET | Freq: Two times a day (BID) | ORAL | Status: DC
  Administered 2012-11-29 – 2012-12-02 (×6): 25 mg via ORAL
  Filled 2012-11-29 (×8): qty 1

## 2012-11-29 MED ORDER — SODIUM CHLORIDE 0.9 % IV SOLN
INTRAVENOUS | Status: DC
  Administered 2012-11-29: 14:00:00 via INTRAVENOUS

## 2012-11-29 MED ORDER — FOLIC ACID 1 MG PO TABS
1.0000 mg | ORAL_TABLET | Freq: Every day | ORAL | Status: DC
  Administered 2012-11-29 – 2012-12-02 (×4): 1 mg via ORAL
  Filled 2012-11-29 (×4): qty 1

## 2012-11-29 MED ORDER — MUPIROCIN 2 % EX OINT
TOPICAL_OINTMENT | Freq: Two times a day (BID) | CUTANEOUS | Status: DC
  Administered 2012-11-29 – 2012-12-02 (×6): via TOPICAL
  Filled 2012-11-29: qty 22

## 2012-11-29 MED ORDER — ADULT MULTIVITAMIN W/MINERALS CH
1.0000 | ORAL_TABLET | Freq: Every day | ORAL | Status: DC
  Administered 2012-11-29 – 2012-12-02 (×4): 1 via ORAL
  Filled 2012-11-29 (×4): qty 1

## 2012-11-29 MED ORDER — HYDROXYZINE HCL 10 MG PO TABS
10.0000 mg | ORAL_TABLET | Freq: Three times a day (TID) | ORAL | Status: DC | PRN
  Administered 2012-11-29 – 2012-12-02 (×5): 10 mg via ORAL
  Filled 2012-11-29 (×4): qty 1

## 2012-11-29 MED ORDER — HYDROCORTISONE 0.5 % EX CREA
1.0000 "application " | TOPICAL_CREAM | Freq: Two times a day (BID) | CUTANEOUS | Status: DC
  Administered 2012-11-29 – 2012-12-02 (×5): 1 via TOPICAL
  Filled 2012-11-29: qty 28.35

## 2012-11-29 MED ORDER — WARFARIN SODIUM 2.5 MG PO TABS
2.5000 mg | ORAL_TABLET | Freq: Once | ORAL | Status: AC
  Administered 2012-11-29: 2.5 mg via ORAL
  Filled 2012-11-29: qty 1

## 2012-11-29 MED ORDER — COLCHICINE 0.6 MG PO TABS
1.2000 mg | ORAL_TABLET | Freq: Once | ORAL | Status: AC
  Administered 2012-11-29: 1.2 mg via ORAL
  Filled 2012-11-29: qty 2

## 2012-11-29 MED ORDER — POTASSIUM CHLORIDE ER 10 MEQ PO TBCR
10.0000 meq | EXTENDED_RELEASE_TABLET | Freq: Every day | ORAL | Status: DC
  Administered 2012-11-29 – 2012-12-02 (×4): 10 meq via ORAL
  Filled 2012-11-29 (×4): qty 1

## 2012-11-29 MED ORDER — SODIUM CHLORIDE 0.9 % IJ SOLN
3.0000 mL | Freq: Two times a day (BID) | INTRAMUSCULAR | Status: DC
  Administered 2012-11-29 – 2012-12-02 (×6): 3 mL via INTRAVENOUS

## 2012-11-29 MED ORDER — WARFARIN - PHARMACIST DOSING INPATIENT: Freq: Every day | Status: DC

## 2012-11-29 MED ORDER — LORAZEPAM 1 MG PO TABS: 0.0000 mg | ORAL_TABLET | Freq: Two times a day (BID) | ORAL | Status: DC

## 2012-11-29 MED ORDER — ENSURE COMPLETE PO LIQD
237.0000 mL | Freq: Two times a day (BID) | ORAL | Status: DC
  Administered 2012-11-30 – 2012-12-02 (×4): 237 mL via ORAL

## 2012-11-29 MED ORDER — PREDNISONE 50 MG PO TABS
50.0000 mg | ORAL_TABLET | Freq: Every day | ORAL | Status: DC
  Administered 2012-11-29: 50 mg via ORAL
  Filled 2012-11-29 (×3): qty 1

## 2012-11-29 MED ORDER — OXYCODONE HCL 5 MG PO TABS
5.0000 mg | ORAL_TABLET | ORAL | Status: DC | PRN
  Administered 2012-11-29 – 2012-12-01 (×6): 5 mg via ORAL
  Filled 2012-11-29 (×6): qty 1

## 2012-11-29 NOTE — ED Notes (Signed)
MD at bedside. 

## 2012-11-29 NOTE — H&P (Addendum)
[facility] History and Physical  Devin Williams JYN:[id number] DOB: 1946/05/25 DOA: 11/29/2012  Referring physician:  Dr. Clarene Duke PCP:  No primary provider on file.  Dr. Mirna Mires  Chief Complaint:  Lower extremity edema  HPI:  The patient is a 67 y.o. year-old male with history of DVTs, EtOH abuse, chronic diastolic and systolic heart failure, EtOH abuse and dependence, CAD, HTN, Thrombocytopenia  CKD who presents with lower extremity DVTs.  The patient was last at their baseline health several weeks ago when he developed SOB with exertion.  He was hospitalized from 3/17 to 3/24 where he was found to have bilateral acute DVTs and negative VQ scan.  He was started on warfarin.  Etiology of the DVTs has not been found.  He was supposed to be discharged to SNF, but he refused at the last minute and went home.  After returning home, he developed swelling of his ankles 2-3 hours after he was discharged and they have gotten progressively bigger.  Both forefeet have become tender and painful to walk on.  He almost needed to be carried to the care today that he needed assistance to get to the car to come to the hospital.  He denies orthopnea, PND.  He gets SOB when going to the bathroom about 30-ft, stable from when he was discharged.  In the ER, he was found to have lower extremity edema, but a therapeutic INR and pro-BNP lower than his previous admission.  No pulmonary edema on CXR.  The ER ordered lower extremity duplex to determine if he has had extension of clot which is pending.    He also admits that he started drinking again two days ago and has been noncompliant with his medications since that time.    He was tachycardic in the ER with some ST segment depressions in the anterolateral leads which are new compared to ECG from a few weeks ago.  Denies CP, palpitations, and first troponin negative.    Patient being admitted for abnormal ECG, pain control, further work up of lower extremity  swelling and possible SNF placement.    Review of Systems:  Denies fevers, chills, weight loss or gain.  Has hearing and vision problems.  Blind in left eye.  Wears glasses.  Denies rhinorrhea, sinus congestion, sore throat.  Denies chest pain and palpitations.  Has some wheezing at night.  Occasional cough.  Denies nausea, vomiting, constipation, diarrhea.  Denies dysuria, frequency, urgency, polyuria, polydipsia.  Denies hematemesis, blood in stools, melena, abnormal bruising or bleeding.  Denies lymphadenopathy.  Has foot pain.  Stable skin rash.  Focal numbness of the right hand 1-3 fingers, denies weakness, slurred speech, confusion, facial droop.  Denies anxiety and depression.    Past Medical History  Diagnosis Date  . Hypertension   . DVT (deep venous thrombosis) 11/2012    right and left legs  . Psoriasis   . Chronic kidney disease   . Coronary artery disease   . Hyperlipidemia   . Alcohol abuse, daily use   . Diastolic dysfunction   . GERD (gastroesophageal reflux disease)   . Optic nerve ischemia   . Problems with hearing   . Vision problems    Past Surgical History  Procedure Laterality Date  . Cardiac surgery    . Knee surgery    . Appendectomy    . Coronary artery bypass graft     Social History:  reports that he quit smoking about 12 years ago. His smoking  use included Cigarettes. He smoked 0.50 packs per day. He has never used smokeless tobacco. He reports that  drinks alcohol. He reports that he does not use illicit drugs.  Lives at home 2 story house, normally does not use an assist device, but borrowed his wife's cane recently.  Previously drove.     No Known Allergies  Family History  Problem Relation Age of Onset  . Diabetes Mellitus II Father   . Psoriasis Father   . Scleroderma Mother   . Blindness Maternal Grandfather      Prior to Admission medications   Medication Sig Start Date End Date Taking? Authorizing Provider  amLODipine (NORVASC) 5 MG  tablet Take 2.5 mg by mouth daily.   Yes Historical Provider, MD  aspirin EC 81 MG tablet Take 81 mg by mouth daily.   Yes Historical Provider, MD  atorvastatin (LIPITOR) 20 MG tablet Take 20 mg by mouth at bedtime.   Yes Historical Provider, MD  Choline Fenofibrate (FENOFIBRIC ACID) 135 MG CPDR Take 1 capsule by mouth daily.   Yes Historical Provider, MD  fenofibrate 160 MG tablet Take 1 tablet (160 mg total) by mouth daily. 11/24/12  Yes Catarina Hartshorn, MD  hydrocortisone cream 0.5 % Apply 1 application topically 2 (two) times daily.   Yes Historical Provider, MD  hydrOXYzine (ATARAX/VISTARIL) 10 MG tablet Take 10 mg by mouth 3 (three) times daily as needed for itching.   Yes Historical Provider, MD  lisinopril (PRINIVIL,ZESTRIL) 20 MG tablet Take 20 mg by mouth daily.   Yes Historical Provider, MD  metoprolol (LOPRESSOR) 50 MG tablet Take 50 mg by mouth 2 (two) times daily.   Yes Historical Provider, MD  Multiple Vitamin (MULTIVITAMIN WITH MINERALS) TABS Take 1 tablet by mouth daily.   Yes Historical Provider, MD  potassium chloride (K-DUR) 10 MEQ tablet Take 10 mEq by mouth daily.   Yes Historical Provider, MD  warfarin (COUMADIN) 5 MG tablet Take 10 mg by mouth daily. Pt takes at 5pm daily   Yes Historical Provider, MD   Physical Exam: Filed Vitals:   11/29/12 1332 11/29/12 1524 11/29/12 1650  BP: 155/86 139/84 147/76  Pulse: 133 126 127  Temp: 98.4 F (36.9 C)  99.4 F (37.4 C)  TempSrc: Oral  Oral  Resp:  21 20  Height:   5\' 9"  (1.753 m)  Weight:   100.245 kg (221 lb)  SpO2: 100% 100% 100%     General:  AAM, no acute distress, lying on stretcher  Eyes:  PERRL, anicteric, non-injected.  Muddy sclera  ENT:  Nares clear.  OP clear, non-erythematous without plaques or exudates.  MMM.    Neck:  Supple without TM or JVD.    Lymph:  No cervical, supraclavicular, or submandibular LAD.  Cardiovascular:  RRR, normal S1, S2, without m/r/g.  2+ pulses, warm extremities  Respiratory:   CTA bilaterally without increased WOB.    Abdomen:  NABS.  Soft, ND/NT.    Skin:  Diffuse erythema and scaling with areas of hyper and hypopigmentation on arms and neck.  Legs are erythematous with cracked and peeling skin with some skin breaks on his bilateral feet.    Musculoskeletal:  Normal bulk and tone.  2+ bilateral lower extremity edema, pitting.  Possibly slightly worse on the left foot than the right.    Psychiatric:  A & O x 4.  Appropriate affect.  Neurologic:  CN 3-12 intact.  5/5 strength.  Sensation intact.  Labs on Admission:  Basic Metabolic Panel:  Recent Labs Lab 11/23/12 0519 11/24/12 0458 11/29/12 1355  NA 141 139 139  K 4.5 4.2 4.6  CL 111 111 105  CO2 23 21 21   GLUCOSE 89 75 91  BUN 6 5* 6  CREATININE 2.11* 1.87* 1.40*  CALCIUM 8.3* 8.3* 8.2*  MG 1.7 1.5  --    Liver Function Tests: No results found for this basename: AST, ALT, ALKPHOS, BILITOT, PROT, ALBUMIN,  in the last 168 hours No results found for this basename: LIPASE, AMYLASE,  in the last 168 hours No results found for this basename: AMMONIA,  in the last 168 hours CBC:  Recent Labs Lab 11/23/12 0519 11/24/12 0458 11/29/12 1355  WBC 5.3 5.2 6.8  NEUTROABS  --   --  3.8  HGB 9.4* 9.6* 10.9*  HCT 27.7* 28.2* 31.6*  MCV 91.7 91.0 88.3  PLT 136* 141* 242   Cardiac Enzymes:  Recent Labs Lab 11/29/12 1355  TROPONINI <0.30    BNP (last 3 results)  Recent Labs  11/17/12 1545 11/29/12 1355  PROBNP 3909.0* 1623.0*   CBG: No results found for this basename: GLUCAP,  in the last 168 hours  Radiological Exams on Admission: Dg Chest 2 View  11/29/2012  *RADIOLOGY REPORT*  Clinical Data: Shortness of breath  CHEST - 2 VIEW  Comparison: 11/17/2012  Findings: Previous coronary bypass changes noted.  Remote bilateral healed rib fractures.  Stable heart size and vascularity.  No focal pneumonia, edema, effusion or pneumothorax.  No significant interval change.  IMPRESSION: Stable chest  exam.  No acute process.   Original Report Authenticated By: Judie Petit. Miles Costain, [name]     EKG: Independently reviewed. Sinus tachycardia with ST-segment depression in II and inverted T-waves in inferior leads which are new.  Additional more prominent ST-segment depressions in V4-6.    Assessment/Plan Principal Problem:   Nonspecific abnormal electrocardiogram (ECG) (EKG) Active Problems:   DVT (deep venous thrombosis)   CAD (coronary artery disease)   Alcoholism   HTN (hypertension)   Hyperlipidemia   Acute on chronic renal failure   Lower extremity pain   Normocytic anemia   Sinus tachycardia   Muscular deconditioning   Abnl ECG:  Differential includes ACS in this patient with known CAD, but may also be due to repolarization abnl during period of tachycardia.  Pro-BNP lower than previous.     -  Admit to telemetry -  Cycle troponins -  Daily aspirin -  restart beta blocker -  restart statin -  NTG prn chest pain  Sinus tachycardia:  Was a problem during his previous admission also and at that time his VQ was negative.   -  TSH -  UDS -  Monitor for other signs of EtOH withdrawal -  Restart BB  Lower extremity edema:  DDx includes swelling from DVT, acute on chronic heart failure, proteinuria, hypoalbuminemia, thyroid abnormality, side effect of norvasc.  Creatinine improved and not on NSAIDS.   -  Repeat duplex ordered by ER, however, patient is therapeutic on INR -  ProBNP and clear CXR with lack of other heart failure symptoms suggests this is not from acute heart failure -  UA  -  Albumin:  2.2 low -  TSH  -  D/c norvasc  Foot pain:  May be due to DVT, swelling, gout, psoriatic arthritis.  Very tender on MTP joints and has severe  -  Bilateral MTP XR -  Uric acid: elevated suggesting GOUT. -  Start  steroid taper  -  Oxycodone prn pain -  Avoid colchicine due to atorva and CKD and NSAID due to CKD. -  Recommend allopurinol once flare improves  CAD/HTN/HLD, chronic systolic  and diastolic heart failure -  Continue aspirin, statin, beta blocker -  Continue lisinopril -  Change from metoprolol to carvedilol for better blood pressure control -  Stopped norvasc due to lower extremity swelling  Bilateral DVTs, INR at goal -  Coumadin per pharmacy - Needs malignancy evaluation by PCP in setting of extensive clot and low albumin -  Will add PSA to AM labs  EtOH abuse - Counseled against cessation - CIWA protocol with vitamin supplements  CKD stage 3, creatinine stable from time of discharge -  Minimize nephrotoxins -  Renally dose medications  Deconditioning -  PT/OT evaluation -  May need SNF placement  Normocytic anemia, stable  hgb -  Defer to outpatient  Sever protein-calorie malnutrition:   -  Ensure   Diet:  Healthy heart, 2gm sodium  Access:  PIV IVF:  None Proph:  warfarin  Code Status: full code Family Communication: spoke with patient and wife Disposition Plan: admit to telemetry, PT/OT ordered  Time spent: 75 minutes  Renae Fickle [facility] Pager [phone]  If 7PM-7AM, please contact night-coverage [email or web address] Password [email or web address] 11/29/2012, 5:20 PM       \

## 2012-11-29 NOTE — ED Provider Notes (Signed)
History     CSN: [id number]  Arrival date & time 11/29/12  1326   First MD Initiated Contact with Patient 11/29/12 1332      Chief Complaint  Patient presents with  . Leg Swelling  . DVT     HPI Pt was seen at 1335.   Per pt, c/o gradual onset and worsening of persistent bilat legs "swelling" for the past 3 days. States his legs "hurt so much I can't walk on them" and family states he was unable to walk to the car today and needed to be carried. Pt states he was discharged from the hospital 5 days ago for dx bilat legs DVT's and "my body was shutting down." States he resumed drinking etoh daily since hospital discharge and has not been taking his medications.  Denies CP/palpitations, no SOB/cough, no abd pain, no N/V/D, no back pain, no focal motor weakness, no tingling/numbness in extremities, no injury.     Past Medical History  Diagnosis Date  . Hypertension   . DVT (deep venous thrombosis) 11/2012    right and left legs  . Psoriasis   . Chronic kidney disease   . Coronary artery disease   . Hyperlipidemia   . Alcohol abuse, daily use   . Diastolic dysfunction     Past Surgical History  Procedure Laterality Date  . Cardiac surgery    . Knee surgery    . Appendectomy    . Coronary artery bypass graft      Family History  Problem Relation Age of Onset  . Diabetes Mellitus II Father     History  Substance Use Topics  . Smoking status: Former Smoker    Quit date: 11/17/2000  . Smokeless tobacco: Never Used  . Alcohol Use: Yes     Comment: when I drink I drink for months drinkin g for the past 2 months    Review of Systems ROS: Statement: All systems negative except as marked or noted in the HPI; Constitutional: Negative for fever and chills. ; ; Eyes: Negative for eye pain, redness and discharge. ; ; ENMT: Negative for ear pain, hoarseness, nasal congestion, sinus pressure and sore throat. ; ; Cardiovascular: Negative for chest pain, palpitations, diaphoresis,  dyspnea and +peripheral edema. ; ; Respiratory: Negative for cough, wheezing and stridor. ; ; Gastrointestinal: Negative for nausea, vomiting, diarrhea, abdominal pain, blood in stool, hematemesis, jaundice and rectal bleeding. . ; ; Genitourinary: Negative for dysuria, flank pain and hematuria. ; ; Musculoskeletal: Negative for back pain and neck pain. Negative for deformity and trauma.; ; Skin: Negative for pruritus, rash, abrasions, blisters, bruising and skin lesion.; ; Neuro: Negative for headache, lightheadedness and neck stiffness. Negative for weakness, altered level of consciousness , altered mental status, extremity weakness, paresthesias, involuntary movement, seizure and syncope.       Allergies  Review of patient's allergies indicates no known allergies.  Home Medications   Current Outpatient Rx  Name  Route  Sig  Dispense  Refill  . amLODipine (NORVASC) 5 MG tablet   Oral   Take 2.5 mg by mouth daily.         [name] aspirin EC 81 MG tablet   Oral   Take 81 mg by mouth daily.         [name] atorvastatin (LIPITOR) 20 MG tablet   Oral   Take 20 mg by mouth at bedtime.         . Choline Fenofibrate (FENOFIBRIC ACID) 135 MG CPDR  Oral   Take 1 capsule by mouth daily.         . fenofibrate 160 MG tablet   Oral   Take 1 tablet (160 mg total) by mouth daily.   30 tablet   0   . hydrocortisone cream 0.5 %   Topical   Apply 1 application topically 2 (two) times daily.         . hydrOXYzine (ATARAX/VISTARIL) 10 MG tablet   Oral   Take 10 mg by mouth 3 (three) times daily as needed for itching.         [name] lisinopril (PRINIVIL,ZESTRIL) 20 MG tablet   Oral   Take 20 mg by mouth daily.         . metoprolol (LOPRESSOR) 50 MG tablet   Oral   Take 50 mg by mouth 2 (two) times daily.         . Multiple Vitamin (MULTIVITAMIN WITH MINERALS) TABS   Oral   Take 1 tablet by mouth daily.         . potassium chloride (K-DUR) 10 MEQ tablet   Oral   Take 10 mEq by  mouth daily.         [name] warfarin (COUMADIN) 5 MG tablet   Oral   Take 10 mg by mouth daily. Pt takes at 5pm daily           BP 139/84  Pulse 126  Temp(Src) 98.4 F (36.9 C) (Oral)  Resp 21  SpO2 100%  Physical Exam 1340: Physical examination:  Nursing notes reviewed; Vital signs and O2 SAT reviewed;  Constitutional: Well developed, Well nourished, Well hydrated, In no acute distress; Head:  Normocephalic, atraumatic; Eyes: EOMI, PERRL, No scleral icterus; ENMT: Mouth and pharynx normal, Mucous membranes moist; Neck: Supple, Full range of motion, No lymphadenopathy; Cardiovascular: Regular rate and rhythm, No gallop; Respiratory: Breath sounds coarse & equal bilaterally, No wheezes.  Speaking full sentences with ease, Normal respiratory effort/excursion; Chest: Nontender, Movement normal; Abdomen: Soft, Nontender, Nondistended, Normal bowel sounds; Genitourinary: No CVA tenderness; Extremities: Pulses normal, No tenderness, +2 bilat pedal edema feet to knees with chronic stasis changes. No calf asymmetry. No open wounds.; Neuro: AA&Ox3, vague historian. Major CN grossly intact.  Speech clear. No gross focal motor or sensory deficits in extremities.; Skin: Color normal, Warm, Dry.   ED Course  Procedures       MDM  MDM Reviewed: previous chart, nursing note and vitals Reviewed previous: ECG and labs Interpretation: labs, ECG and x-ray    Date: 11/29/2012  Rate: 118  Rhythm: sinus tachycardia  QRS Axis: normal  Intervals: normal  ST/T Wave abnormalities: nonspecific ST/T changes  Conduction Disutrbances:none  Narrative Interpretation:   Old EKG Reviewed: changes noted; new diffuse ST changes compared to previous EKG dated 11/17/2012.  Results for orders placed during the hospital encounter of 11/29/12  PROTIME-INR      Result Value Range   Prothrombin Time 25.2 (*) 11.6 - 15.2 seconds   INR 2.42 (*) 0.00 - 1.49  BASIC METABOLIC PANEL      Result Value Range   Sodium  139  135 - 145 mEq/L   Potassium 4.6  3.5 - 5.1 mEq/L   Chloride 105  96 - 112 mEq/L   CO2 21  19 - 32 mEq/L   Glucose, Bld 91  70 - 99 mg/dL   BUN 6  6 - 23 mg/dL   Creatinine, Ser 1.61 (*) 0.50 - 1.35 mg/dL  Calcium 8.2 (*) 8.4 - 10.5 mg/dL   GFR calc non Af Amer 51 (*) >90 mL/min   GFR calc Af Amer 59 (*) >90 mL/min  CBC WITH DIFFERENTIAL      Result Value Range   WBC 6.8  4.0 - 10.5 K/uL   RBC 3.58 (*) 4.22 - 5.81 MIL/uL   Hemoglobin 10.9 (*) 13.0 - 17.0 g/dL   HCT 69.6 (*) 29.5 - 28.4 %   MCV 88.3  78.0 - 100.0 fL   MCH 30.4  26.0 - 34.0 pg   MCHC 34.5  30.0 - 36.0 g/dL   RDW 13.2  44.0 - 10.2 %   Platelets 242  150 - 400 K/uL   Neutrophils Relative 55  43 - 77 %   Neutro Abs 3.8  1.7 - 7.7 K/uL   Lymphocytes Relative 17  12 - 46 %   Lymphs Abs 1.1  0.7 - 4.0 K/uL   Monocytes Relative 14 (*) 3 - 12 %   Monocytes Absolute 1.0  0.1 - 1.0 K/uL   Eosinophils Relative 13 (*) 0 - 5 %   Eosinophils Absolute 0.9 (*) 0.0 - 0.7 K/uL   Basophils Relative 1  0 - 1 %   Basophils Absolute 0.0  0.0 - 0.1 K/uL  TROPONIN I      Result Value Range   Troponin I <0.30  <0.30 ng/mL  PRO B NATRIURETIC PEPTIDE      Result Value Range   Pro B Natriuretic peptide (BNP) 1623.0 (*) 0 - 125 pg/mL  ETHANOL      Result Value Range   Alcohol, Ethyl (B) <11  0 - 11 mg/dL    Dg Chest 2 View 03/28/3663  *RADIOLOGY REPORT*  Clinical Data: Shortness of breath  CHEST - 2 VIEW  Comparison: 11/17/2012  Findings: Previous coronary bypass changes noted.  Remote bilateral healed rib fractures.  Stable heart size and vascularity.  No focal pneumonia, edema, effusion or pneumothorax.  No significant interval change.  IMPRESSION: Stable chest exam.  No acute process.   Original Report Authenticated By: Judie Petit. Miles Costain, [name]    Results for BERISH, BOHMAN (MRN [id number]) as of 11/29/2012 14:49  Ref. Range 11/21/2012 04:40 11/22/2012 05:32 11/23/2012 05:19 11/24/2012 04:58 11/29/2012 13:55  BUN Latest Range: 6-23 mg/dL  11 8 6 5  (L) 6  Creatinine Latest Range: 0.50-1.35 mg/dL 5.63 (H) 8.75 (H) 6.43 (H) 1.87 (H) 1.40 (H)    Results for LAMORRIS, KNOBLOCK (MRN [id number]) as of 11/29/2012 14:49  Ref. Range 11/17/2012 15:45 11/29/2012 13:55  Pro B Natriuretic peptide (BNP) Latest Range: 0-125 pg/mL 3909.0 (H) 1623.0 (H)    Results for PAO, HAFFEY (MRN [id number]) as of 11/29/2012 14:49  Ref. Range 11/21/2012 04:40 11/22/2012 05:32 11/23/2012 05:19 11/24/2012 04:58 11/29/2012 13:55  Hemoglobin Latest Range: 13.0-17.0 g/dL 9.9 (L) 8.9 (L) 9.4 (L) 9.6 (L) 10.9 (L)  HCT Latest Range: 39.0-52.0 % 28.7 (L) 25.5 (L) 27.7 (L) 28.2 (L) 31.6 (L)    1520:  Will check bilat LE's vasc US for possible increasing clot burden as cause for symptoms. INR is therapeutic. Tachycardic, tremorous; given ativan for likely etoh withdrawal.  EKG with new NS STTW changes compared with previous. Currently denies any CP. Will admit. Dx and testing d/w pt and family.  Questions answered.  Verb understanding, agreeable to admit.  T/C to [facility]. Malachi Bonds, case discussed, including:  HPI, pertinent PM/SHx, VS/PE, dx testing, ED course and treatment:  Agreeable to observation admit,  requests to write temporary orders, obtain tele bed to team 6.              Laray Anger, DO 11/30/12 [phone]

## 2012-11-29 NOTE — Progress Notes (Signed)
ANTICOAGULATION CONSULT NOTE - Initial Consult  Pharmacy Consult for warfarin Indication: pulmonary embolus and DVT  No Known Allergies  Patient Measurements: Height: 5\' 9"  (175.3 cm) Weight: 221 lb (100.245 kg) IBW/kg (Calculated) : 70.7 Heparin Dosing Weight:   Vital Signs: Temp: 99.4 F (37.4 C) (03/29 1650) Temp src: Oral (03/29 1650) BP: 147/76 mmHg (03/29 1650) Pulse Rate: 127 (03/29 1650)  Labs:  Recent Labs  11/29/12 1355  HGB 10.9*  HCT 31.6*  PLT 242  LABPROT 25.2*  INR 2.42*  CREATININE 1.40*  TROPONINI <0.30    Estimated Creatinine Clearance: 61.4 ml/min (by C-G formula based on Cr of 1.4).   Medical History: Past Medical History  Diagnosis Date  . Hypertension   . DVT (deep venous thrombosis) 11/2012    right and left legs  . Psoriasis   . Chronic kidney disease   . Coronary artery disease   . Hyperlipidemia   . Alcohol abuse, daily use   . Diastolic dysfunction   . GERD (gastroesophageal reflux disease)   . Optic nerve ischemia   . Problems with hearing   . Vision problems    Assessment: 66 YOM admitted with LE edema with recent dx of PE and DVT 11/17/12. Home dose is stated as 5mg .  INR at admission is 2.42  CBC: Hgb=10.9, plts = 242  Per wife patient drinking EtOH and noncompliant with medications  While in hospital INR went to 3.6 with 5mg  daily x 4 doses   Goal of Therapy:  INR 2-3 Monitor platelets by anticoagulation protocol: Yes   Plan:   Coumadin 2.5mg  PI x 1 tonight  Use lower dose d/t likely noncompliance and per previous INR trend  Daily INR  Monitor for bleeding  Consider evaluating need for ASA since on wafarin  Dannielle Huh 11/29/2012,5:08 PM

## 2012-11-29 NOTE — ED Notes (Signed)
`  Pt complains of bilateral leg swelling x 2 days and pain in lower extremities. Pt states "I have blood clots in my legs" Pt reports being diagnosed this week and is currently on coumadin

## 2012-11-29 NOTE — ED Notes (Signed)
Patient stated that he was admitted to the hospital last week because his "body was shutting down". Found that he had DVTs in both lower extremities-on coumadin. States that swelling is new since this past Wednesday.

## 2012-11-29 NOTE — ED Notes (Signed)
Patient transported to X-ray 

## 2012-11-29 NOTE — ED Notes (Signed)
Patient has +3 swelling in legs and feet. Feet are warm.

## 2012-11-29 NOTE — Progress Notes (Signed)
VASCULAR LAB PRELIMINARY  PRELIMINARY  PRELIMINARY  PRELIMINARY  Bilateral lower extremity venous Dopplers completed.    Preliminary report:  There is subacute occlusive DVT noted in the bilateral posterior tibial veins and partially occlusive, subacute DVT noted in the right popliteal vein.  The DVT noted in the left popliteal vein 11/17/12 appears resolved.   Saquan Furtick, RVT 11/29/2012, 5:41 PM

## 2012-11-29 NOTE — ED Notes (Addendum)
Wife at bedside. Wanted to ensure that MD was aware that patient has been drinking alcohol and not taking medications as recently as 3 days ago

## 2012-11-30 DIAGNOSIS — I82409 Acute embolism and thrombosis of unspecified deep veins of unspecified lower extremity: Secondary | ICD-10-CM

## 2012-11-30 DIAGNOSIS — M109 Gout, unspecified: Secondary | ICD-10-CM | POA: Diagnosis present

## 2012-11-30 DIAGNOSIS — R609 Edema, unspecified: Secondary | ICD-10-CM

## 2012-11-30 DIAGNOSIS — R9431 Abnormal electrocardiogram [ECG] [EKG]: Secondary | ICD-10-CM

## 2012-11-30 DIAGNOSIS — I498 Other specified cardiac arrhythmias: Secondary | ICD-10-CM

## 2012-11-30 LAB — URINALYSIS, ROUTINE W REFLEX MICROSCOPIC
Hgb urine dipstick: NEGATIVE
Ketones, ur: NEGATIVE mg/dL
Nitrite: NEGATIVE
Specific Gravity, Urine: 1.021 (ref 1.005–1.030)
Urobilinogen, UA: 1 mg/dL (ref 0.0–1.0)

## 2012-11-30 LAB — CBC
HCT: 29.6 % — ABNORMAL LOW (ref 39.0–52.0)
Hemoglobin: 10.3 g/dL — ABNORMAL LOW (ref 13.0–17.0)
MCH: 31.1 pg (ref 26.0–34.0)
MCV: 89.4 fL (ref 78.0–100.0)
RBC: 3.31 MIL/uL — ABNORMAL LOW (ref 4.22–5.81)
WBC: 3 10*3/uL — ABNORMAL LOW (ref 4.0–10.5)

## 2012-11-30 LAB — BASIC METABOLIC PANEL
CO2: 22 mEq/L (ref 19–32)
Calcium: 8.1 mg/dL — ABNORMAL LOW (ref 8.4–10.5)
Chloride: 105 mEq/L (ref 96–112)
Glucose, Bld: 138 mg/dL — ABNORMAL HIGH (ref 70–99)
Sodium: 136 mEq/L (ref 135–145)

## 2012-11-30 MED ORDER — TRIAMCINOLONE 0.1 % CREAM:EUCERIN CREAM 1:1
TOPICAL_CREAM | Freq: Three times a day (TID) | CUTANEOUS | Status: DC
  Administered 2012-11-30 – 2012-12-02 (×5): via TOPICAL
  Filled 2012-11-30: qty 1

## 2012-11-30 MED ORDER — WARFARIN SODIUM 2.5 MG PO TABS
2.5000 mg | ORAL_TABLET | Freq: Once | ORAL | Status: AC
  Administered 2012-11-30: 2.5 mg via ORAL
  Filled 2012-11-30 (×2): qty 1

## 2012-11-30 MED ORDER — CALCIUM CARBONATE ANTACID 500 MG PO CHEW
1.0000 | CHEWABLE_TABLET | Freq: Three times a day (TID) | ORAL | Status: DC | PRN
  Administered 2012-11-30: 200 mg via ORAL
  Filled 2012-11-30: qty 1

## 2012-11-30 NOTE — Progress Notes (Signed)
ANTICOAGULATION CONSULT NOTE - Initial Consult  Pharmacy Consult for warfarin Indication: pulmonary embolus and DVT  No Known Allergies  Patient Measurements: Height: 5\' 9"  (175.3 cm) Weight: 212 lb 1.6 oz (96.208 kg) IBW/kg (Calculated) : 70.7 Heparin Dosing Weight:   Vital Signs: Temp: 97.4 F (36.3 C) (03/30 0508) Temp src: Oral (03/30 0508) BP: 107/58 mmHg (03/30 0508) Pulse Rate: 83 (03/30 0508)  Labs:  Recent Labs  11/29/12 1355 11/29/12 1715 11/29/12 2252 11/30/12 0500  HGB 10.9*  --   --  10.3*  HCT 31.6*  --   --  29.6*  PLT 242  --   --  205  LABPROT 25.2*  --   --  24.5*  INR 2.42*  --   --  2.33*  CREATININE 1.40*  --   --  1.47*  TROPONINI <0.30 <0.30 <0.30  --     Estimated Creatinine Clearance: 57.3 ml/min (by C-G formula based on Cr of 1.47).   Medical History: Past Medical History  Diagnosis Date  . Hypertension   . DVT (deep venous thrombosis) 11/2012    right and left legs  . Psoriasis   . Chronic kidney disease   . Coronary artery disease   . Hyperlipidemia   . Alcohol abuse, daily use   . Diastolic dysfunction   . GERD (gastroesophageal reflux disease)   . Optic nerve ischemia   . Problems with hearing   . Vision problems    Assessment: 17 YOM admitted with LE edema with recent dx of PE and DVT 11/17/12. Home dose is stated as 5mg .  INR at admission is 2.42, INR today remains at goal with lower dose last night   CBC: Hgb=10.3, plts = 205  Per wife patient drinking EtOH and noncompliant with medications  While in hospital INR went to 3.6 with 5mg  daily x 4 doses   Goal of Therapy:  INR 2-3 Monitor platelets by anticoagulation protocol: Yes   Plan:   Coumadin 2.5mg  PI x 1 tonight  Use lower dose d/t likely noncompliance and per previous INR trend  Daily INR  Monitor for bleeding  Consider evaluating need for ASA since on wafarin  BorgerdingLoma Messing PharmD Pager #: [phone] 2:44 PM 11/30/2012

## 2012-11-30 NOTE — Progress Notes (Signed)
Utilization Review Completed.Devin Williams T3/30/2014  

## 2012-11-30 NOTE — Progress Notes (Signed)
[facility] PROGRESS NOTE  Devin Williams AOZ:[id number] DOB: 04-Aug-1946 DOA: 11/29/2012 PCP: No primary provider on file.  Assessment/Plan: #1. Abnl ECG:  Differential includes ACS in this patient with known CAD, but may also be due to repolarization abnl during period of tachycardia. Pro-BNP lower than previous. Cardiac enzymes neg x 3. Continue ASA, beta blocker, statin, NTG prn. Follow.  #2.Sinus tachycardia:  Was a problem during his previous admission also and at that time his VQ was negative.   TSH 0.331. UDS negative. No signs of ETOH withdrawal. HR improved. Continue beta blocker.  #3. Lower extremity edema:  DDx includes swelling from DVT, acute on chronic heart failure, proteinuria, hypoalbuminemia, thyroid abnormality, side effect of norvasc. Creatinine improved and not on NSAIDS.  - Repeat duplex with no significant change. INR therapeutic. - ProBNP and clear CXR with lack of other heart failure symptoms suggests this is not from acute heart failure  - UA negative for proteinuria. TSH at 0.331 - Albumin: 2.2 low  - D/c norvasc   #4. Gout Very tender on MTP joints. Uric acid elevated. Improved with steriods. - Oxycodone prn pain  - Avoid colchicine due to atorva and CKD and NSAID due to CKD.  - Recommend allopurinol once flare improves.   #5. CAD/HTN/HLD, chronic systolic and diastolic heart failure  - Continue aspirin, statin, beta blocker  - Continue lisinopril  - Change from metoprolol to carvedilol for better blood pressure control  - Stopped norvasc due to lower extremity swelling    #6. Bilateral DVTs, INR at goal  - Coumadin per pharmacy  - Needs malignancy evaluation by PCP in setting of extensive clot and low albumin   #7. EtOH abuse  Continue CIWA protocol.  #8. CKD stage 3, creatinine stable from time of discharge  - Minimize nephrotoxins  - Renally dose medications   #9. Deconditioning  - PT/OT evaluation  - May need SNF placement    #10. Normocytic anemia, stable hgb  - f/u as outpatient   #11. Sever protein-calorie malnutrition:   Continue ensure  #12. Proph:  warfarin for DVT prophylaxis     Code Status: Full Family Communication: Updated patient, no family present. Disposition Plan: SNF vs Home when medically stable.   Consultants:  None  Procedures:  LE dopplers 11/29/12  Plain films of bilateral feet 11/29/12  Antibiotics:  None  HPI/Subjective: Patient states feet pain improving.   Objective: Filed Vitals:   11/29/12 1650 11/29/12 2105 11/30/12 0508 11/30/12 0616  BP: 147/76 102/50 107/58   Pulse: 127 96 83   Temp: 99.4 F (37.4 C) 97.6 F (36.4 C) 97.4 F (36.3 C)   TempSrc: Oral Oral Oral   Resp: 20 18 16    Height: 5\' 9"  (1.753 m)     Weight: 100.245 kg (221 lb)   96.208 kg (212 lb 1.6 oz)  SpO2: 100% 100% 99%     Intake/Output Summary (Last 24 hours) at 11/30/12 1510 Last data filed at 11/30/12 1152  Gross per 24 hour  Intake    960 ml  Output    100 ml  Net    860 ml   Filed Weights   11/29/12 1650 11/30/12 0616  Weight: 100.245 kg (221 lb) 96.208 kg (212 lb 1.6 oz)    Exam:   General:  NAD  Cardiovascular: RRR  Respiratory: CTAB  Abdomen: Soft/NT/ND/+BS  Extremities: LLE with 2 + edema, RLE 1 +edema. Feet less TTP.   Data Reviewed: Basic Metabolic Panel:  Recent  Labs Lab 11/24/12 0458 11/29/12 1355 11/30/12 0500  NA 139 139 136  K 4.2 4.6 4.7  CL 111 105 105  CO2 21 21 22   GLUCOSE 75 91 138*  BUN 5* 6 6  CREATININE 1.87* 1.40* 1.47*  CALCIUM 8.3* 8.2* 8.1*  MG 1.5  --   --    Liver Function Tests:  Recent Labs Lab 11/29/12 1715  AST 65*  ALT 27  ALKPHOS 208*  BILITOT 1.1  PROT 5.6*  ALBUMIN 2.2*   No results found for this basename: LIPASE, AMYLASE,  in the last 168 hours No results found for this basename: AMMONIA,  in the last 168 hours CBC:  Recent Labs Lab 11/24/12 0458 11/29/12 1355 11/30/12 0500  WBC 5.2 6.8 3.0*   NEUTROABS  --  3.8  --   HGB 9.6* 10.9* 10.3*  HCT 28.2* 31.6* 29.6*  MCV 91.0 88.3 89.4  PLT 141* 242 205   Cardiac Enzymes:  Recent Labs Lab 11/29/12 1355 11/29/12 1715 11/29/12 2252  TROPONINI <0.30 <0.30 <0.30   BNP (last 3 results)  Recent Labs  11/17/12 1545 11/29/12 1355  PROBNP 3909.0* 1623.0*   CBG: No results found for this basename: GLUCAP,  in the last 168 hours  No results found for this or any previous visit (from the past 240 hour(s)).   Studies: Dg Chest 2 View  11/29/2012  *RADIOLOGY REPORT*  Clinical Data: Shortness of breath  CHEST - 2 VIEW  Comparison: 11/17/2012  Findings: Previous coronary bypass changes noted.  Remote bilateral healed rib fractures.  Stable heart size and vascularity.  No focal pneumonia, edema, effusion or pneumothorax.  No significant interval change.  IMPRESSION: Stable chest exam.  No acute process.   Original Report Authenticated By: Judie Petit. Miles Costain, [name]    Dg Foot 2 Views Left  11/29/2012  *RADIOLOGY REPORT*  Clinical Data: Left foot pain in the region of the metatarsal phalangeal joints.  No known injury.  History of psoriasis.  LEFT FOOT - 2 VIEW  Comparison: None.  Findings: Patchy osteopenia involving all of the bones of the foot. Diffuse soft tissue swelling, most pronounced dorsally.  No soft tissue gas, bone erosions or periosteal reaction.  Mild to moderate inferior and minimal posterior calcaneal spur formation.  IMPRESSION: Diffuse soft tissue swelling and patchy osteopenia.   Original Report Authenticated By: Beckie Salts, [name]    Dg Foot 2 Views Right  11/29/2012  *RADIOLOGY REPORT*  Clinical Data: Right foot pain in the region of the metatarsal phalangeal joints.  No known injury.  Psoriasis.  RIGHT FOOT - 2 VIEW  Comparison: None.  Findings: Two views of the right foot demonstrate patchy osteopenia in all of the bones of the foot.  There is also diffuse soft tissue swelling dorsally, medially, laterally and in the plantar  aspect of the foot distally.  No soft tissue gas, bone erosions or periosteal reaction.  Mild to moderate inferior calcaneal spur formation.  IMPRESSION: Diffuse soft tissue swelling and patchy osteopenia, as described above.   Original Report Authenticated By: Beckie Salts, [name]     Scheduled Meds: . aspirin EC  81 mg Oral Daily  . atorvastatin  20 mg Oral QHS  . carvedilol  25 mg Oral BID WC  . docusate sodium  100 mg Oral BID  . feeding supplement  237 mL Oral BID BM  . folic acid  1 mg Oral Daily  . hydrocortisone cream  1 application Topical BID  . lisinopril  20 mg Oral Daily  . LORazepam  0-4 mg Oral Q6H   Followed by  . [START ON 12/01/2012] LORazepam  0-4 mg Oral Q12H  . multivitamin with minerals  1 tablet Oral Daily  . mupirocin ointment   Topical BID  . potassium chloride  10 mEq Oral Daily  . predniSONE  50 mg Oral Q breakfast  . sodium chloride  3 mL Intravenous Q12H  . thiamine  100 mg Oral Daily   Or  . thiamine  100 mg Intravenous Daily  . warfarin  2.5 mg Oral ONCE-1800  . Warfarin - Pharmacist Dosing Inpatient   Does not apply q1800   Continuous Infusions:   Principal Problem:   Nonspecific abnormal electrocardiogram (ECG) (EKG) Active Problems:   DVT (deep venous thrombosis)   CAD (coronary artery disease)   Alcoholism   HTN (hypertension)   Hyperlipidemia   Acute on chronic renal failure   Lower extremity pain   Normocytic anemia   Sinus tachycardia   Muscular deconditioning   Gout flare    Time spent: > 35 mins    [facility]  [facility] Pager [phone]. If 7PM-7AM, please contact night-coverage at [email or web address], password [facility] 11/30/2012, 3:10 PM  LOS: 1 day

## 2012-11-30 NOTE — Evaluation (Signed)
Physical Therapy Evaluation Patient Details Name: CANDIDO FLOTT MRN: [id number] DOB: [birth year] Today's Date: 11/30/2012 Time: 1478-2956 PT Time Calculation (min): 13 min  PT Assessment / Plan / Recommendation Clinical Impression  Pt with recent admission for SOB and DVTs and now currently admitted for nonspecific abnormal electrocardiogram.  Pt with hx of noncompliance with meds and alcoholism.  Pt states does not have RW at home to use, so recommended RW for home to assist with safety and pain control (pain reported in "balls of feet").  Pt would benefit from acute PT serivces in order to improve independence and pain with transfers and ambulation in preparation for d/c home.    PT Assessment  Patient needs continued PT services    Follow Up Recommendations  Home health PT    Does the patient have the potential to tolerate intense rehabilitation      Barriers to Discharge        Equipment Recommendations  Rolling walker with 5" wheels    Recommendations for Other Services     Frequency Min 3X/week    Precautions / Restrictions Precautions Precautions: Fall   Pertinent Vitals/Pain Reports pain in "balls of feet", RW assisted with pain control during mobility     Mobility  Bed Mobility Bed Mobility: Not assessed Transfers Transfers: Stand to Sit;Sit to Stand Sit to Stand: 4: Min guard;With upper extremity assist;From bed Stand to Sit: 4: Min guard;With upper extremity assist;To bed Details for Transfer Assistance: pt ambulating out of bathroom upon arrival, very unsteady, reaching with UEs for wall and bed to steady, so had pt sit on bed to get RW for pt Ambulation/Gait Ambulation/Gait Assistance: 4: Min guard Ambulation Distance (Feet): 120 Feet Assistive device: Rolling walker Ambulation/Gait Assistance Details: verbal cues to ambulate inside RW Gait Pattern: Step-through pattern;Trunk flexed Gait velocity: WFL General Gait Details: no SOB reported, pt reports  bil pain in feet near metatarsal heads and states RW assists with pain control    Exercises     PT Diagnosis: Difficulty walking  PT Problem List: Decreased activity tolerance;Decreased balance;Decreased mobility;Pain;Decreased knowledge of use of DME;Decreased safety awareness PT Treatment Interventions: DME instruction;Gait training;Stair training;Functional mobility training;Therapeutic activities;Therapeutic exercise;Patient/family education;Balance training;Neuromuscular re-education   PT Goals Acute Rehab PT Goals PT Goal Formulation: With patient Time For Goal Achievement: 12/07/12 Potential to Achieve Goals: Good Pt will go Sit to Stand: with modified independence PT Goal: Sit to Stand - Progress: Goal set today Pt will go Stand to Sit: with modified independence PT Goal: Stand to Sit - Progress: Goal set today Pt will Ambulate: >150 feet;with modified independence;with least restrictive assistive device PT Goal: Ambulate - Progress: Goal set today Pt will Go Up / Down Stairs: 3-5 stairs;with min assist PT Goal: Up/Down Stairs - Progress: Goal set today  Visit Information  Last PT Received On: 11/30/12 Assistance Needed: +1    Subjective Data  Subjective: I need to order my lunch.   Prior Functioning  Home Living Lives With: Spouse Type of Home: House Home Access: Stairs to enter Secretary/administrator of Steps: did not state Home Layout: One level Home Adaptive Equipment: None Prior Function Level of Independence: Independent Communication Communication: No difficulties    Cognition  Cognition Overall Cognitive Status: Appears within functional limits for tasks assessed/performed Arousal/Alertness: Awake/alert Orientation Level: Appears intact for tasks assessed Behavior During Session: [facility] for tasks performed    Extremity/Trunk Assessment Right Upper Extremity Assessment RUE ROM/Strength/Tone: [facility] for tasks assessed Left Upper Extremity Assessment LUE  ROM/Strength/Tone: [facility] for tasks assessed Right Lower Extremity Assessment RLE ROM/Strength/Tone: [facility] for tasks assessed Left Lower Extremity Assessment LLE ROM/Strength/Tone: [facility] for tasks assessed   Balance    End of Session PT - End of Session Activity Tolerance: Patient tolerated treatment well Patient left: in bed;with call bell/phone within reach;with family/visitor present  GP Functional Assessment Tool Used: clinical judgement Functional Limitation: Mobility: Walking and moving around Mobility: Walking and Moving Around Current Status (Z6109): At least 1 percent but less than 20 percent impaired, limited or restricted Mobility: Walking and Moving Around Goal Status [phone]): 0 percent impaired, limited or restricted   Carney Saxton,KATHrine E 11/30/2012, 3:29 PM Zenovia Jarred, PT, DPT 11/30/2012 Pager: [phone]

## 2012-12-01 LAB — BASIC METABOLIC PANEL
BUN: 8 mg/dL (ref 6–23)
Calcium: 8 mg/dL — ABNORMAL LOW (ref 8.4–10.5)
GFR calc non Af Amer: 33 mL/min — ABNORMAL LOW (ref >90)
Glucose, Bld: 77 mg/dL (ref 70–99)
Sodium: 138 mEq/L (ref 135–145)

## 2012-12-01 LAB — CBC
MCH: 31 pg (ref 26.0–34.0)
MCHC: 34.8 g/dL (ref 30.0–36.0)
Platelets: 215 10*3/uL (ref 150–400)
RBC: 3.06 MIL/uL — ABNORMAL LOW (ref 4.22–5.81)

## 2012-12-01 LAB — PROTIME-INR: Prothrombin Time: 24.4 seconds — ABNORMAL HIGH (ref 11.6–15.2)

## 2012-12-01 LAB — URINE CULTURE

## 2012-12-01 MED ORDER — WARFARIN SODIUM 3 MG PO TABS
3.0000 mg | ORAL_TABLET | Freq: Once | ORAL | Status: AC
  Administered 2012-12-01: 3 mg via ORAL
  Filled 2012-12-01: qty 1

## 2012-12-01 MED ORDER — LORAZEPAM 1 MG PO TABS
1.0000 mg | ORAL_TABLET | Freq: Once | ORAL | Status: AC
  Administered 2012-12-01: 1 mg via ORAL
  Filled 2012-12-01: qty 1

## 2012-12-01 MED ORDER — PREDNISONE 20 MG PO TABS
40.0000 mg | ORAL_TABLET | Freq: Every day | ORAL | Status: DC
  Administered 2012-12-01 – 2012-12-02 (×2): 40 mg via ORAL
  Filled 2012-12-01 (×3): qty 2

## 2012-12-01 NOTE — Progress Notes (Signed)
[facility] PROGRESS NOTE  Devin Williams ZOX:[id number] DOB: [birth year] DOA: 11/29/2012 PCP: No primary provider on file.  Assessment/Plan: #1. Abnl ECG:  Differential includes ACS in this patient with known CAD, but may also be due to repolarization abnl during period of tachycardia. Pro-BNP lower than previous. Cardiac enzymes neg x 3. Continue ASA, beta blocker, statin, NTG prn. Follow.  #2.Sinus tachycardia:  Was a problem during his previous admission also and at that time his VQ was negative.   TSH 0.331. UDS negative. No signs of ETOH withdrawal. HR improved. Continue beta blocker.  #3. Lower extremity edema:  DDx includes swelling from DVT, acute on chronic heart failure, proteinuria, hypoalbuminemia, thyroid abnormality, side effect of norvasc. Creatinine improved and not on NSAIDS.  - Repeat duplex with no significant change. INR therapeutic. - ProBNP and clear CXR with lack of other heart failure symptoms suggests this is not from acute heart failure  - UA negative for proteinuria. TSH at 0.331 - Albumin: 2.2 low  - D/c norvasc   #4. Gout Very tender on MTP joints. Uric acid elevated. Improved with steriods. - Oxycodone prn pain  - Avoid colchicine due to atorva and CKD and NSAID due to CKD.  - Recommend allopurinol once flare improves.   #5. CAD/HTN/HLD, chronic systolic and diastolic heart failure  - Continue aspirin, statin, beta blocker  - Continue lisinopril  - Changed from metoprolol to carvedilol for better blood pressure control  - Stopped norvasc due to lower extremity swelling    #6. Bilateral DVTs, INR at goal  - Coumadin per pharmacy  - Needs malignancy evaluation by PCP in setting of extensive clot and low albumin   #7. EtOH abuse  Continue CIWA protocol.  #8. CKD stage 3, creatinine stable from time of discharge  - Minimize nephrotoxins  - Renally dose medications   #9. Deconditioning  - PT/OT evaluation  - May need SNF placement    #10. Normocytic anemia, stable hgb  - f/u as outpatient   #11. Sever protein-calorie malnutrition:   Continue ensure  #12. Proph:  warfarin for DVT prophylaxis     Code Status: Full Family Communication: Updated patient, no family present. Disposition Plan: SNF vs Home when medically stable.   Consultants:  None  Procedures:  LE dopplers 11/29/12  Plain films of bilateral feet 11/29/12  Antibiotics:  None  HPI/Subjective: Patient states feet pain improving.   Objective: Filed Vitals:   11/30/12 1552 11/30/12 2145 11/30/12 2357 12/01/12 0610  BP: 98/57 99/51  98/49  Pulse: 78 72 75 86  Temp: 98.1 F (36.7 C) 98.2 F (36.8 C)  98.2 F (36.8 C)  TempSrc: Oral Oral  Oral  Resp: 20 18  18   Height:      Weight:    98.6 kg (217 lb 6 oz)  SpO2: 100% 99%  99%    Intake/Output Summary (Last 24 hours) at 12/01/12 1208 Last data filed at 12/01/12 0930  Gross per 24 hour  Intake   1140 ml  Output    300 ml  Net    840 ml   Filed Weights   11/29/12 1650 11/30/12 0616 12/01/12 0610  Weight: 100.245 kg (221 lb) 96.208 kg (212 lb 1.6 oz) 98.6 kg (217 lb 6 oz)    Exam:   General:  NAD  Cardiovascular: RRR  Respiratory: CTAB  Abdomen: Soft/NT/ND/+BS  Extremities: LLE with 1-2 + edema, RLE 1 +edema. Feet less TTP.   Data Reviewed: Basic Metabolic Panel:  Recent Labs Lab 11/29/12 1355 11/30/12 0500 12/01/12 0419  NA 139 136 138  K 4.6 4.7 4.4  CL 105 105 106  CO2 21 22 24   GLUCOSE 91 138* 77  BUN 6 6 8   CREATININE 1.40* 1.47* 1.99*  CALCIUM 8.2* 8.1* 8.0*   Liver Function Tests:  Recent Labs Lab 11/29/12 1715  AST 65*  ALT 27  ALKPHOS 208*  BILITOT 1.1  PROT 5.6*  ALBUMIN 2.2*   No results found for this basename: LIPASE, AMYLASE,  in the last 168 hours No results found for this basename: AMMONIA,  in the last 168 hours CBC:  Recent Labs Lab 11/29/12 1355 11/30/12 0500 12/01/12 0419  WBC 6.8 3.0* 6.7  NEUTROABS 3.8  --    --   HGB 10.9* 10.3* 9.5*  HCT 31.6* 29.6* 27.3*  MCV 88.3 89.4 89.2  PLT 242 205 215   Cardiac Enzymes:  Recent Labs Lab 11/29/12 1355 11/29/12 1715 11/29/12 2252 12/01/12 0700  TROPONINI <0.30 <0.30 <0.30 <0.30   BNP (last 3 results)  Recent Labs  11/17/12 1545 11/29/12 1355  PROBNP 3909.0* 1623.0*   CBG: No results found for this basename: GLUCAP,  in the last 168 hours  No results found for this or any previous visit (from the past 240 hour(s)).   Studies: Dg Chest 2 View  11/29/2012  *RADIOLOGY REPORT*  Clinical Data: Shortness of breath  CHEST - 2 VIEW  Comparison: 11/17/2012  Findings: Previous coronary bypass changes noted.  Remote bilateral healed rib fractures.  Stable heart size and vascularity.  No focal pneumonia, edema, effusion or pneumothorax.  No significant interval change.  IMPRESSION: Stable chest exam.  No acute process.   Original Report Authenticated By: Judie Petit. Miles Costain, [name]    Dg Foot 2 Views Left  11/29/2012  *RADIOLOGY REPORT*  Clinical Data: Left foot pain in the region of the metatarsal phalangeal joints.  No known injury.  History of psoriasis.  LEFT FOOT - 2 VIEW  Comparison: None.  Findings: Patchy osteopenia involving all of the bones of the foot. Diffuse soft tissue swelling, most pronounced dorsally.  No soft tissue gas, bone erosions or periosteal reaction.  Mild to moderate inferior and minimal posterior calcaneal spur formation.  IMPRESSION: Diffuse soft tissue swelling and patchy osteopenia.   Original Report Authenticated By: Beckie Salts, [name]    Dg Foot 2 Views Right  11/29/2012  *RADIOLOGY REPORT*  Clinical Data: Right foot pain in the region of the metatarsal phalangeal joints.  No known injury.  Psoriasis.  RIGHT FOOT - 2 VIEW  Comparison: None.  Findings: Two views of the right foot demonstrate patchy osteopenia in all of the bones of the foot.  There is also diffuse soft tissue swelling dorsally, medially, laterally and in the plantar aspect  of the foot distally.  No soft tissue gas, bone erosions or periosteal reaction.  Mild to moderate inferior calcaneal spur formation.  IMPRESSION: Diffuse soft tissue swelling and patchy osteopenia, as described above.   Original Report Authenticated By: Beckie Salts, [name]     Scheduled Meds: . aspirin EC  81 mg Oral Daily  . atorvastatin  20 mg Oral QHS  . carvedilol  25 mg Oral BID WC  . docusate sodium  100 mg Oral BID  . feeding supplement  237 mL Oral BID BM  . folic acid  1 mg Oral Daily  . hydrocortisone cream  1 application Topical BID  . LORazepam  0-4 mg Oral Q6H  Followed by  . LORazepam  0-4 mg Oral Q12H  . multivitamin with minerals  1 tablet Oral Daily  . mupirocin ointment   Topical BID  . potassium chloride  10 mEq Oral Daily  . predniSONE  40 mg Oral Q breakfast  . sodium chloride  3 mL Intravenous Q12H  . thiamine  100 mg Oral Daily   Or  . thiamine  100 mg Intravenous Daily  . triamcinolone 0.1 % cream : eucerin   Topical TID  . warfarin  3 mg Oral ONCE-1800  . Warfarin - Pharmacist Dosing Inpatient   Does not apply q1800   Continuous Infusions:   Principal Problem:   Nonspecific abnormal electrocardiogram (ECG) (EKG) Active Problems:   DVT (deep venous thrombosis)   CAD (coronary artery disease)   Alcoholism   HTN (hypertension)   Hyperlipidemia   Acute on chronic renal failure   Lower extremity pain   Normocytic anemia   Sinus tachycardia   Muscular deconditioning   Gout flare    Time spent: > 35 mins    [facility]  [facility] Pager [phone]. If 7PM-7AM, please contact night-coverage at [email or web address], password [facility] 12/01/2012, 12:08 PM  LOS: 2 days

## 2012-12-01 NOTE — Progress Notes (Signed)
Received order for rolling walker.  Equipment has been delivered to hospital room for discharge.

## 2012-12-01 NOTE — Progress Notes (Signed)
Physical Therapy Treatment Patient Details Name: Devin Williams MRN: [id number] DOB: Dec 04, 1945 Today's Date: 12/01/2012 Time: 4098-1191 PT Time Calculation (min): 15 min  PT Assessment / Plan / Recommendation Comments on Treatment Session  Pt doing very well with ambulation with RW, however requires max cues for safety when performing stairs.  Educated on safe way to ascend/descend stairs and for wife to assist.     Follow Up Recommendations  Home health PT     Does the patient have the potential to tolerate intense rehabilitation     Barriers to Discharge        Equipment Recommendations  Rolling walker with 5" wheels    Recommendations for Other Services    Frequency Min 3X/week   Plan Discharge plan remains appropriate    Precautions / Restrictions Precautions Precautions: Fall Restrictions Weight Bearing Restrictions: No   Pertinent Vitals/Pain No pain    Mobility  Bed Mobility Bed Mobility: Not assessed (Pt sitting at EOB when PT arrived. ) Transfers Transfers: Stand to Sit;Sit to Stand Sit to Stand: 5: Supervision;With upper extremity assist;From bed Stand to Sit: 5: Supervision;With upper extremity assist;To chair/3-in-1 Details for Transfer Assistance: Supervision for safety with mod cues for hand placement as pt was wanting to pull up on RW.  Ambulation/Gait Ambulation/Gait Assistance: 4: Min guard Ambulation Distance (Feet): 200 Feet Assistive device: Rolling walker Ambulation/Gait Assistance Details: Min cues for upright posture, but pt doing very well with RW.  Gait Pattern: Step-through pattern;Trunk flexed Gait velocity: WFL Stairs: Yes Stairs Assistance: 4: Min assist Stairs Assistance Details (indicate cue type and reason): MAX cues for safety as pt kept wanting to use handrails, however does not have rails at home.  States he holds on to wall, therefore let him hold onto wall and educated on having wife assist on other side.  Stair Management  Technique: One rail Left;Step to pattern;Forwards Number of Stairs: 10    Exercises     PT Diagnosis:    PT Problem List:   PT Treatment Interventions:     PT Goals Acute Rehab PT Goals PT Goal Formulation: With patient Time For Goal Achievement: 12/07/12 Potential to Achieve Goals: Good Pt will go Sit to Stand: with modified independence PT Goal: Sit to Stand - Progress: Progressing toward goal Pt will go Stand to Sit: with modified independence PT Goal: Stand to Sit - Progress: Progressing toward goal Pt will Ambulate: >150 feet;with modified independence;with least restrictive assistive device PT Goal: Ambulate - Progress: Progressing toward goal Pt will Go Up / Down Stairs: 3-5 stairs;with min assist PT Goal: Up/Down Stairs - Progress: Partly met  Visit Information  Last PT Received On: 12/01/12 Assistance Needed: +1    Subjective Data  Subjective: I have a wall I can hold at home.    Cognition  Cognition Overall Cognitive Status: Appears within functional limits for tasks assessed/performed Arousal/Alertness: Awake/alert Orientation Level: Appears intact for tasks assessed Behavior During Session: [facility] for tasks performed    Balance     End of Session PT - End of Session Equipment Utilized During Treatment: Gait belt Activity Tolerance: Patient tolerated treatment well Patient left: in chair;with call bell/phone within reach Nurse Communication: Mobility status   GP Functional Assessment Tool Used: clinical judgement Functional Limitation: Mobility: Walking and moving around Mobility: Walking and Moving Around Current Status (Y7829): At least 1 percent but less than 20 percent impaired, limited or restricted Mobility: Walking and Moving Around Goal Status [phone]): 0 percent impaired, limited or  restricted Mobility: Walking and Moving Around Discharge Status [phone]): At least 1 percent but less than 20 percent impaired, limited or restricted   Vista Deck 12/01/2012, 2:44 PM

## 2012-12-01 NOTE — Evaluation (Signed)
Occupational Therapy Evaluation Patient Details Name: Devin Williams MRN: [id number] DOB: 08-Feb-1946 Today's Date: 12/01/2012 Time: 4098-1191 OT Time Calculation (min): 27 min  OT Assessment / Plan / Recommendation Clinical Impression  Pt presents w/ deficits in functional mobility and transfers related to ADL's. He should benefit from acute OT followed by [facility] for home safety assessment/DME recommendations to assist in maximizing I w/ ADL's.     OT Assessment  Patient needs continued OT Services    Follow Up Recommendations  Home health OT    Barriers to Discharge      Equipment Recommendations  Other (comment) (Cont to assess.)    Recommendations for Other Services    Frequency  Min 2X/week    Precautions / Restrictions Precautions Precautions: Fall Restrictions Weight Bearing Restrictions: No   Pertinent Vitals/Pain 6/10 R foot; pt performed functional activity & was repositioned as well. He reports that he had pain medications earlier. Was educated to notify RN using call bell should pain continue & verbalized understanding of this.    ADL  Eating/Feeding: Performed;Independent Where Assessed - Eating/Feeding: Edge of bed Grooming: Performed;Wash/dry hands;Supervision/safety Where Assessed - Grooming: Supported standing Upper Body Bathing: Simulated;Set up Where Assessed - Upper Body Bathing: Unsupported sitting;Supported sitting Lower Body Bathing: Simulated;Supervision/safety Where Assessed - Lower Body Bathing: Supported sit to stand;Unsupported sitting Upper Body Dressing: Simulated;Set up;Independent Where Assessed - Upper Body Dressing: Unsupported sitting Lower Body Dressing: Performed;Supervision/safety;Other (comment) (Don/doff socks seated EOB) Where Assessed - Lower Body Dressing: Unsupported sitting Toilet Transfer: Performed;Supervision/safety;Other (comment);Min guard (W/ RW & grab bar use) Toilet Transfer Method: Sit to stand Toilet Transfer  Equipment: Comfort height toilet;Grab bars (RW) Toileting - Clothing Manipulation and Hygiene: Supervision/safety;Min guard Where Assessed - Engineer, mining and Hygiene: Sit on 3-in-1 or toilet Tub/Shower Transfer Method: Not assessed Equipment Used: Gait belt;Rolling walker;Other (comment) (Grab bar, discussed 3:1 over toilet, pt declined) Transfers/Ambulation Related to ADLs: Pt amb w/ supervision-min guard level assist using RW due to R LE foot pain, pt w/ wide BOS. Benefits from VC's for safety and sequencing w/ RW as well as hand placement. ADL Comments: Pt currently exhibits decreased functional mobility and transfers related to ADL's and self care tasks. He was educated in Role of OT & DME as well as performed ADL sit EOB for LB dressing. Pt should benefit from acute OT followed by [facility] for safety assessment ans DME recommendations.    OT Diagnosis: Generalized weakness;Acute pain;Other (comment) (Wide base of stance/decreased funct mobility)  OT Problem List: Decreased activity tolerance;Decreased strength;Cardiopulmonary status limiting activity;Decreased knowledge of use of DME or AE;Decreased knowledge of precautions;Other (comment) (H/O non compliance) OT Treatment Interventions: Self-care/ADL training;DME and/or AE instruction;Energy conservation;Therapeutic activities;Patient/family education   OT Goals Acute Rehab OT Goals OT Goal Formulation: With patient Potential to Achieve Goals: Good ADL Goals Pt Will Perform Grooming: with modified independence;Sitting, chair;Sitting, edge of bed;Sitting at sink;Unsupported;with set-up ADL Goal: Grooming - Progress: Goal set today Pt Will Perform Lower Body Dressing: with modified independence;with set-up;Sit to stand from chair;Sit to stand from bed;Other (comment) (w/ a/e PRN) ADL Goal: Lower Body Dressing - Progress: Goal set today Pt Will Transfer to Toilet: with modified independence;with supervision;Ambulation;with  DME;Raised toilet seat with arms;3-in-1 ADL Goal: Toilet Transfer - Progress: Goal set today Pt Will Perform Toileting - Clothing Manipulation: with modified independence;Sitting on 3-in-1 or toilet;Standing ADL Goal: Toileting - Clothing Manipulation - Progress: Goal set today Pt Will Perform Toileting - Hygiene: with modified independence;Sitting on 3-in-1 or toilet ADL  Goal: Toileting - Hygiene - Progress: Goal set today Additional ADL Goal #1: Pt will I state 2-3 energy conservation techniques that will assist w/ ADL's and self care tasks during a functional activity. ADL Goal: Additional Goal #1 - Progress: Goal set today  Visit Information  Last OT Received On: 12/01/12 Assistance Needed: +1    Subjective Data  Subjective: "I don't do any major cooking at home, my wife does that" Patient Stated Goal: Home w/ HH services   Prior Functioning     Home Living Lives With: Spouse Type of Home: House Home Access: Stairs to enter [employer] of Steps: did not state Home Layout: One level Home Adaptive Equipment: None Prior Function Level of Independence: Independent Vocation: Other (comment) ("I work sometimes") Musician: No difficulties Dominant Hand: Right    Vision/Perception Vision - History Baseline Vision:  (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; has bifocals uses PRN) Patient Visual Report: No change from baseline   Cognition  Cognition Overall Cognitive Status: Appears within functional limits for tasks assessed/performed Arousal/Alertness: Awake/alert Orientation Level: Appears intact for tasks assessed Behavior During Session: [facility] for tasks performed    Extremity/Trunk Assessment Right Upper Extremity Assessment RUE ROM/Strength/Tone: [facility] for tasks assessed Left Upper Extremity Assessment LUE ROM/Strength/Tone: [facility] for tasks assessed     Mobility Bed Mobility Bed Mobility: Supine to Sit;Sitting - Scoot to Edge of Bed Supine to Sit: 6:  Modified independent (Device/Increase time);HOB elevated Sitting - Scoot to Edge of Bed: 6: Modified independent (Device/Increase time) Details for Bed Mobility Assistance: increased time Transfers Transfers: Sit to Stand;Stand to Sit Sit to Stand: 5: Supervision;4: Min guard;From bed;From chair/3-in-1;With upper extremity assist Stand to Sit: 4: Min guard;5: Supervision;To chair/3-in-1;To bed;With upper extremity assist       Balance Balance Balance Assessed: Yes Static Sitting Balance Static Sitting - Balance Support: Feet supported;Bilateral upper extremity supported Static Sitting - Level of Assistance: 6: Modified independent (Device/Increase time) Static Standing Balance Static Standing - Balance Support: Bilateral upper extremity supported;During functional activity Static Standing - Level of Assistance: 5: Stand by assistance   End of Session OT - End of Session Equipment Utilized During Treatment: Gait belt;Other (comment) (RW, comfort height toilet, grab bar. Discussed 3:1 use w/ pt) Activity Tolerance: Patient tolerated treatment well Patient left: in bed;with call bell/phone within reach;with bed alarm set (Pt declined sitting up in chair this am)  GO Functional Assessment Tool Used: Clinical judgement Functional Limitation: Self care Self Care Current Status (Z6109): At least 1 percent but less than 20 percent impaired, limited or restricted Self Care Goal Status (U0454): At least 1 percent but less than 20 percent impaired, limited or restricted Self Care Discharge Status [phone]): 0 percent impaired, limited or restricted   Alm Bustard 12/01/2012, 9:27 AM

## 2012-12-01 NOTE — Progress Notes (Signed)
ANTICOAGULATION CONSULT NOTE - Follow Up  Pharmacy Consult for Warfarin Indication: pulmonary embolus and DVT  No Known Allergies  Patient Measurements: Height: 5\' 9"  (175.3 cm) Weight: 217 lb 6 oz (98.6 kg) IBW/kg (Calculated) : 70.7   Labs:  Recent Labs  11/29/12 1355 11/29/12 1715 11/29/12 2252 11/30/12 0500 12/01/12 0419  HGB 10.9*  --   --  10.3* 9.5*  HCT 31.6*  --   --  29.6* 27.3*  PLT 242  --   --  205 215  LABPROT 25.2*  --   --  24.5* 24.4*  INR 2.42*  --   --  2.33* 2.32*  CREATININE 1.40*  --   --  1.47* 1.99*  TROPONINI <0.30 <0.30 <0.30  --   --     Estimated Creatinine Clearance: 42.9 ml/min (by C-G formula based on Cr of 1.99).   Assessment: 72 YOM admitted with LE edema with recent dx of PE and DVT 11/17/12. Home warfarin dose is stated as 5mg , last taken 3/28. INR at admission was therapeutic at 2.42.  INR remains therapetic today (2.32) but trending down   Patient has received 2 doses of warfarin 2.5mg  so far  During previous hospitalization, INR went up to 3.6 after 4 doses of warfarin 5mg   H/H trending down, no bleeding reported in chart - management per MD  Per wife patient drinking EtOH and noncompliant with medications  Will continue to use lower-than-normal home dose due to likely noncompliance as outpatient and per previous hospitalization INR trend   Goal of Therapy:  INR 2-3 Monitor platelets by anticoagulation protocol: Yes   Plan:   Warfarin 3 mg PO x1 at 18:00  Daily INR  Monitor for bleeding  Consider evaluating need for ASA since on wafarin  Darrol Angel, PharmD Pager: [phone] 7:12 AM 12/01/2012

## 2012-12-01 NOTE — Care Management Note (Addendum)
    Page 1 of 2   12/02/2012     12:57:20 PM   CARE MANAGEMENT NOTE 12/02/2012  Patient:  Devin Williams, Devin Williams   Account Number:  [id number]  Date Initiated:  12/01/2012  Documentation initiated by:  Colleen Can  Subjective/Objective Assessment:   DX BILATERAL PAIN AND SWELLING, UNABLE TO WALK     Action/Plan:   CM SPOKE WITH PATIENT.PT IS FROM HOME IN [place]. LlIVES WITH SPOUSE WHO WORKS PART-TIME. dAUGHTER AND 2 GRANDCHILDREN ALSO LIVE WITH PATIENT.   Anticipated DC Date:  12/02/2012   Anticipated DC Plan:  HOME W HOME HEALTH SERVICES      DC Planning Services  CM consult      Choice offered to / List presented to:  C-1 Patient   DME arranged  Levan Hurst      DME agency  [facility]     [facility] arranged  HH-1 RN  HH-2 PT  HH-3 OT  HH-6 SOCIAL WORKER      HH agency  [facility]   Status of service:  Completed, signed off Medicare Important Message given?   (If response is "NO", the following Medicare IM given date fields will be blank) Date Medicare IM given:   Date Additional Medicare IM given:    Discharge Disposition:  HOME W HOME HEALTH SERVICES  Per UR Regulation:  Reviewed for med. necessity/level of care/duration of stay  If discussed at Long Length of Stay Meetings, dates discussed:    Comments:  12/02/12 Cory Rama RN,BSN NCM [phone] SPOKE TO PATIENT WHO AGREED FOR ME TO TALK TO HIS SPOUSE.TC GAIL Malcolm C#[phone] ABOUT D/C PLANS.INFORMED HER OF PT/OT-RECOMMENDATIONS OF HH,RW,ALSO EXPLAINED THAT EVEN THOUGH THE PRIOR ADMISSION PATIENT REFUSED SNF,THIS ADMISSION HIS IS NOT RECOMMENDED FOR SNF,& THEREFORE WOULD NOT QUALIFY FOR SNF,SHE WOULD HAVE TO PRIVATE PAY IF NEEDED.PATIENT STATES"I DO NOT WANT TO GO TO SNF",INFORMED SPOUSE THAT SINCE PATIENT ABLE TO MAKE OWN DECISONS, & SNF IS NOT RECOMMENDED,THE PATIENT CAN SAFELY D/C HOME W/HH.SHE COMPREHENDED.[facility] CHOSEN FOR HH,SPOKE TO KRISTEN [facility] REP-INFORMED OF HH ORDERS,& D/C.RW  ALREADY DELIVERED TO RM.SPOUSE STATES SHE IS ABLE TO TRANSP HOME @ D/C.ALSO PROVIDED W/PRIVATE DUTY CARE AGENCY LIST.MD UPDATED.  12/01/2012 LINDA MANNING BSN RN CCM PT STATES FAMILY MEMBERS WILL ASSIST WITH HIS CARE AT HOME BUT NOT SURE WHO WILL BE THERE AT PARTICULAR TIMES. PT WAS SUPPOSE TO GO TO snf AT RECENT HOSPITAL STAY BUT DECIDED TO GO  AND WENT HOME. WAS READMITTED TO HOSPITAL 5 DAYS LATER. ? SNF /WILL NEED TO REFER TO CSW AND TALK WITH CAREGIVER. CATREGIVER IS NOT CURRENTLY HERE. CM TO F/U.

## 2012-12-02 LAB — BASIC METABOLIC PANEL
BUN: 10 mg/dL (ref 6–23)
CO2: 24 mEq/L (ref 19–32)
Calcium: 8.4 mg/dL (ref 8.4–10.5)
Creatinine, Ser: 2.01 mg/dL — ABNORMAL HIGH (ref 0.50–1.35)
Glucose, Bld: 105 mg/dL — ABNORMAL HIGH (ref 70–99)

## 2012-12-02 LAB — CBC
MCH: 30.4 pg (ref 26.0–34.0)
MCV: 89.2 fL (ref 78.0–100.0)
Platelets: 209 10*3/uL (ref 150–400)
RDW: 14.5 % (ref 11.5–15.5)

## 2012-12-02 LAB — URINE DRUGS OF ABUSE SCREEN W ALC, ROUTINE (REF LAB)
Amphetamine Screen, Ur: NEGATIVE
Barbiturate Quant, Ur: NEGATIVE
Cocaine Metabolites: NEGATIVE
Creatinine,U: 209.4 mg/dL
Methadone: NEGATIVE
Propoxyphene: NEGATIVE

## 2012-12-02 MED ORDER — PREDNISONE 20 MG PO TABS: 40.0000 mg | ORAL_TABLET | Freq: Every day | ORAL | Status: DC

## 2012-12-02 MED ORDER — FOLIC ACID 1 MG PO TABS: 1.0000 mg | ORAL_TABLET | Freq: Every day | ORAL | Status: DC

## 2012-12-02 MED ORDER — WARFARIN SODIUM 5 MG PO TABS
5.0000 mg | ORAL_TABLET | Freq: Once | ORAL | Status: DC
  Filled 2012-12-02: qty 1

## 2012-12-02 MED ORDER — THIAMINE HCL 100 MG PO TABS: 100.0000 mg | ORAL_TABLET | Freq: Every day | ORAL | Status: DC

## 2012-12-02 MED ORDER — TRIAMCINOLONE 0.1 % CREAM:EUCERIN CREAM 1:1: 1.0000 "application " | TOPICAL_CREAM | Freq: Three times a day (TID) | CUTANEOUS | Status: DC

## 2012-12-02 MED ORDER — ENSURE COMPLETE PO LIQD: 237.0000 mL | Freq: Two times a day (BID) | ORAL | Status: DC

## 2012-12-02 MED ORDER — NITROGLYCERIN 0.4 MG SL SUBL: 0.4000 mg | SUBLINGUAL_TABLET | SUBLINGUAL | Status: DC | PRN

## 2012-12-02 MED ORDER — OXYCODONE HCL 5 MG PO TABS: 5.0000 mg | ORAL_TABLET | ORAL | Status: DC | PRN

## 2012-12-02 MED ORDER — CARVEDILOL 25 MG PO TABS: 25.0000 mg | ORAL_TABLET | Freq: Two times a day (BID) | ORAL | Status: DC

## 2012-12-02 MED ORDER — WARFARIN SODIUM 3 MG PO TABS: 3.0000 mg | ORAL_TABLET | Freq: Every day | ORAL | Status: DC

## 2012-12-02 NOTE — Progress Notes (Signed)
Assumed care of this patient at 12:30a , agree with previous RN assessment. Shontell Prosser M. Monserrat Vidaurri, RN  

## 2012-12-02 NOTE — Progress Notes (Signed)
Spoke to the patient at length regarding DC medications and importance of getting and filling pillbox, he verbalized understanding. Pt's wife stated she would  pick him up at the from door, therefor writer took pt down in wheelchair and reviewed pt's current medication, and need for pillbox; she verbalized understanding.

## 2012-12-02 NOTE — Progress Notes (Signed)
ANTICOAGULATION CONSULT NOTE - Follow Up  Pharmacy Consult for Warfarin Indication: pulmonary embolus and DVT  No Known Allergies  Patient Measurements: Height: 5\' 9"  (175.3 cm) Weight: 216 lb 4.3 oz (98.1 kg) IBW/kg (Calculated) : 70.7   Labs:  Recent Labs  11/29/12 2252 11/30/12 0500 12/01/12 0419 12/01/12 0700 12/01/12 1300 12/02/12 0439  HGB  --  10.3* 9.5*  --   --  9.3*  HCT  --  29.6* 27.3*  --   --  27.3*  PLT  --  205 215  --   --  209  LABPROT  --  24.5* 24.4*  --   --  21.7*  INR  --  2.33* 2.32*  --   --  1.98*  CREATININE  --  1.47* 1.99*  --   --  2.01*  TROPONINI <0.30  --   --  <0.30 <0.30  --     Estimated Creatinine Clearance: 42.3 ml/min (by C-G formula based on Cr of 2.01).   Assessment: 67 YO M admitted with LE edema with recent dx of PE and DVT on 11/17/12. Home warfarin dose stated as 5mg , last taken 3/28. INR at admission was therapeutic at 2.42.  INR just under goal range today (1.98)   Patient has received 2 doses of warfarin 2.5mg  and 1 dose of 3mg  so far  During previous hospitalization, INR went up to 3.6 after 4 doses of warfarin 5mg   H/H trending down, no bleeding reported in chart - management per MD  Per wife patient drinking EtOH and noncompliant with medications  Will use larger warfarin dose tonight   Goal of Therapy:  INR 2-3 Monitor platelets by anticoagulation protocol: Yes   Plan:   Warfarin 5 mg PO x1 at 18:00  Daily INR  Monitor for bleeding  Darrol Angel, PharmD Pager: [phone] 8:22 AM 12/02/2012

## 2012-12-02 NOTE — Discharge Summary (Signed)
Physician Discharge Summary  Devin Williams:[id number] DOB: 1946/02/26 DOA: 11/29/2012  PCP: Devin Courier, MD  Admit date: 11/29/2012 Discharge date: 12/02/2012  Time spent: 65 minutes  Recommendations for Outpatient Follow-up:  1. Followup with HILL,[name], MD in 1 week. All followup patient may benefit from a referral to a nephrologist for further management of his chronic kidney disease stage III. Patient will also need a basic metabolic profile done to followup on his electrolytes and renal function. Patient may also benefit from a malignancy workup due to his extensive clot burden and low albumin levels. 2. Patient is to followup at PCPs office for PT/INR check on Friday for 4/4/ 2014. Patient has been discharged on Coumadin 3 mg daily. Patient will also need a basic metabolic profile done at that visit to followup on his electrolytes and renal function.  Discharge Diagnoses:  Principal Problem:   Nonspecific abnormal electrocardiogram (ECG) (EKG) Active Problems:   DVT (deep venous thrombosis)   CAD (coronary artery disease)   Alcoholism   HTN (hypertension)   Hyperlipidemia   Acute on chronic renal failure   Lower extremity pain   Normocytic anemia   Sinus tachycardia   Muscular deconditioning   Gout flare   Discharge Condition: Stable and improved  Diet recommendation: Low sodium diet  Filed Weights   11/30/12 0616 12/01/12 0610 12/02/12 0444  Weight: 96.208 kg (212 lb 1.6 oz) 98.6 kg (217 lb 6 oz) 98.1 kg (216 lb 4.3 oz)    History of present illness:  The patient is a 67 y.o. year-old male with history of DVTs, EtOH abuse, chronic diastolic and systolic heart failure, EtOH abuse and dependence, CAD, HTN, Thrombocytopenia CKD who presents with lower extremity DVTs. The patient was last at their baseline health several weeks ago when he developed SOB with exertion. He was hospitalized from 3/17 to 3/24 where he was found to have bilateral acute DVTs and negative  VQ scan. He was started on warfarin. Etiology of the DVTs has not been found. He was supposed to be discharged to SNF, but he refused at the last minute and went home. After returning home, he developed swelling of his ankles 2-3 hours after he was discharged and they have gotten progressively bigger. Both forefeet have become tender and painful to walk on. He almost needed to be carried to the care today that he needed assistance to get to the car to come to the hospital. He denies orthopnea, PND. He gets SOB when going to the bathroom about 30-ft, stable from when he was discharged. In the ER, he was found to have lower extremity edema, but a therapeutic INR and pro-BNP lower than his previous admission. No pulmonary edema on CXR. The ER ordered lower extremity duplex to determine if he has had extension of clot which is pending.  He also admits that he started drinking again two days ago and has been noncompliant with his medications since that time.  He was tachycardic in the ER with some ST segment depressions in the anterolateral leads which are new compared to ECG from a few weeks ago. Denies CP, palpitations, and first troponin negative.  Patient being admitted for abnormal ECG, pain control, further work up of lower extremity swelling and possible SNF placement.      Hospital Course:  #1. Abnl ECG:  On admission patient was noted to have some EKG changes with some ST segment depressions in the and serial lateral leads. It was felt the differential included  ACS versus repolarization abnormality secondary to tachycardia. Cardiac enzymes were cycled which were negative x3. Patient denied any chest pain. She denied any shortness of breath. Patient remained asymptomatic throughout the hospitalization. Pro BNP which was obtained was lower than prior. Patient was maintained on his home regimen of aspirin, beta blocker, statin and nitroglycerin as needed. Patient did have a 2-D echo from 11/18/2012 which  have the EF of 45-50% with hypokinesis of the anteroseptal and anterior myocardium. Right ventricular cavity size was moderately dilated. Patient remained in stable condition and will followup with PCP as outpatient. #2.Sinus tachycardia:  On admission patient was noted to have a sinus tachycardia which was similar to his recent admission at which point in time a VQ scan which was done was negative. Patient TSH was 0.331. UDS was negative. Patient had no signs of alcohol withdrawal. Patient was placed back on beta blocker with improvement in his heart rate and resolution of the tachycardia. Patient will followup with PCP as outpatient. #3. Lower extremity edema:  On admission patient was noted to have lower extremity edema, which was felt to be likely secondary from his recently diagnosed bilateral DVTs. Patient was not short of breath and did not have any symptoms to suggest acute heart failure. Patient's Norvasc was discontinued. Patient was maintained on Coumadin for his DVTs. Chest x-ray which was done was negative. Patient was noted not to have any proteinuria in his urinalysis and his albumin was noted to be low at 2.2. Patient improved clinically  and will be discharged home in stable condition. #4. Gout   on admission patient was noted to have complaints of bilateral foot pain leading to inability to ambulate. Patient was noted to have very tender MTP joints. Uric acid  level was obtained and elevated. patient was placed on steroids. Colchicine and NSAIDs were avoided secondary to history of chronic kidney disease. Patient improved clinically on steroids bilateral feet pain improved significantly and patient was able to ambulate with the aid of a role a walker. Patient be discharged home on a few days of oral steroids and is to followup with PCP as outpatient. Once patient is over his acute flare may benefit from allopurinol.  #5. CAD/HTN/HLD, chronic systolic and diastolic heart failure  Remained  stable throughout the hospitalization. Cardiac enzymes which was cycled were negative x3. Patient was maintained on his home regimen of aspirin, statin. Patient's metoprolol was changed to Coreg for better blood pressure control. Patient's lisinopril was discontinued during the hospitalization secondary to borderline blood pressure issues. Patient will be discharged home on Coreg. His Norvasc has been discontinued secondary to lower extremity edema and his lisinopril has been discontinued secondary to borderline blood pressure issues. Patient will need a repeat a basic metabolic profile done to followup on his electrolytes and renal function. Patient will followup with PCP as outpatient.  #6. Bilateral DVTs, INR at goal  On admission patient was noted to have INR of 2.42 which was within goal. Patient was maintained on Coumadin per pharmacy. Patient be discharged home on Coumadin 3 mg daily. Patient will need a PT/INR checked on Friday, 12/05/2012. Patient will followup with his PCP as outpatient. Patient will likely benefit from a malignancy evaluation per PCP in the setting of his extensive clot and low albumin levels. #7. EtOH abuse  Remained stable during the hospitalization. Patient was maintained on the Ativan withdrawal protocol. He was also placed on folic acid and thiamine which he will be discharged on.  The patient  was counseled on the detrimental effects of alcohol abuse. #8. CKD stage 3, Patient was noted to have a history of chronic kidney disease stage III. On admission patient's renal function was better than his baseline which is approximately 2.5. Patient's lisinopril was discontinued secondary to borderline blood pressure. Patient was maintained on beta blocker. Patient will need a basic metabolic profile drawn to followup on his renal function 12/05/2012. Patient will followup with PCP as outpatient. Patient may benefit from a referral to nephrology as outpatient.   The rest of patient's  chronic medical issues remained stable throughout the hospitalization and patient be discharged in stable and improved condition.      Procedures: LE dopplers 11/29/12  Plain films of bilateral feet 11/29/12   Consultations:  None  Discharge Exam: Filed Vitals:   12/01/12 1516 12/01/12 1800 12/01/12 2119 12/02/12 0444  BP: 97/64 103/59 107/55 108/55  Pulse: 68 69 58 77  Temp: 98.3 F (36.8 C)  98.2 F (36.8 C) 98.3 F (36.8 C)  TempSrc: Oral  Oral Oral  Resp: 20  18 18   Height:      Weight:    98.1 kg (216 lb 4.3 oz)  SpO2: 96%  100% 100%    General: NAD Cardiovascular: RRR Respiratory: CTAB  Discharge Instructions  Discharge Orders   Future Orders Complete By Expires     Diet - low sodium heart healthy  As directed     Discharge instructions  As directed     Comments:      Follow up with HILL,[name], MD in 1 week. Follow up at [facility], MD office on Friday 12/05/12 for coumadin check and BMET    Increase activity slowly  As directed         Medication List    STOP taking these medications       amLODipine 10 MG tablet  Commonly known as:  NORVASC     lisinopril 20 MG tablet  Commonly known as:  PRINIVIL,ZESTRIL     metoprolol 50 MG tablet  Commonly known as:  LOPRESSOR      TAKE these medications       aspirin EC 81 MG tablet  Take 81 mg by mouth daily.     atorvastatin 20 MG tablet  Commonly known as:  LIPITOR  Take 20 mg by mouth at bedtime.     carvedilol 25 MG tablet  Commonly known as:  COREG  Take 1 tablet (25 mg total) by mouth 2 (two) times daily with a meal.     feeding supplement Liqd  Take 237 mLs by mouth 2 (two) times daily between meals.     fenofibrate 160 MG tablet  Take 160 mg by mouth daily.     Fenofibric Acid 135 MG Cpdr  Take 1 capsule by mouth daily.     folic acid 1 MG tablet  Commonly known as:  FOLVITE  Take 1 tablet (1 mg total) by mouth daily.     hydrocortisone cream 0.5 %  Apply 1 application  topically 2 (two) times daily.     hydrOXYzine 10 MG tablet  Commonly known as:  ATARAX/VISTARIL  Take 10 mg by mouth 3 (three) times daily as needed for itching.     multivitamin with minerals Tabs  Take 1 tablet by mouth daily.     nitroGLYCERIN 0.4 MG SL tablet  Commonly known as:  NITROSTAT  Place 1 tablet (0.4 mg total) under the tongue every 5 (five) minutes  as needed for chest pain (hold for SBP < 110).     oxyCODONE 5 MG immediate release tablet  Commonly known as:  Oxy IR/ROXICODONE  Take 1 tablet (5 mg total) by mouth every 4 (four) hours as needed.     potassium chloride 10 MEQ tablet  Commonly known as:  K-DUR  Take 10 mEq by mouth daily.     predniSONE 20 MG tablet  Commonly known as:  DELTASONE  Take 2 tablets (40 mg total) by mouth daily with breakfast. Take for 4 days then stop.     thiamine 100 MG tablet  Take 1 tablet (100 mg total) by mouth daily.     triamcinolone 0.1 % cream : eucerin Crea  Apply 1 application topically 3 (three) times daily.     warfarin 3 MG tablet  Commonly known as:  COUMADIN  Take 1 tablet (3 mg total) by mouth daily. Pt takes at 5pm daily           Follow-up Information   Follow up with [facility], MD. Schedule an appointment as soon as possible for a visit on 12/09/2012.   Contact information:   [address] [place] [address] [phone]       Follow up with Devin Courier, MD On 12/05/2012. (check PT/INR coumadin and BMET on Fri 12/05/12)    Contact information:   [address] [place] [address] [phone]        The results of significant diagnostics from this hospitalization (including imaging, microbiology, ancillary and laboratory) are listed below for reference.    Significant Diagnostic Studies: Dg Chest 2 View  11/29/2012  *RADIOLOGY REPORT*  Clinical Data: Shortness of breath  CHEST - 2 VIEW  Comparison: 11/17/2012  Findings: Previous coronary bypass changes noted.   Remote bilateral healed rib fractures.  Stable heart size and vascularity.  No focal pneumonia, edema, effusion or pneumothorax.  No significant interval change.  IMPRESSION: Stable chest exam.  No acute process.   Original Report Authenticated By: Judie Petit. Miles Costain, [name]    Dg Chest 2 View  11/17/2012  *RADIOLOGY REPORT*  Clinical Data: Cough  CHEST - 2 VIEW  Comparison: 07/03/2009  Findings: Postop CABG.  Negative for heart failure.  Lungs are clear without infiltrate or effusion.  Multiple healed chronic rib fractures.  IMPRESSION: No acute cardiopulmonary abnormality.   Original Report Authenticated By: [name], [name]    US Abdomen Complete  11/19/2012  *RADIOLOGY REPORT*  Clinical Data:  Acute renal failure.  All views.  COMPLETE ABDOMINAL ULTRASOUND  Comparison:  Renal ultrasound dated 11/14/2004  Findings:  Gallbladder:  Multiple gallstones.  No gallbladder wall thickening. Negative sonographic Murphy's sign.  Common bile duct:  Limited visualization of the common bile duct. The visualized segment is 6.1 mm in diameter.  Liver:  The liver is diffusely echogenic.  The left lobe is not well seen.  No mass lesions.  IVC:  Not visualized.  Pancreas:  Not visualized.  Spleen:  Normal.  6.5 cm.  Right Kidney:  Normal.  11.2 cm in length.  Left Kidney:  Normal.  10.8 cm in length.  Abdominal aorta:  A small segment was visualized in the mid abdomen and measures 2.2 cm in diameter.  IMPRESSION: Cholelithiasis.  Hepatic steatosis.  IVC and pancreas are not visible.  The kidneys appear normal and unchanged since the prior exam.   Original Report Authenticated By: [name], [name]    Nm Pulmonary  Perf And Vent  11/18/2012  *RADIOLOGY REPORT*  Clinical Data: Shortness of breath  NM PULMONARY VENTILATION AND PERFUSION SCAN  Views:  Anterior, posterior, right lateral, left lateral, RAO, LAO, RPO, LPO - ventilation and perfusion  Radiopharmaceutical: Technetium 67m DTPA - ventilation; technetium 58m macroaggregated  albumin - perfusion  Dose:  41.0 mCi - ventilation; 5.5 mCi - perfusion  Route of administration:  Inhalation - ventilation; intravenous - perfusion  Comparison: Chest radiograph November 17, 2012  Findings:  The ventilation study shows homogeneous and symmetric uptake of radiotracer bilaterally.  On the perfusion study, there are no segmental perfusion defects. There is photopenia in the major fissures bilaterally.  This is not a finding that is typical for pulmonary embolus, and is more suggestive of fluid in these areas.  IMPRESSION:  Question fluid of both major fissures, a finding not seen on recent chest radiograph.  There are no appreciable segmental ventilation / perfusion mismatches on this study.  This study is felt to constitute an overall low probability of pulmonary embolus.   Original Report Authenticated By: Bretta Bang, [name]    Dg Foot 2 Views Left  11/29/2012  *RADIOLOGY REPORT*  Clinical Data: Left foot pain in the region of the metatarsal phalangeal joints.  No known injury.  History of psoriasis.  LEFT FOOT - 2 VIEW  Comparison: None.  Findings: Patchy osteopenia involving all of the bones of the foot. Diffuse soft tissue swelling, most pronounced dorsally.  No soft tissue gas, bone erosions or periosteal reaction.  Mild to moderate inferior and minimal posterior calcaneal spur formation.  IMPRESSION: Diffuse soft tissue swelling and patchy osteopenia.   Original Report Authenticated By: Beckie Salts, [name]    Dg Foot 2 Views Right  11/29/2012  *RADIOLOGY REPORT*  Clinical Data: Right foot pain in the region of the metatarsal phalangeal joints.  No known injury.  Psoriasis.  RIGHT FOOT - 2 VIEW  Comparison: None.  Findings: Two views of the right foot demonstrate patchy osteopenia in all of the bones of the foot.  There is also diffuse soft tissue swelling dorsally, medially, laterally and in the plantar aspect of the foot distally.  No soft tissue gas, bone erosions or periosteal reaction.   Mild to moderate inferior calcaneal spur formation.  IMPRESSION: Diffuse soft tissue swelling and patchy osteopenia, as described above.   Original Report Authenticated By: Beckie Salts, [name]     Microbiology: Recent Results (from the past 240 hour(s))  URINE CULTURE     Status: None   Collection Time    11/29/12  5:05 PM      Result Value Range Status   Specimen Description URINE, RANDOM   Final   Special Requests NONE   Final   Culture  Setup Time 11/30/2012 16:05   Final   Colony Count 15,000 COLONIES/ML   Final   Culture     Final   Value: Multiple bacterial morphotypes present, none predominant. Suggest appropriate recollection if clinically indicated.   Report Status 12/01/2012 FINAL   Final     Labs: Basic Metabolic Panel:  Recent Labs Lab 11/29/12 1355 11/30/12 0500 12/01/12 0419 12/02/12 0439  NA 139 136 138 134*  K 4.6 4.7 4.4 4.6  CL 105 105 106 102  CO2 21 22 24 24   GLUCOSE 91 138* 77 105*  BUN 6 6 8 10   CREATININE 1.40* 1.47* 1.99* 2.01*  CALCIUM 8.2* 8.1* 8.0* 8.4   Liver Function Tests:  Recent Labs Lab 11/29/12 1715  AST 65*  ALT 27  ALKPHOS 208*  BILITOT 1.1  PROT 5.6*  ALBUMIN 2.2*   No results found for this basename: LIPASE, AMYLASE,  in the last 168 hours No results found for this basename: AMMONIA,  in the last 168 hours CBC:  Recent Labs Lab 11/29/12 1355 11/30/12 0500 12/01/12 0419 12/02/12 0439  WBC 6.8 3.0* 6.7 5.9  NEUTROABS 3.8  --   --   --   HGB 10.9* 10.3* 9.5* 9.3*  HCT 31.6* 29.6* 27.3* 27.3*  MCV 88.3 89.4 89.2 89.2  PLT 242 205 215 209   Cardiac Enzymes:  Recent Labs Lab 11/29/12 1355 11/29/12 1715 11/29/12 2252 12/01/12 0700 12/01/12 1300  TROPONINI <0.30 <0.30 <0.30 <0.30 <0.30   BNP: BNP (last 3 results)  Recent Labs  11/17/12 1545 11/29/12 1355  PROBNP 3909.0* 1623.0*   CBG: No results found for this basename: GLUCAP,  in the last 168 hours     Signed:  THOMPSON,DANIEL  [facility] 12/02/2012, 1:18 PM

## 2013-01-15 ENCOUNTER — Encounter: Payer: Self-pay | Admitting: Pharmacist Clinician (PhC)/ Clinical Pharmacy Specialist

## 2013-01-15 ENCOUNTER — Encounter: Payer: Self-pay | Admitting: Cardiology

## 2013-01-15 ENCOUNTER — Ambulatory Visit: Payer: Medicare Other | Admitting: Cardiology

## 2013-01-16 ENCOUNTER — Encounter (HOSPITAL_COMMUNITY): Payer: Self-pay

## 2013-01-16 ENCOUNTER — Telehealth: Payer: Self-pay | Admitting: *Deleted

## 2013-01-16 ENCOUNTER — Emergency Department (HOSPITAL_COMMUNITY): Payer: Medicare Other

## 2013-01-16 ENCOUNTER — Inpatient Hospital Stay (HOSPITAL_COMMUNITY)
Admission: EM | Admit: 2013-01-16 | Discharge: 2013-02-01 | DRG: 441 | Disposition: A | Discharge status: Home / Self Care | Payer: Medicare Other | Attending: Cardiology | Admitting: Cardiology

## 2013-01-16 DIAGNOSIS — K633 Ulcer of intestine: Secondary | ICD-10-CM

## 2013-01-16 DIAGNOSIS — D378 Neoplasm of uncertain behavior of other specified digestive organs: Secondary | ICD-10-CM | POA: Diagnosis present

## 2013-01-16 DIAGNOSIS — E46 Unspecified protein-calorie malnutrition: Secondary | ICD-10-CM | POA: Diagnosis present

## 2013-01-16 DIAGNOSIS — N183 Chronic kidney disease, stage 3 unspecified: Secondary | ICD-10-CM | POA: Diagnosis present

## 2013-01-16 DIAGNOSIS — Z833 Family history of diabetes mellitus: Secondary | ICD-10-CM

## 2013-01-16 DIAGNOSIS — I959 Hypotension, unspecified: Secondary | ICD-10-CM | POA: Diagnosis not present

## 2013-01-16 DIAGNOSIS — K7689 Other specified diseases of liver: Secondary | ICD-10-CM | POA: Diagnosis present

## 2013-01-16 DIAGNOSIS — I129 Hypertensive chronic kidney disease with stage 1 through stage 4 chronic kidney disease, or unspecified chronic kidney disease: Secondary | ICD-10-CM | POA: Diagnosis present

## 2013-01-16 DIAGNOSIS — E871 Hypo-osmolality and hyponatremia: Secondary | ICD-10-CM | POA: Diagnosis present

## 2013-01-16 DIAGNOSIS — N170 Acute kidney failure with tubular necrosis: Secondary | ICD-10-CM | POA: Diagnosis present

## 2013-01-16 DIAGNOSIS — I2582 Chronic total occlusion of coronary artery: Secondary | ICD-10-CM | POA: Diagnosis present

## 2013-01-16 DIAGNOSIS — K922 Gastrointestinal hemorrhage, unspecified: Secondary | ICD-10-CM | POA: Diagnosis not present

## 2013-01-16 DIAGNOSIS — I5043 Acute on chronic combined systolic (congestive) and diastolic (congestive) heart failure: Secondary | ICD-10-CM

## 2013-01-16 DIAGNOSIS — Z87891 Personal history of nicotine dependence: Secondary | ICD-10-CM

## 2013-01-16 DIAGNOSIS — I5042 Chronic combined systolic (congestive) and diastolic (congestive) heart failure: Secondary | ICD-10-CM | POA: Diagnosis not present

## 2013-01-16 DIAGNOSIS — I82409 Acute embolism and thrombosis of unspecified deep veins of unspecified lower extremity: Secondary | ICD-10-CM | POA: Diagnosis present

## 2013-01-16 DIAGNOSIS — E785 Hyperlipidemia, unspecified: Secondary | ICD-10-CM

## 2013-01-16 DIAGNOSIS — D509 Iron deficiency anemia, unspecified: Secondary | ICD-10-CM | POA: Diagnosis not present

## 2013-01-16 DIAGNOSIS — I2510 Atherosclerotic heart disease of native coronary artery without angina pectoris: Secondary | ICD-10-CM

## 2013-01-16 DIAGNOSIS — I5023 Acute on chronic systolic (congestive) heart failure: Secondary | ICD-10-CM

## 2013-01-16 DIAGNOSIS — K219 Gastro-esophageal reflux disease without esophagitis: Secondary | ICD-10-CM | POA: Diagnosis present

## 2013-01-16 DIAGNOSIS — K7200 Acute and subacute hepatic failure without coma: Principal | ICD-10-CM | POA: Diagnosis present

## 2013-01-16 DIAGNOSIS — I509 Heart failure, unspecified: Secondary | ICD-10-CM

## 2013-01-16 DIAGNOSIS — E875 Hyperkalemia: Secondary | ICD-10-CM

## 2013-01-16 DIAGNOSIS — D371 Neoplasm of uncertain behavior of stomach: Secondary | ICD-10-CM | POA: Diagnosis present

## 2013-01-16 DIAGNOSIS — I10 Essential (primary) hypertension: Secondary | ICD-10-CM

## 2013-01-16 DIAGNOSIS — I428 Other cardiomyopathies: Secondary | ICD-10-CM | POA: Diagnosis present

## 2013-01-16 DIAGNOSIS — R7401 Elevation of levels of liver transaminase levels: Secondary | ICD-10-CM

## 2013-01-16 DIAGNOSIS — I82403 Acute embolism and thrombosis of unspecified deep veins of lower extremity, bilateral: Secondary | ICD-10-CM

## 2013-01-16 DIAGNOSIS — E878 Other disorders of electrolyte and fluid balance, not elsewhere classified: Secondary | ICD-10-CM | POA: Diagnosis present

## 2013-01-16 DIAGNOSIS — L738 Other specified follicular disorders: Secondary | ICD-10-CM | POA: Diagnosis present

## 2013-01-16 DIAGNOSIS — I255 Ischemic cardiomyopathy: Secondary | ICD-10-CM

## 2013-01-16 DIAGNOSIS — Z91199 Patient's noncompliance with other medical treatment and regimen due to unspecified reason: Secondary | ICD-10-CM

## 2013-01-16 DIAGNOSIS — D126 Benign neoplasm of colon, unspecified: Secondary | ICD-10-CM | POA: Diagnosis present

## 2013-01-16 DIAGNOSIS — Z8673 Personal history of transient ischemic attack (TIA), and cerebral infarction without residual deficits: Secondary | ICD-10-CM

## 2013-01-16 DIAGNOSIS — I96 Gangrene, not elsewhere classified: Secondary | ICD-10-CM

## 2013-01-16 DIAGNOSIS — Z880 Allergy status to penicillin: Secondary | ICD-10-CM

## 2013-01-16 DIAGNOSIS — F1020 Alcohol dependence, uncomplicated: Secondary | ICD-10-CM

## 2013-01-16 DIAGNOSIS — R600 Localized edema: Secondary | ICD-10-CM

## 2013-01-16 DIAGNOSIS — K449 Diaphragmatic hernia without obstruction or gangrene: Secondary | ICD-10-CM | POA: Diagnosis present

## 2013-01-16 DIAGNOSIS — Z86718 Personal history of other venous thrombosis and embolism: Secondary | ICD-10-CM

## 2013-01-16 DIAGNOSIS — D649 Anemia, unspecified: Secondary | ICD-10-CM

## 2013-01-16 DIAGNOSIS — R000 Tachycardia, unspecified: Secondary | ICD-10-CM

## 2013-01-16 DIAGNOSIS — M109 Gout, unspecified: Secondary | ICD-10-CM | POA: Diagnosis present

## 2013-01-16 DIAGNOSIS — Z9119 Patient's noncompliance with other medical treatment and regimen: Secondary | ICD-10-CM

## 2013-01-16 DIAGNOSIS — N189 Chronic kidney disease, unspecified: Secondary | ICD-10-CM | POA: Diagnosis present

## 2013-01-16 DIAGNOSIS — K2270 Barrett's esophagus without dysplasia: Secondary | ICD-10-CM | POA: Diagnosis present

## 2013-01-16 DIAGNOSIS — I2589 Other forms of chronic ischemic heart disease: Secondary | ICD-10-CM | POA: Diagnosis present

## 2013-01-16 DIAGNOSIS — L408 Other psoriasis: Secondary | ICD-10-CM | POA: Diagnosis present

## 2013-01-16 DIAGNOSIS — R7989 Other specified abnormal findings of blood chemistry: Secondary | ICD-10-CM | POA: Diagnosis present

## 2013-01-16 DIAGNOSIS — L409 Psoriasis, unspecified: Secondary | ICD-10-CM

## 2013-01-16 DIAGNOSIS — N179 Acute kidney failure, unspecified: Secondary | ICD-10-CM

## 2013-01-16 DIAGNOSIS — R609 Edema, unspecified: Secondary | ICD-10-CM

## 2013-01-16 DIAGNOSIS — N289 Disorder of kidney and ureter, unspecified: Secondary | ICD-10-CM

## 2013-01-16 DIAGNOSIS — I25810 Atherosclerosis of coronary artery bypass graft(s) without angina pectoris: Secondary | ICD-10-CM | POA: Diagnosis present

## 2013-01-16 DIAGNOSIS — Z7901 Long term (current) use of anticoagulants: Secondary | ICD-10-CM

## 2013-01-16 DIAGNOSIS — E8809 Other disorders of plasma-protein metabolism, not elsewhere classified: Secondary | ICD-10-CM

## 2013-01-16 HISTORY — DX: Barrett's esophagus without dysplasia: K22.70

## 2013-01-16 LAB — CBC WITH DIFFERENTIAL/PLATELET
Basophils Absolute: 0 10*3/uL (ref 0.0–0.1)
Eosinophils Relative: 9 % — ABNORMAL HIGH (ref 0–5)
Lymphocytes Relative: 11 % — ABNORMAL LOW (ref 12–46)
Lymphs Abs: 0.8 10*3/uL (ref 0.7–4.0)
Neutro Abs: 5.2 10*3/uL (ref 1.7–7.7)
Neutrophils Relative %: 71 % (ref 43–77)
Platelets: 177 10*3/uL (ref 150–400)
RBC: 3.47 MIL/uL — ABNORMAL LOW (ref 4.22–5.81)
RDW: 16.5 % — ABNORMAL HIGH (ref 11.5–15.5)
WBC: 7.3 10*3/uL (ref 4.0–10.5)

## 2013-01-16 LAB — BASIC METABOLIC PANEL
Calcium: 8.8 mg/dL (ref 8.4–10.5)
Creatinine, Ser: 1.38 mg/dL — ABNORMAL HIGH (ref 0.50–1.35)
GFR calc Af Amer: 60 mL/min — ABNORMAL LOW (ref >90)
Sodium: 132 mEq/L — ABNORMAL LOW (ref 135–145)

## 2013-01-16 LAB — PROTIME-INR
INR: 2.77 — ABNORMAL HIGH (ref 0.00–1.49)
Prothrombin Time: 27.9 seconds — ABNORMAL HIGH (ref 11.6–15.2)

## 2013-01-16 LAB — URINALYSIS, ROUTINE W REFLEX MICROSCOPIC
Glucose, UA: NEGATIVE mg/dL
Hgb urine dipstick: NEGATIVE
Ketones, ur: NEGATIVE mg/dL
Protein, ur: NEGATIVE mg/dL
Urobilinogen, UA: 1 mg/dL (ref 0.0–1.0)

## 2013-01-16 LAB — URINE MICROSCOPIC-ADD ON

## 2013-01-16 LAB — TROPONIN I: Troponin I: <0.3 ng/mL (ref <0.30)

## 2013-01-16 LAB — PRO B NATRIURETIC PEPTIDE: Pro B Natriuretic peptide (BNP): 829.9 pg/mL — ABNORMAL HIGH (ref 0–125)

## 2013-01-16 MED ORDER — POTASSIUM CHLORIDE ER 10 MEQ PO TBCR
10.0000 meq | EXTENDED_RELEASE_TABLET | Freq: Every day | ORAL | Status: DC
  Administered 2013-01-17: 10 meq via ORAL
  Filled 2013-01-16 (×2): qty 1

## 2013-01-16 MED ORDER — MORPHINE SULFATE 4 MG/ML IJ SOLN
4.0000 mg | INTRAMUSCULAR | Status: AC | PRN
  Administered 2013-01-16 (×2): 4 mg via INTRAVENOUS
  Filled 2013-01-16 (×2): qty 1

## 2013-01-16 MED ORDER — ISOSORBIDE MONONITRATE ER 60 MG PO TB24
90.0000 mg | ORAL_TABLET | Freq: Every day | ORAL | Status: DC
  Administered 2013-01-17 – 2013-02-01 (×16): 90 mg via ORAL
  Filled 2013-01-16 (×16): qty 1

## 2013-01-16 MED ORDER — CARVEDILOL 25 MG PO TABS
25.0000 mg | ORAL_TABLET | Freq: Two times a day (BID) | ORAL | Status: DC
  Administered 2013-01-17 – 2013-02-01 (×26): 25 mg via ORAL
  Filled 2013-01-16 (×33): qty 1

## 2013-01-16 MED ORDER — WARFARIN - PHYSICIAN DOSING INPATIENT: Freq: Every day | Status: DC

## 2013-01-16 MED ORDER — TRIAMCINOLONE 0.1 % CREAM:EUCERIN CREAM 1:1
1.0000 "application " | TOPICAL_CREAM | Freq: Three times a day (TID) | CUTANEOUS | Status: DC
  Administered 2013-01-17 – 2013-02-01 (×43): 1 via TOPICAL
  Filled 2013-01-16 (×7): qty 1

## 2013-01-16 MED ORDER — ACETAMINOPHEN 325 MG PO TABS: 650.0000 mg | ORAL_TABLET | Freq: Four times a day (QID) | ORAL | Status: DC | PRN

## 2013-01-16 MED ORDER — HYDROXYZINE HCL 10 MG PO TABS
10.0000 mg | ORAL_TABLET | Freq: Three times a day (TID) | ORAL | Status: DC | PRN
  Administered 2013-01-17 – 2013-02-01 (×29): 10 mg via ORAL
  Filled 2013-01-16 (×54): qty 1

## 2013-01-16 MED ORDER — ONDANSETRON HCL 4 MG PO TABS: 4.0000 mg | ORAL_TABLET | Freq: Four times a day (QID) | ORAL | Status: DC | PRN

## 2013-01-16 MED ORDER — FENOFIBRIC ACID 135 MG PO CPDR: 1.0000 | DELAYED_RELEASE_CAPSULE | Freq: Every day | ORAL | Status: DC

## 2013-01-16 MED ORDER — ADULT MULTIVITAMIN W/MINERALS CH
1.0000 | ORAL_TABLET | Freq: Every day | ORAL | Status: DC
  Administered 2013-01-17 – 2013-02-01 (×16): 1 via ORAL
  Filled 2013-01-16 (×16): qty 1

## 2013-01-16 MED ORDER — SODIUM CHLORIDE 0.9 % IJ SOLN
3.0000 mL | Freq: Two times a day (BID) | INTRAMUSCULAR | Status: DC
  Administered 2013-01-17 – 2013-01-31 (×31): 3 mL via INTRAVENOUS

## 2013-01-16 MED ORDER — SODIUM CHLORIDE 0.9 % IV SOLN
INTRAVENOUS | Status: DC
  Administered 2013-01-16: 13:00:00 via INTRAVENOUS

## 2013-01-16 MED ORDER — OXYCODONE HCL 5 MG PO TABS
5.0000 mg | ORAL_TABLET | ORAL | Status: DC | PRN
  Administered 2013-01-16 – 2013-01-25 (×18): 5 mg via ORAL
  Filled 2013-01-16 (×18): qty 1

## 2013-01-16 MED ORDER — DORZOLAMIDE HCL-TIMOLOL MAL 2-0.5 % OP SOLN
1.0000 [drp] | Freq: Every day | OPHTHALMIC | Status: DC
  Administered 2013-01-17 – 2013-01-31 (×16): 1 [drp] via OPHTHALMIC
  Filled 2013-01-16: qty 10

## 2013-01-16 MED ORDER — LATANOPROST 0.005 % OP SOLN
1.0000 [drp] | Freq: Every day | OPHTHALMIC | Status: DC
  Administered 2013-01-17 – 2013-01-31 (×16): 1 [drp] via OPHTHALMIC
  Filled 2013-01-16: qty 2.5

## 2013-01-16 MED ORDER — ACETAMINOPHEN 650 MG RE SUPP: 650.0000 mg | Freq: Four times a day (QID) | RECTAL | Status: DC | PRN

## 2013-01-16 MED ORDER — RANOLAZINE ER 500 MG PO TB12
500.0000 mg | ORAL_TABLET | Freq: Two times a day (BID) | ORAL | Status: DC
  Administered 2013-01-17 – 2013-01-18 (×4): 500 mg via ORAL
  Filled 2013-01-16 (×6): qty 1

## 2013-01-16 MED ORDER — HYDROCORTISONE 1 % EX CREA
1.0000 "application " | TOPICAL_CREAM | Freq: Two times a day (BID) | CUTANEOUS | Status: DC
  Administered 2013-01-17 – 2013-02-01 (×27): 1 via TOPICAL
  Filled 2013-01-16: qty 28
  Filled 2013-01-16: qty 28.35

## 2013-01-16 MED ORDER — ACITRETIN 25 MG PO CAPS: 25.0000 mg | ORAL_CAPSULE | Freq: Every day | ORAL | Status: DC

## 2013-01-16 MED ORDER — ASPIRIN EC 81 MG PO TBEC
81.0000 mg | DELAYED_RELEASE_TABLET | Freq: Every day | ORAL | Status: DC
  Administered 2013-01-17 – 2013-02-01 (×16): 81 mg via ORAL
  Filled 2013-01-16 (×16): qty 1

## 2013-01-16 MED ORDER — HYDRALAZINE HCL 25 MG PO TABS
37.5000 mg | ORAL_TABLET | Freq: Two times a day (BID) | ORAL | Status: DC
  Administered 2013-01-17: 37.5 mg via ORAL
  Filled 2013-01-16 (×3): qty 1.5

## 2013-01-16 MED ORDER — WARFARIN SODIUM 5 MG PO TABS
5.0000 mg | ORAL_TABLET | Freq: Every day | ORAL | Status: DC
  Filled 2013-01-16: qty 1

## 2013-01-16 MED ORDER — NITROGLYCERIN 0.4 MG SL SUBL
0.4000 mg | SUBLINGUAL_TABLET | SUBLINGUAL | Status: DC | PRN
  Administered 2013-01-19: 0.4 mg via SUBLINGUAL
  Filled 2013-01-16 (×2): qty 25

## 2013-01-16 MED ORDER — ZOLPIDEM TARTRATE 5 MG PO TABS: 5.0000 mg | ORAL_TABLET | Freq: Every evening | ORAL | Status: DC | PRN

## 2013-01-16 MED ORDER — FUROSEMIDE 10 MG/ML IJ SOLN
40.0000 mg | Freq: Once | INTRAMUSCULAR | Status: AC
  Administered 2013-01-16: 40 mg via INTRAVENOUS
  Filled 2013-01-16: qty 4

## 2013-01-16 MED ORDER — ONDANSETRON HCL 4 MG/2ML IJ SOLN: 4.0000 mg | Freq: Four times a day (QID) | INTRAMUSCULAR | Status: DC | PRN

## 2013-01-16 MED ORDER — FUROSEMIDE 10 MG/ML IJ SOLN
40.0000 mg | Freq: Once | INTRAMUSCULAR | Status: AC
  Administered 2013-01-17: 40 mg via INTRAVENOUS
  Filled 2013-01-16: qty 4

## 2013-01-16 MED ORDER — ATORVASTATIN CALCIUM 20 MG PO TABS
20.0000 mg | ORAL_TABLET | Freq: Every day | ORAL | Status: DC
  Administered 2013-01-17 (×2): 20 mg via ORAL
  Filled 2013-01-16 (×4): qty 1

## 2013-01-16 MED ORDER — FENOFIBRATE 160 MG PO TABS
160.0000 mg | ORAL_TABLET | Freq: Every day | ORAL | Status: DC
  Administered 2013-01-17 – 2013-01-18 (×2): 160 mg via ORAL
  Filled 2013-01-16 (×2): qty 1

## 2013-01-16 MED ORDER — CARVEDILOL 25 MG PO TABS
25.0000 mg | ORAL_TABLET | Freq: Two times a day (BID) | ORAL | Status: DC
  Administered 2013-01-16: 25 mg via ORAL
  Filled 2013-01-16 (×2): qty 1

## 2013-01-16 NOTE — ED Notes (Signed)
Spoke with [facility], Dr. Herbie Baltimore, who is to admit pt. MD is working on note/orders at this time. Pt/family informed

## 2013-01-16 NOTE — Consult Note (Signed)
Reason for Admission: Increased weight gain + increased LEE and LE pain. Referring Physician: [facility] ER Physician  HPI: the patient is a 67 y/o AAM, followed at [facility] by Dr. Herbie Baltimore, with past cardiac history as follows:  1. Coronary artery disease, CABG in January 1999: LIMA-LAD, SVG-OM1-OM2 sequential, SVG-ramus intermedius, SVG-RCA.  a. Most recent catheterization, June 2008: RCA proximally occluded, filled via collaterals from the left; distal left main 80% to 90% with the LAD mid occlusion at D1 bifurcation. Circumflex proximally occluded but the AV branch provided collaterals to the RCA. Distal circumflex perfused via SVG-OM1-OM2, gave retrograde flow to the OM3. Ramus intermedius occluded proximally, filled by collaterals from the circumflex. SVG-RCA and SVG-diagonal and SVG-ramus were all occluded. LIMA-LAD was patent. Therefore, only the sequential SVGs to OM1 and OM2 and the LIMA-LAD were patent.  b. Recent echo EF of 45% to 50%, which was down from before. There was moderate aortic sclerosis but no stenosis and the left atrium was noted to be dilated on that previous study. The right ventricle was dilated on the one in March.  c. Persantine Myoview, May 2012: Inferior attenuation, low risk and no ischemia, either due to infarction or scar but no ischemia noted.  2. Chronic stable angina had previously been well controlled, now not as well controlled.   He was recently admitted to the hospital on May 4. 2014 with shortness of breath and edema, and was found to have bilateral lower extremity DVTs, with a V/Q scan which did not show any evidence of PE and was thought to be low probability. He had right popliteal and posterior tibial thromboses that were thought to be subacute, as well as left posterior tibial thrombosis. He was placed on Coumadin for anticoagulation. He presented to the [facility] ER today with a complaint of increased weight gain as well as increased LEE and bilateral leg pain. He  also notes extremely painful feet and bleeding from both feet that started today. He notes ~8 lb weight gain in the past 2 days. He has had mild SOB. He notes chest pain intermittently for the past several weeks that is relieved with SL NTG. No chest pain currently. He states that he has had recent problems getting his medications and has had increased chest pain without his Ranexa. He also states that he was without most of his other meds for about 2 weeks, but currently has them now. Work up in the ED revealed mildly elevated BNP ~800. CXR shows no signs of pulmonary edema. EKG without acute changes. Troponin negative x 1. The ER physician did state that the patient became tachycardic with a HR in the 140s upon standing. It was sinus rhythm. He had associated SOB at the time, but denied any other symptoms.     Past Medical History  Diagnosis Date  . Hypertension   . DVT (deep venous thrombosis) 11/2012    bilateral  . Psoriasis   . Chronic kidney disease     CKD 3  . Coronary artery disease 01/16/2011    R/P MV - some attenuation artiface in inferior region, no ischemia or infarct/scar seen in remaining myocardium; notwithstanding pts hx of CAD and CABG findings are more consistent w. non-ischemic cardiomyopathy; balanced ischemia cannot be ruled out;  no significant change from previous study 11/2008  . Hyperlipidemia   . Alcohol abuse, daily use   . Diastolic dysfunction   . GERD (gastroesophageal reflux disease)   . Optic nerve ischemia   . Problems with  hearing   . Vision problems   . SOB (shortness of breath) 11/18/2012    Echo at [facility]   . Pain in limb 11/29/2012    LE venous duplex done at [facility]  . Carotid bruit 03/20/2007    Carotid doppler - R & L bulbs and proximal ICA -  irregular nonhemodynamically significant plaque of 0-49% diameter reduction  . Edema   . Hypoalbuminemia   . Sinus tachycardia   . Cardiac angina   . Hx-TIA (transient ischemic attack)   . Alcohol abuse     Past  Surgical History  Procedure Laterality Date  . Cardiac surgery    . Knee surgery    . Appendectomy    . 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 Cardiac catheterization  02/14/2007    saphenous veingraft to RCA, saphenous vein graft to D1 and saphenous vein graft to ramus are occluded; native RCA is occluded and collateralized from L system; L main is severly diffusely diffuse; LAD is occluded after origin of a small diagonal 1; circumflex is occluded after origin of small AV groove branch circumflex    Family History  Problem Relation Age of Onset  . Diabetes Mellitus II Father   . Psoriasis Father   . Scleroderma Mother   . Blindness Maternal Grandfather     Social History:  reports that he quit smoking about 12 years ago. His smoking use included Cigarettes. He smoked 0.50 packs per day. He has never used smokeless tobacco. He reports that he drinks about 24.0 ounces of alcohol per week. He reports that he does not use illicit drugs.  Allergies:  Allergies  Allergen Reactions  . Penicillins     Medications: Prior to Admission:  Prescriptions prior to admission  Medication Sig Dispense Refill  . acitretin (SORIATANE) 25 MG capsule Take 25 mg by mouth daily before breakfast.      . aspirin EC 81 MG tablet Take 81 mg by mouth daily.      [name] atorvastatin (LIPITOR) 20 MG tablet Take 20 mg by mouth at bedtime.      . carvedilol (COREG) 25 MG tablet Take 1 tablet (25 mg total) by mouth 2 (two) times daily with a meal.  62 tablet  0  . Choline Fenofibrate (FENOFIBRIC ACID) 135 MG CPDR Take 1 capsule by mouth daily.      . dorzolamide-timolol (COSOPT) 22.3-6.8 MG/ML ophthalmic solution Place 1 drop into both eyes at bedtime.      . fenofibrate 160 MG tablet Take 160 mg by mouth daily.      . furosemide (LASIX) 20 MG tablet Take 20 mg by mouth daily.      . hydrocortisone cream 0.5 % Apply 1 application topically 2 (two)  times daily.      . hydrOXYzine (ATARAX/VISTARIL) 10 MG tablet Take 10 mg by mouth 3 (three) times daily as needed for itching.      . isosorbide mononitrate (IMDUR) 60 MG 24 hr tablet Take 60 mg by mouth daily.      [name] latanoprost (XALATAN) 0.005 % ophthalmic solution 1 drop at bedtime.      . Multiple Vitamin (MULTIVITAMIN WITH MINERALS) TABS Take 1 tablet by mouth daily.      . nitroGLYCERIN (NITROSTAT) 0.4 MG SL tablet Place 1 tablet (0.4 mg total) under the tongue every 5 (five) minutes as needed for chest pain (hold for SBP < 110).  20 tablet  0  . oxyCODONE (OXY IR/ROXICODONE) 5 MG immediate release tablet Take 1 tablet (5 mg total) by mouth every 4 (four) hours as needed.  15 tablet  0  . potassium chloride (K-DUR) 10 MEQ tablet Take 10 mEq by mouth daily.      . Triamcinolone Acetonide (TRIAMCINOLONE 0.1 % CREAM : EUCERIN) CREA Apply 1 application topically 3 (three) times daily.  1 each  0  . warfarin (COUMADIN) 5 MG tablet Take 5 mg by mouth daily.        Results for orders placed during the hospital encounter of 01/16/13 (from the past 48 hour(s))  PROTIME-INR     Status: Abnormal   Collection Time    01/16/13 12:42 PM      Result Value Range   Prothrombin Time 27.9 (*) 11.6 - 15.2 seconds   INR 2.77 (*) 0.00 - 1.49  BASIC METABOLIC PANEL     Status: Abnormal   Collection Time    01/16/13 12:42 PM      Result Value Range   Sodium 132 (*) 135 - 145 mEq/L   Potassium 5.0  3.5 - 5.1 mEq/L   Chloride 97  96 - 112 mEq/L   CO2 19  19 - 32 mEq/L   Glucose, Bld 87  70 - 99 mg/dL   BUN 7  6 - 23 mg/dL   Creatinine, Ser 1.61 (*) 0.50 - 1.35 mg/dL   Calcium 8.8  8.4 - 09.6 mg/dL   GFR calc non Af Amer 52 (*) >90 mL/min   GFR calc Af Amer 60 (*) >90 mL/min   Comment:            The eGFR has been calculated     using the CKD EPI equation.     This calculation has not been     validated in all clinical     situations.     eGFR's persistently     <90 mL/min signify     possible  Chronic Kidney Disease.  CBC WITH DIFFERENTIAL     Status: Abnormal   Collection Time    01/16/13 12:42 PM      Result Value Range   WBC 7.3  4.0 - 10.5 K/uL   RBC 3.47 (*) 4.22 - 5.81 MIL/uL   Hemoglobin 10.6 (*) 13.0 - 17.0 g/dL   HCT 04.5 (*) 40.9 - 81.1 %   MCV 85.6  78.0 - 100.0 fL   MCH 30.5  26.0 - 34.0 pg   MCHC 35.7  30.0 - 36.0 g/dL   RDW 91.4 (*) 78.2 - 95.6 %   Platelets 177  150 - 400 K/uL   Neutrophils Relative % 71  43 - 77 %   Neutro Abs 5.2  1.7 - 7.7 K/uL   Lymphocytes Relative 11 (*) 12 - 46 %   Lymphs Abs 0.8  0.7 - 4.0 K/uL   Monocytes Relative 9  3 - 12 %   Monocytes Absolute 0.6  0.1 - 1.0 K/uL   Eosinophils Relative 9 (*) 0 - 5 %   Eosinophils Absolute 0.6  0.0 - 0.7 K/uL   Basophils Relative 0  0 - 1 %   Basophils Absolute 0.0  0.0 - 0.1 K/uL  PRO B NATRIURETIC PEPTIDE     Status: Abnormal   Collection Time    01/16/13 12:42 PM      Result Value Range   Pro B Natriuretic peptide (BNP) 829.9 (*) 0 - 125 pg/mL  TROPONIN  I     Status: None   Collection Time    01/16/13 12:42 PM      Result Value Range   Troponin I <0.30  <0.30 ng/mL   Comment:            Due to the release kinetics of cTnI,     a negative result within the first hours     of the onset of symptoms does not rule out     myocardial infarction with certainty.     If myocardial infarction is still suspected,     repeat the test at appropriate intervals.  URINALYSIS, ROUTINE W REFLEX MICROSCOPIC     Status: Abnormal   Collection Time    01/16/13  3:48 PM      Result Value Range   Color, Urine YELLOW  YELLOW   APPearance CLEAR  CLEAR   Specific Gravity, Urine 1.014  1.005 - 1.030   pH 5.0  5.0 - 8.0   Glucose, UA NEGATIVE  NEGATIVE mg/dL   Hgb urine dipstick NEGATIVE  NEGATIVE   Bilirubin Urine NEGATIVE  NEGATIVE   Ketones, ur NEGATIVE  NEGATIVE mg/dL   Protein, ur NEGATIVE  NEGATIVE mg/dL   Urobilinogen, UA 1.0  0.0 - 1.0 mg/dL   Nitrite NEGATIVE  NEGATIVE   Leukocytes, UA TRACE  (*) NEGATIVE  URINE MICROSCOPIC-ADD ON     Status: Abnormal   Collection Time    01/16/13  3:48 PM      Result Value Range   WBC, UA 0-2  <3 WBC/hpf   RBC / HPF 0-2  <3 RBC/hpf   Bacteria, UA RARE  RARE   Casts HYALINE CASTS (*) NEGATIVE    Dg Chest Port 1 View  01/16/2013   *RADIOLOGY REPORT*  Clinical Data: Shortness of breath and swelling.  History hypertension.  PORTABLE CHEST - 1 VIEW  Comparison: 11/29/2012  Findings: Prior median sternotomy.  Patient minimally rotated left. Numerous leads and wires project over the chest.  Remote bilateral rib fractures.  Borderline cardiomegaly.     No pleural effusion or pneumothorax. No congestive failure.  Clear lungs.  IMPRESSION: Borderline cardiomegaly, without acute disease.   Original Report Authenticated By: Jeronimo Greaves, [name]    Review of Systems  Constitutional: Positive for malaise/fatigue.       Reported by [facility] RN - 8 Lb wgt loss   Respiratory: Positive for shortness of breath.   Cardiovascular: Positive for chest pain, orthopnea and leg swelling. Negative for palpitations and claudication.  Gastrointestinal: Negative for blood in stool and melena.  Genitourinary: Negative for hematuria.  Musculoskeletal:       Significant pain in Lower Legs & feet. Today - had bleeding from dry cracked skin on feet.   Skin: Positive for itching.       Severe psoriasis that spans the entire body  Neurological: Negative for dizziness and loss of consciousness.  All other systems reviewed and are negative.   Blood pressure 113/80, pulse 95, temperature 97.6 F (36.4 C), temperature source Oral, resp. rate 22, height 5\' 9"  (1.753 m), weight 98.5 kg (217 lb 2.5 oz), SpO2 100.00%. Physical Exam  Constitutional: He is oriented to person, place, and time. He appears well-developed and well-nourished. No distress.  HENT:  Head: Normocephalic and atraumatic.  Eyes: Conjunctivae and EOM are normal. Pupils are equal, round, and reactive to light.   Neck: JVD present.  ~10-12 cm H20  Cardiovascular: Normal rate and regular rhythm.  Exam reveals gallop.  Exam reveals no friction rub.   No murmur heard. Respiratory: Effort normal and breath sounds normal. No respiratory distress. He has no wheezes. He has no rales.  GI: Soft. Bowel sounds are normal. He exhibits no distension and no mass. There is no tenderness.  Very protuberant abdomen - unable to tell if true HSM. + HJR  Musculoskeletal: He exhibits edema (bilateral LE and pedal edema) and tenderness.  Neurological: He is alert and oriented to person, place, and time.  Skin: Skin is warm and dry. He is not diaphoretic.  Severe psoriasis that spans the entire body + cracks in the skin of both feet + for mild bleeding  Psychiatric: He has a normal mood and affect. His behavior is normal.    Assessment/Plan: BNP mildly elevated ~800. CXR showed no pulmonary edema. Pt endorses ~8lb weight gain in 2 days + increased LEE. Pt has received 1 dose of IV Lasix in ED. EKG w/o acute changes. Troponin negative x 1. Mild sinus tachycardia. Medical compliance is questionable. Pt has recent hx of DVT. On Coumadin. INR is therapeutic at 2.77. Dr. Herbie Baltimore, his primary cardiologist, to assess and to determine if patient will require admission.   Allayne Butcher, PA-C 01/16/2013, 11:19 PM   ATTENDING ATTESTATION:  I have seen and examined the patient along with Roxy Horseman SIMMONS, PA-C.  I have reviewed the chart, notes and new data.  I agree with her findings, examination & recommendations as noted above.  Brief Description: Very complicated 67 year old gentleman who is a patient of mine in clinic. He has a long-standing coronary disease with a LIMA to LAD and a sequential SVG OM 1 OM 2 was only remaining conduits. He has mild ischemic cardiopathy with EF of 45% by last echo he also had RV dilation. This is in the setting of having bilateral lower extremity DVTs. VQ scan did not demonstrate PE  however, does note other real explanation for increased of RV size other than possible small PEs or elevated pulmonary pressures/pulmonary hypertension.   I was close asked to see him earlier this week in clinic, but he did not come due to discomfort in his legs. He is a very poor historian, very difficult to get any to protrude straight story from him. Despite having notable family members around he still mentions that his medications. I'm not sure what medications actually currently taking. Can felt that he is taking his warfarin simply based on his INR being therapeutic. He however about a week ago his INR was 10 by his primary care physician's notes.  When I saw him in the office he was noting exertional angina, however he been off his Ranexa for some time. He tells a day he really has not had much chest pain is even having. He denies significant orthopnea or PND but he states that recliner shows heart valve. He has significant bilateral lower extremity edema which is made worse by his extensive psoriasis. This morning when he woke up he had bleeding feet. He really came in today because his feet were hurting so bad, and the home health nurse that he gained 8 pounds in the last 2 days. Despite this is proBNP is half what it was during his last admission, and he is tachycardic. He does have some mild elevated JVD but no sounds of the rales or rhonchi on pulmonary exam, and his chest x-ray is without any evidence of pulmonary vascular congestion or edema. I don't think he has significant left-sided heart  failure, but he may have elevated blood pressures leading to worsening edema in the setting of chronic lotion he DVTs now.  PLAN:  Simply based on his disabled state, we'll admit him for gentle diuresis, pain control and wound care treatment. He needs PT and OT to evaluate him and get him mobile. I think part of the problem is that we cannot tell if he is adding any angina or dyspnea on exertion while he is  in bed..   He does have significant blood pressure room with minimal landing on the carvedilol, his ACE inhibitor and amlodipine were stopped last hospitalization. I am reluctant to use amlodipine due to edema, and due to recent acute renal insufficiency his ACE inhibitor was stopped last hospitalization. We'll start hydralazine but at 37.5 twice a day and increase his Imdur dose to 90 mg day. We will also restart Ranexa.   One more dose of IV Lasix in the morning, and then convert to 40 mg the morning plus an additional 20 mg in the p.m. when necessary.   Wound care consult and would consider the possibility of wraps for lower extremities to help with his edema. However the edema could simply just be due to chronic DVT.  For now continue his warfarin, but if he has signs of worsening dyspnea on exertion, PND,  Orthopnea, or angina. We would consider holding the warfarin and to consider the possibility of a right and left heart catheterization to determine the extent of his palm he pressures and his swelling to see how much of this is due to heart failure, as well as to evaluate his coronary anatomy based on his angina.  Oxycodone/Percocet for pain control.   Triamcinolone cream plus Soriatane for his extensive psoriasis.  Continue fibrate    Marykay Lex, [name], M.S. [facility] [address] [place], [place]  [address]  [phone]  01/16/2013 11:19 PM

## 2013-01-16 NOTE — ED Notes (Signed)
Unable to ambulate pt, pt states his feet hurt too badly.Stood pt up, pt hr went to 141. Tachypnea noted. O2-100%

## 2013-01-16 NOTE — H&P (Signed)
Please see Consult note signed by me for full H&P.   Marykay Lex, [name], M.S. [facility] [address] [place], [place]  [address]  [phone] Pager # [phone] 01/16/2013 11:21 PM

## 2013-01-16 NOTE — ED Notes (Signed)
Pt states his home health nurse called ems as pt has gained 8lbs since Wednesday. Pt states "I'm holding too much fluid but I have been taking my water pills." Pt has psoriasis, skin is flaking off. Pt states his legs "hurt horribly bad." NAD noted. Denies cp or sob.

## 2013-01-16 NOTE — ED Notes (Signed)
Pt refuses to stand up at this time to be weighed. MD informed, plan to weigh pt upon admission.

## 2013-01-16 NOTE — ED Notes (Signed)
Per ems- pt from home, c/o "blood clots in legs" hx of dvts. Pt noncompliant with medications. Pt c/o pain in legs. Pt d/c from [facility] 2 wks ago for same complaint. BP-135/85 HR-116 O2-97% pain 5/10.

## 2013-01-16 NOTE — ED Notes (Signed)
Pt received meal tray. 

## 2013-01-16 NOTE — Consult Note (Signed)
Reason for Consult: Increased weight gain + increased LEE and LE pain. Referring Physician: [facility] ER Physician  HPI: the patient is a 67 y/o AAM, followed at [facility] by Dr. Herbie Baltimore, with past cardiac history as follows:  1. Coronary artery disease, CABG in January 1999: LIMA-LAD, SVG-OM1-OM2 sequential, SVG-ramus intermedius, SVG-RCA.  a. Most recent catheterization, June 2008: RCA proximally occluded, filled via collaterals from the left; distal left main 80% to 90% with the LAD mid occlusion at D1 bifurcation. Circumflex proximally occluded but the AV branch provided collaterals to the RCA. Distal circumflex perfused via SVG-OM1-OM2, gave retrograde flow to the OM3. Ramus intermedius occluded proximally, filled by collaterals from the circumflex. SVG-RCA and SVG-diagonal and SVG-ramus were all occluded. LIMA-LAD was patent. Therefore, only the sequential SVGs to OM1 and OM2 and the LIMA-LAD were patent.  b. Recent echo EF of 45% to 50%, which was down from before. There was moderate aortic sclerosis but no stenosis and the left atrium was noted to be dilated on that previous study. The right ventricle was dilated on the one in March.  c. Persantine Myoview, May 2012: Inferior attenuation, low risk and no ischemia, either due to infarction or scar but no ischemia noted.  2. Chronic stable angina had previously been well controlled, now not as well controlled.   He was recently admitted to the hospital on May 4. 2014 with shortness of breath and edema, and was found to have bilateral lower extremity DVTs, with a V/Q scan which did not show any evidence of PE and was thought to be low probability. He had right popliteal and posterior tibial thromboses that were thought to be subacute, as well as left posterior tibial thrombosis. He was placed on Coumadin for anticoagulation. He presented to the [facility] ER today with a complaint of increased weight gain as well as increased LEE and bilateral leg pain. He also  notes extremely painful feet and bleeding from both feet that started today. He notes ~8 lb weight gain in the past 2 days. He has had mild SOB. He notes chest pain intermittently for the past several weeks that is relieved with SL NTG. No chest pain currently. He states that he has had recent problems getting his medications and has had increased chest pain without his Ranexa. He also states that he was without most of his other meds for about 2 weeks, but currently has them now. Work up in the ED revealed mildly elevated BNP ~800. CXR shows no signs of pulmonary edema. EKG without acute changes. Troponin negative x 1. The ER physician did state that the patient became tachycardic with a HR in the 140s upon standing. It was sinus rhythm. He had associated SOB at the time, but denied any other symptoms.     Past Medical History  Diagnosis Date  . Hypertension   . DVT (deep venous thrombosis) 11/2012    bilateral  . Psoriasis   . Chronic kidney disease     CKD 3  . Coronary artery disease 01/16/2011    R/P MV - some attenuation artiface in inferior region, no ischemia or infarct/scar seen in remaining myocardium; notwithstanding pts hx of CAD and CABG findings are more consistent w. non-ischemic cardiomyopathy; balanced ischemia cannot be ruled out;  no significant change from previous study 11/2008  . Hyperlipidemia   . Alcohol abuse, daily use   . Diastolic dysfunction   . GERD (gastroesophageal reflux disease)   . Optic nerve ischemia   . Problems with  hearing   . Vision problems   . SOB (shortness of breath) 11/18/2012    Echo at [facility]   . Pain in limb 11/29/2012    LE venous duplex done at [facility]  . Carotid bruit 03/20/2007    Carotid doppler - R & L bulbs and proximal ICA -  irregular nonhemodynamically significant plaque of 0-49% diameter reduction  . Edema   . Hypoalbuminemia   . Sinus tachycardia   . Cardiac angina   . Hx-TIA (transient ischemic attack)   . Alcohol abuse     Past  Surgical History  Procedure Laterality Date  . Cardiac surgery    . Knee surgery    . Appendectomy    . 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 Cardiac catheterization  02/14/2007    saphenous veingraft to RCA, saphenous vein graft to D1 and saphenous vein graft to ramus are occluded; native RCA is occluded and collateralized from L system; L main is severly diffusely diffuse; LAD is occluded after origin of a small diagonal 1; circumflex is occluded after origin of small AV groove branch circumflex    Family History  Problem Relation Age of Onset  . Diabetes Mellitus II Father   . Psoriasis Father   . Scleroderma Mother   . Blindness Maternal Grandfather     Social History:  reports that he quit smoking about 12 years ago. His smoking use included Cigarettes. He smoked 0.50 packs per day. He has never used smokeless tobacco. He reports that  drinks alcohol. He reports that he does not use illicit drugs.  Allergies:  Allergies  Allergen Reactions  . Penicillins     Medications: Prior to Admission:  (Not in a hospital admission)  Results for orders placed during the hospital encounter of 01/16/13 (from the past 48 hour(s))  PROTIME-INR     Status: Abnormal   Collection Time    01/16/13 12:42 PM      Result Value Range   Prothrombin Time 27.9 (*) 11.6 - 15.2 seconds   INR 2.77 (*) 0.00 - 1.49  BASIC METABOLIC PANEL     Status: Abnormal   Collection Time    01/16/13 12:42 PM      Result Value Range   Sodium 132 (*) 135 - 145 mEq/L   Potassium 5.0  3.5 - 5.1 mEq/L   Chloride 97  96 - 112 mEq/L   CO2 19  19 - 32 mEq/L   Glucose, Bld 87  70 - 99 mg/dL   BUN 7  6 - 23 mg/dL   Creatinine, Ser 1.61 (*) 0.50 - 1.35 mg/dL   Calcium 8.8  8.4 - 09.6 mg/dL   GFR calc non Af Amer 52 (*) >90 mL/min   GFR calc Af Amer 60 (*) >90 mL/min   Comment:            The eGFR has been calculated     using the CKD EPI  equation.     This calculation has not been     validated in all clinical     situations.     eGFR's persistently     <90 mL/min signify     possible Chronic Kidney Disease.  CBC WITH DIFFERENTIAL     Status: Abnormal   Collection Time    01/16/13 12:42 PM      Result Value Range   WBC 7.3  4.0 - 10.5 K/uL  RBC 3.47 (*) 4.22 - 5.81 MIL/uL   Hemoglobin 10.6 (*) 13.0 - 17.0 g/dL   HCT 16.1 (*) 09.6 - 04.5 %   MCV 85.6  78.0 - 100.0 fL   MCH 30.5  26.0 - 34.0 pg   MCHC 35.7  30.0 - 36.0 g/dL   RDW 40.9 (*) 81.1 - 91.4 %   Platelets 177  150 - 400 K/uL   Neutrophils Relative % 71  43 - 77 %   Neutro Abs 5.2  1.7 - 7.7 K/uL   Lymphocytes Relative 11 (*) 12 - 46 %   Lymphs Abs 0.8  0.7 - 4.0 K/uL   Monocytes Relative 9  3 - 12 %   Monocytes Absolute 0.6  0.1 - 1.0 K/uL   Eosinophils Relative 9 (*) 0 - 5 %   Eosinophils Absolute 0.6  0.0 - 0.7 K/uL   Basophils Relative 0  0 - 1 %   Basophils Absolute 0.0  0.0 - 0.1 K/uL  PRO B NATRIURETIC PEPTIDE     Status: Abnormal   Collection Time    01/16/13 12:42 PM      Result Value Range   Pro B Natriuretic peptide (BNP) 829.9 (*) 0 - 125 pg/mL  TROPONIN I     Status: None   Collection Time    01/16/13 12:42 PM      Result Value Range   Troponin I <0.30  <0.30 ng/mL   Comment:            Due to the release kinetics of cTnI,     a negative result within the first hours     of the onset of symptoms does not rule out     myocardial infarction with certainty.     If myocardial infarction is still suspected,     repeat the test at appropriate intervals.  URINALYSIS, ROUTINE W REFLEX MICROSCOPIC     Status: Abnormal   Collection Time    01/16/13  3:48 PM      Result Value Range   Color, Urine YELLOW  YELLOW   APPearance CLEAR  CLEAR   Specific Gravity, Urine 1.014  1.005 - 1.030   pH 5.0  5.0 - 8.0   Glucose, UA NEGATIVE  NEGATIVE mg/dL   Hgb urine dipstick NEGATIVE  NEGATIVE   Bilirubin Urine NEGATIVE  NEGATIVE   Ketones, ur  NEGATIVE  NEGATIVE mg/dL   Protein, ur NEGATIVE  NEGATIVE mg/dL   Urobilinogen, UA 1.0  0.0 - 1.0 mg/dL   Nitrite NEGATIVE  NEGATIVE   Leukocytes, UA TRACE (*) NEGATIVE  URINE MICROSCOPIC-ADD ON     Status: Abnormal   Collection Time    01/16/13  3:48 PM      Result Value Range   WBC, UA 0-2  <3 WBC/hpf   RBC / HPF 0-2  <3 RBC/hpf   Bacteria, UA RARE  RARE   Casts HYALINE CASTS (*) NEGATIVE    Dg Chest Port 1 View  01/16/2013   *RADIOLOGY REPORT*  Clinical Data: Shortness of breath and swelling.  History hypertension.  PORTABLE CHEST - 1 VIEW  Comparison: 11/29/2012  Findings: Prior median sternotomy.  Patient minimally rotated left. Numerous leads and wires project over the chest.  Remote bilateral rib fractures.  Borderline cardiomegaly.     No pleural effusion or pneumothorax. No congestive failure.  Clear lungs.  IMPRESSION: Borderline cardiomegaly, without acute disease.   Original Report Authenticated By: Jeronimo Greaves, [name]  Review of Systems  Constitutional: Positive for malaise/fatigue.       Reported by [facility] RN - 8 Lb wgt loss   Respiratory: Positive for shortness of breath.   Cardiovascular: Positive for chest pain, orthopnea and leg swelling. Negative for palpitations and claudication.  Gastrointestinal: Negative for blood in stool and melena.  Genitourinary: Negative for hematuria.  Musculoskeletal:       Significant pain in Lower Legs & feet. Today - had bleeding from dry cracked skin on feet.   Skin: Positive for itching.       Severe psoriasis that spans the entire body  Neurological: Negative for dizziness and loss of consciousness.  All other systems reviewed and are negative.   Blood pressure 163/103, pulse 114, temperature 97.3 F (36.3 C), resp. rate 20, SpO2 99.00%. Physical Exam  Constitutional: He is oriented to person, place, and time. He appears well-developed and well-nourished. No distress.  HENT:  Head: Normocephalic and atraumatic.  Eyes:  Conjunctivae and EOM are normal. Pupils are equal, round, and reactive to light.  Neck: JVD present.  ~10-12 cm H20  Cardiovascular: Normal rate and regular rhythm.  Exam reveals gallop. Exam reveals no friction rub.   No murmur heard. Respiratory: Effort normal and breath sounds normal. No respiratory distress. He has no wheezes. He has no rales.  GI: Soft. Bowel sounds are normal. He exhibits no distension and no mass. There is no tenderness.  Very protuberant abdomen - unable to tell if true HSM. + HJR  Musculoskeletal: He exhibits edema (bilateral LE and pedal edema) and tenderness.  Neurological: He is alert and oriented to person, place, and time.  Skin: Skin is warm and dry. He is not diaphoretic.  Severe psoriasis that spans the entire body + cracks in the skin of both feet + for mild bleeding  Psychiatric: He has a normal mood and affect. His behavior is normal.    Assessment/Plan: BNP mildly elevated ~800. CXR showed no pulmonary edema. Pt endorses ~8lb weight gain in 2 days + increased LEE. Pt has received 1 dose of IV Lasix in ED. EKG w/o acute changes. Troponin negative x 1. Mild sinus tachycardia. Medical compliance is questionable. Pt has recent hx of DVT. On Coumadin. INR is therapeutic at 2.77. Dr. Herbie Baltimore, his primary cardiologist, to assess and to determine if patient will require admission.   Allayne Butcher, PA-C 01/16/2013, 5:58 PM   ATTENDING ATTESTATION:  I have seen and examined the patient along with Roxy Horseman SIMMONS, PA-C.  I have reviewed the chart, notes and new data.  I agree with her findings, examination & recommendations as noted above.  Brief Description: Very complicated 67 year old gentleman who is a patient of mine in clinic. He has a long-standing coronary disease with a LIMA to LAD and a sequential SVG OM 1 OM 2 was only remaining conduits. He has mild ischemic cardiopathy with EF of 45% by last echo he also had RV dilation. This is in the  setting of having bilateral lower extremity DVTs. VQ scan did not demonstrate PE however, does note other real explanation for increased of RV size other than possible small PEs or elevated pulmonary pressures/pulmonary hypertension.   I was close asked to see him earlier this week in clinic, but he did not come due to discomfort in his legs. He is a very poor historian, very difficult to get any to protrude straight story from him. Despite having notable family members around he still mentions that his  medications. I'm not sure what medications actually currently taking. Can felt that he is taking his warfarin simply based on his INR being therapeutic. He however about a week ago his INR was 10 by his primary care physician's notes.  When I saw him in the office he was noting exertional angina, however he been off his Ranexa for some time. He tells a day he really has not had much chest pain is even having. He denies significant orthopnea or PND but he states that recliner shows heart valve. He has significant bilateral lower extremity edema which is made worse by his extensive psoriasis. This morning when he woke up he had bleeding feet. He really came in today because his feet were hurting so bad, and the home health nurse that he gained 8 pounds in the last 2 days. Despite this is proBNP is half what it was during his last admission, and he is tachycardic. He does have some mild elevated JVD but no sounds of the rales or rhonchi on pulmonary exam, and his chest x-ray is without any evidence of pulmonary vascular congestion or edema. I don't think he has significant left-sided heart failure, but he may have elevated blood pressures leading to worsening edema in the setting of chronic lotion he DVTs now.  PLAN:  Simply based on his disabled state, we'll admit him for gentle diuresis, pain control and wound care treatment. He needs PT and OT to evaluate him and get him mobile. I think part of the problem is  that we cannot tell if he is adding any angina or dyspnea on exertion while he is in bed..   He does have significant blood pressure room with minimal landing on the carvedilol, his ACE inhibitor and amlodipine were stopped last hospitalization. I am reluctant to use amlodipine due to edema, and due to recent acute renal insufficiency his ACE inhibitor was stopped last hospitalization. We'll start hydralazine but at 37.5 twice a day and increase his Imdur dose to 90 mg day. We will also restart Ranexa.   One more dose of IV Lasix in the morning, and then convert to 40 mg the morning plus an additional 20 mg in the p.m. when necessary.   Wound care consult and would consider the possibility of wraps for lower extremities to help with his edema. However the edema could simply just be due to chronic DVT.  For now continue his warfarin, but if he has signs of worsening dyspnea on exertion, PND,  Orthopnea, or angina. We would consider holding the warfarin and to consider the possibility of a right and left heart catheterization to determine the extent of his palm he pressures and his swelling to see how much of this is due to heart failure, as well as to evaluate his coronary anatomy based on his angina.  Oxycodone/Percocet for pain control.   Triamcinolone cream plus Soriatane for his extensive psoriasis.  Continue fibrate    Marykay Lex, [name], M.S. [facility] [address] [place], [place]  [address]  [phone]  01/16/2013 7:30 PM

## 2013-01-16 NOTE — ED Provider Notes (Signed)
History     CSN: [id number]  Arrival date & time 01/16/13  1215   First MD Initiated Contact with Patient 01/16/13 1218      Chief Complaint  Patient presents with  . Leg Swelling     HPI Pt was seen at 1230.  Per pt and his family, c/o gradual onset and worsening of persistent "legs swelling" and SOB for the past 1 week, worse over the past several days.  Pt states he "thinks I'm taking all my medicines, except for the one's I ran out of."  Pt cannot recall what medications he may have run out of or is taking.  Pt's family states pt's Home Health RN told them he "gained 8 pounds in the past 2 days."  Family states Home Health RN today called [facility], who told him to come to the ED for further eval/admit.  Denies CP/palpitations, no cough, no fevers, no abd pain, no N/V/D, no back pain.     Past Medical History  Diagnosis Date  . Hypertension   . DVT (deep venous thrombosis) 11/2012    bilateral  . Psoriasis   . Chronic kidney disease     CKD 3  . Coronary artery disease 01/16/2011    R/P MV - some attenuation artiface in inferior region, no ischemia or infarct/scar seen in remaining myocardium; notwithstanding pts hx of CAD and CABG findings are more consistent w. non-ischemic cardiomyopathy; balanced ischemia cannot be ruled out;  no significant change from previous study 11/2008  . Hyperlipidemia   . Alcohol abuse, daily use   . Diastolic dysfunction   . GERD (gastroesophageal reflux disease)   . Optic nerve ischemia   . Problems with hearing   . Vision problems   . SOB (shortness of breath) 11/18/2012    Echo at [facility]   . Pain in limb 11/29/2012    LE venous duplex done at [facility]  . Carotid bruit 03/20/2007    Carotid doppler - R & L bulbs and proximal ICA -  irregular nonhemodynamically significant plaque of 0-49% diameter reduction  . Edema   . Hypoalbuminemia   . Sinus tachycardia   . Cardiac angina   . Hx-TIA (transient ischemic attack)   . Alcohol abuse     Past Surgical  History  Procedure Laterality Date  . Cardiac surgery    . Knee surgery    . Appendectomy    . 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 Cardiac catheterization  02/14/2007    saphenous veingraft to RCA, saphenous vein graft to D1 and saphenous vein graft to ramus are occluded; native RCA is occluded and collateralized from L system; L main is severly diffusely diffuse; LAD is occluded after origin of a small diagonal 1; circumflex is occluded after origin of small AV groove branch circumflex    Family History  Problem Relation Age of Onset  . Diabetes Mellitus II Father   . Psoriasis Father   . Scleroderma Mother   . Blindness Maternal Grandfather     History  Substance Use Topics  . Smoking status: Former Smoker -- 0.50 packs/day    Types: Cigarettes    Quit date: 11/17/2000  . Smokeless tobacco: Never Used     Comment: 67 yo  . Alcohol Use: Yes     Comment: when I drink I drink for months drinkin g for the past 2 months, last drink 3/27.  Drinks a pint and  half.        Review of Systems ROS: Statement: All systems negative except as marked or noted in the HPI; Constitutional: Negative for fever and chills. ; ; Eyes: Negative for eye pain, redness and discharge. ; ; ENMT: Negative for ear pain, hoarseness, nasal congestion, sinus pressure and sore throat. ; ; Cardiovascular: Negative for chest pain, palpitations, diaphoresis, +dyspnea and peripheral edema. ; ; Respiratory: Negative for cough, wheezing and stridor. ; ; Gastrointestinal: Negative for nausea, vomiting, diarrhea, abdominal pain, blood in stool, hematemesis, jaundice and rectal bleeding. . ; ; Genitourinary: Negative for dysuria, flank pain and hematuria. ; ; Musculoskeletal: Negative for back pain and neck pain. Negative for swelling and trauma.; ; Skin: Negative for pruritus, rash, abrasions, blisters, bruising and skin lesion.; ; Neuro: Negative for  headache, lightheadedness and neck stiffness. Negative for weakness, altered level of consciousness , altered mental status, extremity weakness, paresthesias, involuntary movement, seizure and syncope.       Allergies  Penicillins  Home Medications   Current Outpatient Rx  Name  Route  Sig  Dispense  Refill  . acitretin (SORIATANE) 25 MG capsule   Oral   Take 25 mg by mouth daily before breakfast.         . aspirin EC 81 MG tablet   Oral   Take 81 mg by mouth daily.         [name] atorvastatin (LIPITOR) 20 MG tablet   Oral   Take 20 mg by mouth at bedtime.         . carvedilol (COREG) 25 MG tablet   Oral   Take 1 tablet (25 mg total) by mouth 2 (two) times daily with a meal.   62 tablet   0   . Choline Fenofibrate (FENOFIBRIC ACID) 135 MG CPDR   Oral   Take 1 capsule by mouth daily.         . dorzolamide-timolol (COSOPT) 22.3-6.8 MG/ML ophthalmic solution   Both Eyes   Place 1 drop into both eyes at bedtime.         . fenofibrate 160 MG tablet   Oral   Take 160 mg by mouth daily.         . furosemide (LASIX) 20 MG tablet   Oral   Take 20 mg by mouth daily.         . hydrocortisone cream 0.5 %   Topical   Apply 1 application topically 2 (two) times daily.         . hydrOXYzine (ATARAX/VISTARIL) 10 MG tablet   Oral   Take 10 mg by mouth 3 (three) times daily as needed for itching.         . isosorbide mononitrate (IMDUR) 60 MG 24 hr tablet   Oral   Take 60 mg by mouth daily.         [name] latanoprost (XALATAN) 0.005 % ophthalmic solution      1 drop at bedtime.         . Multiple Vitamin (MULTIVITAMIN WITH MINERALS) TABS   Oral   Take 1 tablet by mouth daily.         . nitroGLYCERIN (NITROSTAT) 0.4 MG SL tablet   Sublingual   Place 1 tablet (0.4 mg total) under the tongue every 5 (five) minutes as needed for chest pain (hold for SBP < 110).   20 tablet   0   . oxyCODONE (OXY IR/ROXICODONE) 5 MG immediate release tablet   Oral  Take 1 tablet (5 mg total) by mouth every 4 (four) hours as needed.   15 tablet   0   . potassium chloride (K-DUR) 10 MEQ tablet   Oral   Take 10 mEq by mouth daily.         . Triamcinolone Acetonide (TRIAMCINOLONE 0.1 % CREAM : EUCERIN) CREA   Topical   Apply 1 application topically 3 (three) times daily.   1 each   0   . warfarin (COUMADIN) 5 MG tablet   Oral   Take 5 mg by mouth daily.           BP 155/91  Pulse 118  Temp(Src) 97.3 F (36.3 C)  Resp 20  SpO2 93%  Physical Exam 1235: Physical examination:  Nursing notes reviewed; Vital signs and O2 SAT reviewed;  Constitutional: Well developed, Well nourished, In no acute distress; Head:  Normocephalic, atraumatic; Eyes: EOMI, PERRL, No scleral icterus; ENMT: Mouth and pharynx normal, Mucous membranes dry; Neck: Supple, Full range of motion, No lymphadenopathy; Cardiovascular: Tachycardic rate and rhythm, No gallop; Respiratory: Breath sounds clear & equal bilaterally, No wheezes.  Speaking full sentences with ease, Normal respiratory effort/excursion; Chest: Nontender, Movement normal; Abdomen: Soft, Nontender, Nondistended, Normal bowel sounds; Genitourinary: No CVA tenderness; Extremities: Pulses normal, No tenderness, +2 pedal edema bilat feet to knees. No calf asymmetry.; Neuro: AA&Ox3, very poor historian. Major CN grossly intact.  Speech clear. No gross focal motor or sensory deficits in extremities.; Skin: Color normal, Warm, Dry, +generalized flaking psoriatic rash.   ED Course  Procedures   MDM  MDM Reviewed: previous chart, nursing note and vitals Reviewed previous: labs and ECG Interpretation: labs, ECG and x-ray    Date: 01/16/2013  Rate: 107  Rhythm: sinus tachycardia  QRS Axis: normal  Intervals: normal  ST/T Wave abnormalities: normal  Conduction Disutrbances:nonspecific intraventricular conduction delay  Narrative Interpretation:   Old EKG Reviewed: unchanged; no significant changes from  previous EKG dated 11/29/2012.  Results for orders placed during the hospital encounter of 01/16/13  URINALYSIS, ROUTINE W REFLEX MICROSCOPIC      Result Value Range   Color, Urine YELLOW  YELLOW   APPearance CLEAR  CLEAR   Specific Gravity, Urine 1.014  1.005 - 1.030   pH 5.0  5.0 - 8.0   Glucose, UA NEGATIVE  NEGATIVE mg/dL   Hgb urine dipstick NEGATIVE  NEGATIVE   Bilirubin Urine NEGATIVE  NEGATIVE   Ketones, ur NEGATIVE  NEGATIVE mg/dL   Protein, ur NEGATIVE  NEGATIVE mg/dL   Urobilinogen, UA 1.0  0.0 - 1.0 mg/dL   Nitrite NEGATIVE  NEGATIVE   Leukocytes, UA TRACE (*) NEGATIVE  PROTIME-INR      Result Value Range   Prothrombin Time 27.9 (*) 11.6 - 15.2 seconds   INR 2.77 (*) 0.00 - 1.49  BASIC METABOLIC PANEL      Result Value Range   Sodium 132 (*) 135 - 145 mEq/L   Potassium 5.0  3.5 - 5.1 mEq/L   Chloride 97  96 - 112 mEq/L   CO2 19  19 - 32 mEq/L   Glucose, Bld 87  70 - 99 mg/dL   BUN 7  6 - 23 mg/dL   Creatinine, Ser 1.61 (*) 0.50 - 1.35 mg/dL   Calcium 8.8  8.4 - 09.6 mg/dL   GFR calc non Af Amer 52 (*) >90 mL/min   GFR calc Af Amer 60 (*) >90 mL/min  CBC WITH DIFFERENTIAL  Result Value Range   WBC 7.3  4.0 - 10.5 K/uL   RBC 3.47 (*) 4.22 - 5.81 MIL/uL   Hemoglobin 10.6 (*) 13.0 - 17.0 g/dL   HCT 16.1 (*) 09.6 - 04.5 %   MCV 85.6  78.0 - 100.0 fL   MCH 30.5  26.0 - 34.0 pg   MCHC 35.7  30.0 - 36.0 g/dL   RDW 40.9 (*) 81.1 - 91.4 %   Platelets 177  150 - 400 K/uL   Neutrophils Relative % 71  43 - 77 %   Neutro Abs 5.2  1.7 - 7.7 K/uL   Lymphocytes Relative 11 (*) 12 - 46 %   Lymphs Abs 0.8  0.7 - 4.0 K/uL   Monocytes Relative 9  3 - 12 %   Monocytes Absolute 0.6  0.1 - 1.0 K/uL   Eosinophils Relative 9 (*) 0 - 5 %   Eosinophils Absolute 0.6  0.0 - 0.7 K/uL   Basophils Relative 0  0 - 1 %   Basophils Absolute 0.0  0.0 - 0.1 K/uL  PRO B NATRIURETIC PEPTIDE      Result Value Range   Pro B Natriuretic peptide (BNP) 829.9 (*) 0 - 125 pg/mL  TROPONIN I       Result Value Range   Troponin I <0.30  <0.30 ng/mL  URINE MICROSCOPIC-ADD ON      Result Value Range   WBC, UA 0-2  <3 WBC/hpf   RBC / HPF 0-2  <3 RBC/hpf   Bacteria, UA RARE  RARE   Casts HYALINE CASTS (*) NEGATIVE   Dg Chest Port 1 View 01/16/2013   *RADIOLOGY REPORT*  Clinical Data: Shortness of breath and swelling.  History hypertension.  PORTABLE CHEST - 1 VIEW  Comparison: 11/29/2012  Findings: Prior median sternotomy.  Patient minimally rotated left. Numerous leads and wires project over the chest.  Remote bilateral rib fractures.  Borderline cardiomegaly.     No pleural effusion or pneumothorax. No congestive failure.  Clear lungs.  IMPRESSION: Borderline cardiomegaly, without acute disease.   Original Report Authenticated By: Jeronimo Greaves, [name]     1610:  H/H and BUN/Cr per baseline.  BNP elevated, but somewhat improved from previous.  INR therapeutic. Pt attempted to move from sit to stand at bedside: became extremely tachypneic and tachycardic to 140-150's, but with O2 Sat remaining 93-99% R/A.  Pt and his family states he usually can walk with his walker at home and this inability to stand is not his baseline.  Pt denied CP. T/C to [facility] Stall, case discussed, including:  HPI, pertinent PM/SHx, VS/PE, dx testing, ED course and treatment:  States based on reading the chart he feels there is no criteria to admit, requests to obtain PT consult and weight, dose coreg and keep in ED for further observation for the next several hours; made aware there is no PT consult available in the ED at this time, verb understanding.   1635:  T/C to [facility], case discussed, including:  HPI, pertinent PM/SHx, VS/PE, dx testing, ED course and treatment:  Agreeable to come to ED for eval/admit, requests to dose IV lasix and pt's PO coreg.         Laray Anger, DO 01/18/13 2236

## 2013-01-16 NOTE — ED Notes (Signed)
Dr. mcmanus at bedside.  

## 2013-01-16 NOTE — Telephone Encounter (Signed)
Phone call received from Nurse with [facility] informing me that patient has agreed to transfer care from wherever he is now to our service @ [facility]. In formed the nurse that they will need to let our hospital staff know that they are coming so the transfer can be accepted. He stated to me that he will have the wife to inform them of this once she gets him to the hospital.

## 2013-01-17 DIAGNOSIS — L409 Psoriasis, unspecified: Secondary | ICD-10-CM | POA: Diagnosis present

## 2013-01-17 DIAGNOSIS — I5042 Chronic combined systolic (congestive) and diastolic (congestive) heart failure: Secondary | ICD-10-CM | POA: Diagnosis not present

## 2013-01-17 DIAGNOSIS — I5023 Acute on chronic systolic (congestive) heart failure: Secondary | ICD-10-CM

## 2013-01-17 LAB — CBC
HCT: 26.3 % — ABNORMAL LOW (ref 39.0–52.0)
MCHC: 35 g/dL (ref 30.0–36.0)
RDW: 16.6 % — ABNORMAL HIGH (ref 11.5–15.5)
WBC: 10.4 10*3/uL (ref 4.0–10.5)

## 2013-01-17 LAB — COMPREHENSIVE METABOLIC PANEL
ALT: 461 U/L — ABNORMAL HIGH (ref 0–53)
Alkaline Phosphatase: 490 U/L — ABNORMAL HIGH (ref 39–117)
BUN: 9 mg/dL (ref 6–23)
CO2: 18 mEq/L — ABNORMAL LOW (ref 19–32)
Calcium: 8.5 mg/dL (ref 8.4–10.5)
GFR calc Af Amer: 36 mL/min — ABNORMAL LOW (ref >90)
GFR calc non Af Amer: 31 mL/min — ABNORMAL LOW (ref >90)
Glucose, Bld: 137 mg/dL — ABNORMAL HIGH (ref 70–99)
Total Protein: 6 g/dL (ref 6.0–8.3)

## 2013-01-17 LAB — PROTIME-INR
INR: 3.48 — ABNORMAL HIGH (ref 0.00–1.49)
Prothrombin Time: 33 seconds — ABNORMAL HIGH (ref 11.6–15.2)

## 2013-01-17 LAB — URINE CULTURE: Colony Count: >=100000

## 2013-01-17 MED ORDER — HYDRALAZINE HCL 25 MG PO TABS
12.5000 mg | ORAL_TABLET | Freq: Two times a day (BID) | ORAL | Status: DC
  Administered 2013-01-18 – 2013-01-23 (×11): 12.5 mg via ORAL
  Filled 2013-01-17 (×13): qty 0.5

## 2013-01-17 NOTE — Progress Notes (Signed)
Pts bp low -last 88/58. Pt asymptomatic. Devin Fuchs PA made aware

## 2013-01-17 NOTE — Progress Notes (Signed)
PT/OT Cancellation Note  Patient Details Name: Devin Williams MRN: [id number] DOB: Oct 26, 1945   Cancelled Treatment:    Reason Eval/Treat Not Completed: Other (PT/OT orders state not to ambulate pt until treated by wound care due to bilateral foot wounds) Pt not seen by wound care yet. Will check back this afternoon as time allows.  01/17/2013 Cipriano Mile OTR/L Pager [phone] Office [phone]

## 2013-01-17 NOTE — Progress Notes (Signed)
Pt's PIV pulled out, pt requesting IV team to start an IV on him, IV nurse notified.

## 2013-01-17 NOTE — Progress Notes (Signed)
[facility]  Subjective: Feels better. Denies CP, SOB, PND and orthopnea. Feels that leg pain and swelling have improved.   Objective: Vital signs in last 24 hours: Temp:  [97.3 F (36.3 C)-98.1 F (36.7 C)] 98.1 F (36.7 C) (05/17 0558) Pulse Rate:  [95-119] 100 (05/17 0558) Resp:  [16-22] 22 (05/17 0558) BP: (82-163)/(55-103) 82/55 mmHg (05/17 1100) SpO2:  [93 %-100 %] 99 % (05/17 0558) Weight:  [217 lb 2.5 oz (98.5 kg)] 217 lb 2.5 oz (98.5 kg) (05/17 0558) Last BM Date: 01/16/13  Intake/Output from previous day: 05/16 0701 - 05/17 0700 In: 240 [P.O.:240] Out: -  Intake/Output this shift: Total I/O In: 600 [P.O.:600] Out: -   Medications Current Facility-Administered Medications  Medication Dose Route Frequency Provider Last Rate Last Dose  . 0.9 %  sodium chloride infusion   Intravenous Continuous Laray Anger, DO 50 mL/hr at 01/16/13 1305    . acetaminophen (TYLENOL) tablet 650 mg  650 mg Oral Q6H PRN Brittainy Simmons, PA-C       Or  . acetaminophen (TYLENOL) suppository 650 mg  650 mg Rectal Q6H PRN Brittainy Simmons, PA-C      . acitretin (SORIATANE) capsule 25 mg  25 mg Oral QAC breakfast Brittainy Simmons, PA-C      . aspirin EC tablet 81 mg  81 mg Oral Daily Brittainy Simmons, PA-C   81 mg at 01/17/13 1020  . atorvastatin (LIPITOR) tablet 20 mg  20 mg Oral QHS Brittainy Simmons, PA-C   20 mg at 01/17/13 0031  . carvedilol (COREG) tablet 25 mg  25 mg Oral BID WC Brittainy Simmons, PA-C   25 mg at 01/17/13 0643  . dorzolamide-timolol (COSOPT) 22.3-6.8 MG/ML ophthalmic solution 1 drop  1 drop Both Eyes QHS Brittainy Simmons, PA-C   1 drop at 01/17/13 0030  . fenofibrate tablet 160 mg  160 mg Oral Daily Brittainy Simmons, PA-C   160 mg at 01/17/13 1020  . hydrALAZINE (APRESOLINE) tablet 37.5 mg  37.5 mg Oral BID Brittainy Simmons, PA-C   37.5 mg at 01/17/13 0031  . hydrocortisone cream 1 % 1 application  1 application Topical BID  Brittainy Simmons, PA-C   1 application at 01/17/13 1052  . hydrOXYzine (ATARAX/VISTARIL) tablet 10 mg  10 mg Oral TID PRN Robbie Lis, PA-C   10 mg at 01/17/13 1010  . isosorbide mononitrate (IMDUR) 24 hr tablet 90 mg  90 mg Oral Daily Brittainy Simmons, PA-C   90 mg at 01/17/13 1020  . latanoprost (XALATAN) 0.005 % ophthalmic solution 1 drop  1 drop Both Eyes QHS Brittainy Simmons, PA-C   1 drop at 01/17/13 0030  . multivitamin with minerals tablet 1 tablet  1 tablet Oral Daily Brittainy Simmons, PA-C   1 tablet at 01/17/13 1051  . nitroGLYCERIN (NITROSTAT) SL tablet 0.4 mg  0.4 mg Sublingual Q5 min PRN Brittainy Simmons, PA-C      . ondansetron (ZOFRAN) tablet 4 mg  4 mg Oral Q6H PRN Brittainy Simmons, PA-C       Or  . ondansetron (ZOFRAN) injection 4 mg  4 mg Intravenous Q6H PRN Brittainy Simmons, PA-C      . oxyCODONE (Oxy IR/ROXICODONE) immediate release tablet 5 mg  5 mg Oral Q4H PRN Brittainy Simmons, PA-C   5 mg at 01/17/13 0644  . potassium chloride (K-DUR) CR tablet 10 mEq  10 mEq Oral Daily Brittainy Simmons, PA-C   10 mEq at 01/17/13 1050  . ranolazine (RANEXA)  12 hr tablet 500 mg  500 mg Oral BID Brittainy Simmons, PA-C   500 mg at 01/17/13 1051  . sodium chloride 0.9 % injection 3 mL  3 mL Intravenous Q12H Brittainy Simmons, PA-C   3 mL at 01/17/13 1052  . triamcinolone 0.1 % cream : eucerin cream, 1:1 1 application  1 application Topical TID Robbie Lis, PA-C   1 application at 01/17/13 0057  . warfarin (COUMADIN) tablet 5 mg  5 mg Oral q1800 Brittainy Simmons, PA-C      . Warfarin - Physician Dosing Inpatient   Does not apply q1800 Marykay Lex, MD      . zolpidem [facility]) tablet 5 mg  5 mg Oral QHS PRN Robbie Lis, PA-C        PE: General appearance: alert, cooperative and no distress Lungs: clear to auscultation bilaterally Heart: regular rate and rhythm Extremities: + bilateral LEE Pulses: 2+ and symmetric Skin: extensive psoraiasis, no further  bleeding from bilateral feet Neurologic: Grossly normal  Lab Results:   Recent Labs  01/16/13 1242 01/17/13 0911  WBC 7.3 10.4  HGB 10.6* 9.2*  HCT 29.7* 26.3*  PLT 177 160   BMET  Recent Labs  01/16/13 1242 01/17/13 0911  NA 132* 130*  K 5.0 5.8*  CL 97 96  CO2 19 18*  GLUCOSE 87 137*  BUN 7 9  CREATININE 1.38* 2.14*  CALCIUM 8.8 8.5   PT/INR  Recent Labs  01/16/13 1242 01/17/13 0911  LABPROT 27.9* 33.0*  INR 2.77* 3.48*    Assessment/Plan  Principal Problem:   Acute on chronic systolic HF (heart failure) Active Problems:   Acute on chronic renal failure   Lower extremity pain   CAD (coronary artery disease)   HTN (hypertension)   Hyperlipidemia   Sinus tachycardia   Psoriasis  Plan: BNP was only mildly elevated yesterday ~800. Pt received one dose of IV lasix in ED and an additional dose this AM. Cannot assess diuresis b/c Is/Os not recorded. I would hold off on additional diuresis due to BP and renal function. Most recent SBP is in the 80s. He is asymptomatic. HR is better controlled and in the 80s. NSR on telemetry. SCr has increased from 1.38 to 2.14. He is not on an ACE-I.  Pt states that his leg pain and swelling seem improved. WOC consult was placed yesterday as well as PT/OT eval. Continue to watch BP today. Hold BB night dose if SBP <90. Will reassess in the AM and if BP and renal function stable, will restart PO Lasix.    LOS: 1 day    Brittainy M. Sharol Harness, PA-C 01/17/2013 12:12 PM  I have seen and examined the patient along with Brittainy M. Sharol Harness, PA-C.  I have reviewed the chart, notes and new data.  I agree with PA's note.  Key new complaints: feels well Key examination changes: BP low Key new findings / data: marked worsening of Na, creatinine after unclear amount of diuresis (no in/out records)  PLAN: I agree there is no room for additional unloading or diuresis at this point. Reduce hydralazine dose. Reevaluate renal function in  AM.  Thurmon Fair, MD, [facility] [phone] 01/17/2013, 1:42 PM

## 2013-01-17 NOTE — Progress Notes (Signed)
Agree with PT/OT cancellation note.  Rollins, Russell DPT [phone]

## 2013-01-18 ENCOUNTER — Inpatient Hospital Stay (HOSPITAL_COMMUNITY): Payer: Medicare Other

## 2013-01-18 DIAGNOSIS — N179 Acute kidney failure, unspecified: Secondary | ICD-10-CM

## 2013-01-18 DIAGNOSIS — E875 Hyperkalemia: Secondary | ICD-10-CM

## 2013-01-18 DIAGNOSIS — E8809 Other disorders of plasma-protein metabolism, not elsewhere classified: Secondary | ICD-10-CM | POA: Diagnosis present

## 2013-01-18 DIAGNOSIS — L089 Local infection of the skin and subcutaneous tissue, unspecified: Secondary | ICD-10-CM

## 2013-01-18 DIAGNOSIS — R000 Tachycardia, unspecified: Secondary | ICD-10-CM

## 2013-01-18 DIAGNOSIS — I96 Gangrene, not elsewhere classified: Secondary | ICD-10-CM | POA: Insufficient documentation

## 2013-01-18 DIAGNOSIS — F1020 Alcohol dependence, uncomplicated: Secondary | ICD-10-CM

## 2013-01-18 DIAGNOSIS — R7401 Elevation of levels of liver transaminase levels: Secondary | ICD-10-CM | POA: Diagnosis present

## 2013-01-18 DIAGNOSIS — N189 Chronic kidney disease, unspecified: Secondary | ICD-10-CM

## 2013-01-18 LAB — CBC
HCT: 25.7 % — ABNORMAL LOW (ref 39.0–52.0)
Hemoglobin: 8.9 g/dL — ABNORMAL LOW (ref 13.0–17.0)
MCH: 30 pg (ref 26.0–34.0)
MCHC: 34.6 g/dL (ref 30.0–36.0)
MCV: 86.5 fL (ref 78.0–100.0)
RBC: 2.97 MIL/uL — ABNORMAL LOW (ref 4.22–5.81)

## 2013-01-18 LAB — COMPREHENSIVE METABOLIC PANEL
ALT: 473 U/L — ABNORMAL HIGH (ref 0–53)
ALT: 445 U/L — ABNORMAL HIGH (ref 0–53)
AST: 2157 U/L — ABNORMAL HIGH (ref 0–37)
Albumin: 2.1 g/dL — ABNORMAL LOW (ref 3.5–5.2)
Alkaline Phosphatase: 598 U/L — ABNORMAL HIGH (ref 39–117)
BUN: 13 mg/dL (ref 6–23)
CO2: 15 mEq/L — ABNORMAL LOW (ref 19–32)
CO2: 22 mEq/L (ref 19–32)
Calcium: 8.2 mg/dL — ABNORMAL LOW (ref 8.4–10.5)
Chloride: 94 mEq/L — ABNORMAL LOW (ref 96–112)
Creatinine, Ser: 2.85 mg/dL — ABNORMAL HIGH (ref 0.50–1.35)
GFR calc Af Amer: 19 mL/min — ABNORMAL LOW (ref >90)
Glucose, Bld: 139 mg/dL — ABNORMAL HIGH (ref 70–99)
Potassium: 5.7 mEq/L — ABNORMAL HIGH (ref 3.5–5.1)
Sodium: 123 mEq/L — ABNORMAL LOW (ref 135–145)
Sodium: 128 mEq/L — ABNORMAL LOW (ref 135–145)
Total Bilirubin: 2 mg/dL — ABNORMAL HIGH (ref 0.3–1.2)
Total Protein: 6.3 g/dL (ref 6.0–8.3)
Total Protein: 6.6 g/dL (ref 6.0–8.3)

## 2013-01-18 LAB — PROTIME-INR: Prothrombin Time: 30.5 seconds — ABNORMAL HIGH (ref 11.6–15.2)

## 2013-01-18 MED ORDER — SODIUM POLYSTYRENE SULFONATE 15 GM/60ML PO SUSP
30.0000 g | Freq: Once | ORAL | Status: AC
  Administered 2013-01-18: 30 g via ORAL
  Filled 2013-01-18: qty 120

## 2013-01-18 MED ORDER — THIAMINE HCL 100 MG/ML IJ SOLN
100.0000 mg | Freq: Every day | INTRAMUSCULAR | Status: DC
  Filled 2013-01-18 (×7): qty 1

## 2013-01-18 MED ORDER — SODIUM POLYSTYRENE SULFONATE 15 GM/60ML PO SUSP
30.0000 g | Freq: Once | ORAL | Status: DC
  Filled 2013-01-18: qty 120

## 2013-01-18 MED ORDER — WARFARIN - PHARMACIST DOSING INPATIENT: Freq: Every day | Status: DC

## 2013-01-18 MED ORDER — FOLIC ACID 1 MG PO TABS
1.0000 mg | ORAL_TABLET | Freq: Every day | ORAL | Status: DC
  Administered 2013-01-18 – 2013-02-01 (×15): 1 mg via ORAL
  Filled 2013-01-18 (×15): qty 1

## 2013-01-18 MED ORDER — LORAZEPAM 0.5 MG PO TABS: 1.0000 mg | ORAL_TABLET | Freq: Four times a day (QID) | ORAL | Status: AC | PRN

## 2013-01-18 MED ORDER — SODIUM CHLORIDE 0.9 % IV SOLN
INTRAVENOUS | Status: DC
  Administered 2013-01-18: 12:00:00 via INTRAVENOUS

## 2013-01-18 MED ORDER — VITAMIN B-1 100 MG PO TABS
100.0000 mg | ORAL_TABLET | Freq: Every day | ORAL | Status: DC
  Administered 2013-01-18 – 2013-02-01 (×15): 100 mg via ORAL
  Filled 2013-01-18 (×15): qty 1

## 2013-01-18 MED ORDER — FUROSEMIDE 10 MG/ML IJ SOLN
40.0000 mg | Freq: Three times a day (TID) | INTRAMUSCULAR | Status: DC
  Administered 2013-01-18 – 2013-01-23 (×11): 40 mg via INTRAVENOUS
  Filled 2013-01-18 (×16): qty 4

## 2013-01-18 MED ORDER — ADULT MULTIVITAMIN W/MINERALS CH
1.0000 | ORAL_TABLET | Freq: Every day | ORAL | Status: DC
  Filled 2013-01-18: qty 1

## 2013-01-18 MED ORDER — CARVEDILOL 12.5 MG PO TABS
12.5000 mg | ORAL_TABLET | Freq: Once | ORAL | Status: AC
  Administered 2013-01-18: 12.5 mg via ORAL
  Filled 2013-01-18: qty 1

## 2013-01-18 MED ORDER — ENSURE COMPLETE PO LIQD
237.0000 mL | Freq: Two times a day (BID) | ORAL | Status: DC
  Administered 2013-01-18 – 2013-02-01 (×26): 237 mL via ORAL

## 2013-01-18 MED ORDER — LORAZEPAM 2 MG/ML IJ SOLN: 1.0000 mg | Freq: Four times a day (QID) | INTRAMUSCULAR | Status: AC | PRN

## 2013-01-18 MED ORDER — LACTULOSE 10 GM/15ML PO SOLN: 30.0000 g | Freq: Once | ORAL | Status: DC

## 2013-01-18 NOTE — Progress Notes (Addendum)
[facility]  DAILY PROGRESS NOTE   Subjective:  Hyperkalemia noted overnight. Diuretics stopped due to hypotension. Blood pressure has improved this morning. There are literally "sheets of skin" on the floor that he is pulling off of him. He reports he feels fine.  Objective:  Temp:  [98.1 F (36.7 C)-98.3 F (36.8 C)] 98.2 F (36.8 C) (05/18 0515) Pulse Rate:  [66-88] 88 (05/18 0515) Resp:  [18-20] 20 (05/18 0515) BP: (82-113)/(51-76) 113/74 mmHg (05/18 0928) SpO2:  [97 %-100 %] 99 % (05/18 0515) Weight:  [216 lb 11.4 oz (98.3 kg)] 216 lb 11.4 oz (98.3 kg) (05/18 0515) Weight change: -7.1 oz (-0.2 kg)  Intake/Output from previous day: 05/17 0701 - 05/18 0700 In: 1320 [P.O.:1320] Out: 100 [Urine:100]  Intake/Output from this shift: Total I/O In: 480 [P.O.:480] Out: -   Medications: Current Facility-Administered Medications  Medication Dose Route Frequency Provider Last Rate Last Dose  . 0.9 %  sodium chloride infusion   Intravenous Continuous Laray Anger, DO 50 mL/hr at 01/16/13 1305    . 0.9 %  sodium chloride infusion   Intravenous Continuous Chrystie Nose, MD      . aspirin EC tablet 81 mg  81 mg Oral Daily Brittainy Simmons, PA-C   81 mg at 01/18/13 0933  . carvedilol (COREG) tablet 25 mg  25 mg Oral BID WC Brittainy Simmons, PA-C   25 mg at 01/17/13 0643  . dorzolamide-timolol (COSOPT) 22.3-6.8 MG/ML ophthalmic solution 1 drop  1 drop Both Eyes QHS Brittainy Simmons, PA-C   1 drop at 01/17/13 2229  . feeding supplement (ENSURE COMPLETE) liquid 237 mL  237 mL Oral BID BM Chrystie Nose, MD      . hydrALAZINE (APRESOLINE) tablet 12.5 mg  12.5 mg Oral BID Mihai Croitoru, MD   12.5 mg at 01/18/13 0932  . hydrocortisone cream 1 % 1 application  1 application Topical BID Robbie Lis, PA-C   1 application at 01/18/13 0933  . hydrOXYzine (ATARAX/VISTARIL) tablet 10 mg  10 mg Oral TID PRN Robbie Lis, PA-C   10 mg at 01/17/13  2038  . isosorbide mononitrate (IMDUR) 24 hr tablet 90 mg  90 mg Oral Daily Brittainy Simmons, PA-C   90 mg at 01/18/13 0932  . latanoprost (XALATAN) 0.005 % ophthalmic solution 1 drop  1 drop Both Eyes QHS Brittainy Simmons, PA-C   1 drop at 01/17/13 2229  . multivitamin with minerals tablet 1 tablet  1 tablet Oral Daily Brittainy Simmons, PA-C   1 tablet at 01/18/13 0932  . nitroGLYCERIN (NITROSTAT) SL tablet 0.4 mg  0.4 mg Sublingual Q5 min PRN Brittainy Simmons, PA-C      . ondansetron (ZOFRAN) tablet 4 mg  4 mg Oral Q6H PRN Brittainy Simmons, PA-C       Or  . ondansetron (ZOFRAN) injection 4 mg  4 mg Intravenous Q6H PRN Brittainy Simmons, PA-C      . oxyCODONE (Oxy IR/ROXICODONE) immediate release tablet 5 mg  5 mg Oral Q4H PRN Brittainy Simmons, PA-C   5 mg at 01/18/13 0233  . sodium chloride 0.9 % injection 3 mL  3 mL Intravenous Q12H Brittainy Simmons, PA-C   3 mL at 01/18/13 0933  . sodium polystyrene (KAYEXALATE) 15 GM/60ML suspension 30 g  30 g Oral Once Chrystie Nose, MD      . triamcinolone 0.1 % cream : eucerin cream, 1:1 1 application  1 application Topical TID Robbie Lis, PA-C  1 application at 01/18/13 0933  . Warfarin - Physician Dosing Inpatient   Does not apply q1800 Marykay Lex, MD        Physical Exam: General appearance: alert and mild distress Neck: no adenopathy, no carotid bruit, no JVD, supple, symmetrical, trachea midline and thyroid not enlarged, symmetric, no tenderness/mass/nodules Lungs: clear to auscultation bilaterally Heart: regular rate and rhythm, S1, S2 normal, no murmur, click, rub or gallop Abdomen: tender, firm and distended, the liver is enlarged, there is no Murphy's sign Extremities: 1-2+ edema of the lower extremities, there is severe hyperkeratosis and sloughing of the skin all over the body Pulses: 2+ and symmetric Skin: severe skin sloughing, xerosis, hyperkeratosis Neurologic: Mental status: Alert, oriented, thought content  appropriate, there is right eye strabismus  Lab Results: Results for orders placed during the hospital encounter of 01/16/13 (from the past 48 hour(s))  PROTIME-INR     Status: Abnormal   Collection Time    01/16/13 12:42 PM      Result Value Range   Prothrombin Time 27.9 (*) 11.6 - 15.2 seconds   INR 2.77 (*) 0.00 - 1.49  BASIC METABOLIC PANEL     Status: Abnormal   Collection Time    01/16/13 12:42 PM      Result Value Range   Sodium 132 (*) 135 - 145 mEq/L   Potassium 5.0  3.5 - 5.1 mEq/L   Chloride 97  96 - 112 mEq/L   CO2 19  19 - 32 mEq/L   Glucose, Bld 87  70 - 99 mg/dL   BUN 7  6 - 23 mg/dL   Creatinine, Ser 4.13 (*) 0.50 - 1.35 mg/dL   Calcium 8.8  8.4 - 24.4 mg/dL   GFR calc non Af Amer 52 (*) >90 mL/min   GFR calc Af Amer 60 (*) >90 mL/min   Comment:            The eGFR has been calculated     using the CKD EPI equation.     This calculation has not been     validated in all clinical     situations.     eGFR's persistently     <90 mL/min signify     possible Chronic Kidney Disease.  CBC WITH DIFFERENTIAL     Status: Abnormal   Collection Time    01/16/13 12:42 PM      Result Value Range   WBC 7.3  4.0 - 10.5 K/uL   RBC 3.47 (*) 4.22 - 5.81 MIL/uL   Hemoglobin 10.6 (*) 13.0 - 17.0 g/dL   HCT 01.0 (*) 27.2 - 53.6 %   MCV 85.6  78.0 - 100.0 fL   MCH 30.5  26.0 - 34.0 pg   MCHC 35.7  30.0 - 36.0 g/dL   RDW 64.4 (*) 03.4 - 74.2 %   Platelets 177  150 - 400 K/uL   Neutrophils Relative % 71  43 - 77 %   Neutro Abs 5.2  1.7 - 7.7 K/uL   Lymphocytes Relative 11 (*) 12 - 46 %   Lymphs Abs 0.8  0.7 - 4.0 K/uL   Monocytes Relative 9  3 - 12 %   Monocytes Absolute 0.6  0.1 - 1.0 K/uL   Eosinophils Relative 9 (*) 0 - 5 %   Eosinophils Absolute 0.6  0.0 - 0.7 K/uL   Basophils Relative 0  0 - 1 %   Basophils Absolute 0.0  0.0 - 0.1 K/uL  PRO B NATRIURETIC PEPTIDE     Status: Abnormal   Collection Time    01/16/13 12:42 PM      Result Value Range   Pro B  Natriuretic peptide (BNP) 829.9 (*) 0 - 125 pg/mL  TROPONIN I     Status: None   Collection Time    01/16/13 12:42 PM      Result Value Range   Troponin I <0.30  <0.30 ng/mL   Comment:            Due to the release kinetics of cTnI,     a negative result within the first hours     of the onset of symptoms does not rule out     myocardial infarction with certainty.     If myocardial infarction is still suspected,     repeat the test at appropriate intervals.  URINALYSIS, ROUTINE W REFLEX MICROSCOPIC     Status: Abnormal   Collection Time    01/16/13  3:48 PM      Result Value Range   Color, Urine YELLOW  YELLOW   APPearance CLEAR  CLEAR   Specific Gravity, Urine 1.014  1.005 - 1.030   pH 5.0  5.0 - 8.0   Glucose, UA NEGATIVE  NEGATIVE mg/dL   Hgb urine dipstick NEGATIVE  NEGATIVE   Bilirubin Urine NEGATIVE  NEGATIVE   Ketones, ur NEGATIVE  NEGATIVE mg/dL   Protein, ur NEGATIVE  NEGATIVE mg/dL   Urobilinogen, UA 1.0  0.0 - 1.0 mg/dL   Nitrite NEGATIVE  NEGATIVE   Leukocytes, UA TRACE (*) NEGATIVE  URINE CULTURE     Status: None   Collection Time    01/16/13  3:48 PM      Result Value Range   Specimen Description URINE, CLEAN CATCH     Special Requests NONE     Culture  Setup Time 01/16/2013 16:18     Colony Count >=100,000 COLONIES/ML     Culture       Value: Multiple bacterial morphotypes present, none predominant. Suggest appropriate recollection if clinically indicated.   Report Status 01/17/2013 FINAL    URINE MICROSCOPIC-ADD ON     Status: Abnormal   Collection Time    01/16/13  3:48 PM      Result Value Range   WBC, UA 0-2  <3 WBC/hpf   RBC / HPF 0-2  <3 RBC/hpf   Bacteria, UA RARE  RARE   Casts HYALINE CASTS (*) NEGATIVE  COMPREHENSIVE METABOLIC PANEL     Status: Abnormal   Collection Time    01/17/13  9:11 AM      Result Value Range   Sodium 130 (*) 135 - 145 mEq/L   Potassium 5.8 (*) 3.5 - 5.1 mEq/L   Chloride 96  96 - 112 mEq/L   CO2 18 (*) 19 - 32 mEq/L    Glucose, Bld 137 (*) 70 - 99 mg/dL   BUN 9  6 - 23 mg/dL   Creatinine, Ser 1.61 (*) 0.50 - 1.35 mg/dL   Comment: DELTA CHECK NOTED   Calcium 8.5  8.4 - 10.5 mg/dL   Total Protein 6.0  6.0 - 8.3 g/dL   Albumin 2.0 (*) 3.5 - 5.2 g/dL   AST 0960 (*) 0 - 37 U/L   ALT 461 (*) 0 - 53 U/L   Alkaline Phosphatase 490 (*) 39 - 117 U/L   Total Bilirubin 2.3 (*) 0.3 - 1.2 mg/dL   GFR calc non Af Denyse Dago  31 (*) >90 mL/min   GFR calc Af Amer 36 (*) >90 mL/min   Comment:            The eGFR has been calculated     using the CKD EPI equation.     This calculation has not been     validated in all clinical     situations.     eGFR's persistently     <90 mL/min signify     possible Chronic Kidney Disease.  CBC     Status: Abnormal   Collection Time    01/17/13  9:11 AM      Result Value Range   WBC 10.4  4.0 - 10.5 K/uL   RBC 3.06 (*) 4.22 - 5.81 MIL/uL   Hemoglobin 9.2 (*) 13.0 - 17.0 g/dL   HCT 45.4 (*) 09.8 - 11.9 %   MCV 85.9  78.0 - 100.0 fL   MCH 30.1  26.0 - 34.0 pg   MCHC 35.0  30.0 - 36.0 g/dL   RDW 14.7 (*) 82.9 - 56.2 %   Platelets 160  150 - 400 K/uL  PROTIME-INR     Status: Abnormal   Collection Time    01/17/13  9:11 AM      Result Value Range   Prothrombin Time 33.0 (*) 11.6 - 15.2 seconds   INR 3.48 (*) 0.00 - 1.49  COMPREHENSIVE METABOLIC PANEL     Status: Abnormal   Collection Time    01/18/13  3:55 AM      Result Value Range   Sodium 123 (*) 135 - 145 mEq/L   Comment: DELTA CHECK NOTED   Potassium 6.4 (*) 3.5 - 5.1 mEq/L   Comment: NO VISIBLE HEMOLYSIS     CRITICAL RESULT CALLED TO, READ BACK BY AND VERIFIED WITH:     RASUL G,RN 01/18/13 0554 WAYK   Chloride 92 (*) 96 - 112 mEq/L   CO2 15 (*) 19 - 32 mEq/L   Glucose, Bld 125 (*) 70 - 99 mg/dL   BUN 11  6 - 23 mg/dL   Creatinine, Ser 1.30 (*) 0.50 - 1.35 mg/dL   Calcium 8.2 (*) 8.4 - 10.5 mg/dL   Total Protein 6.3  6.0 - 8.3 g/dL   Albumin 2.1 (*) 3.5 - 5.2 g/dL   AST 8657 (*) 0 - 37 U/L   ALT 473 (*) 0 -  53 U/L   Alkaline Phosphatase 535 (*) 39 - 117 U/L   Total Bilirubin 2.0 (*) 0.3 - 1.2 mg/dL   GFR calc non Af Amer 22 (*) >90 mL/min   GFR calc Af Amer 25 (*) >90 mL/min   Comment:            The eGFR has been calculated     using the CKD EPI equation.     This calculation has not been     validated in all clinical     situations.     eGFR's persistently     <90 mL/min signify     possible Chronic Kidney Disease.  CBC     Status: Abnormal   Collection Time    01/18/13  3:55 AM      Result Value Range   WBC 11.0 (*) 4.0 - 10.5 K/uL   RBC 2.97 (*) 4.22 - 5.81 MIL/uL   Hemoglobin 8.9 (*) 13.0 - 17.0 g/dL   HCT 84.6 (*) 96.2 - 95.2 %   MCV 86.5  78.0 - 100.0 fL  MCH 30.0  26.0 - 34.0 pg   MCHC 34.6  30.0 - 36.0 g/dL   RDW 16.1 (*) 09.6 - 04.5 %   Platelets 149 (*) 150 - 400 K/uL  PROTIME-INR     Status: Abnormal   Collection Time    01/18/13  3:55 AM      Result Value Range   Prothrombin Time 30.5 (*) 11.6 - 15.2 seconds   INR 3.13 (*) 0.00 - 1.49    Imaging: Dg Chest Port 1 View  01/16/2013   *RADIOLOGY REPORT*  Clinical Data: Shortness of breath and swelling.  History hypertension.  PORTABLE CHEST - 1 VIEW  Comparison: 11/29/2012  Findings: Prior median sternotomy.  Patient minimally rotated left. Numerous leads and wires project over the chest.  Remote bilateral rib fractures.  Borderline cardiomegaly.     No pleural effusion or pneumothorax. No congestive failure.  Clear lungs.  IMPRESSION: Borderline cardiomegaly, without acute disease.   Original Report Authenticated By: Jeronimo Greaves, [name]    Assessment:  Principal Problem:   Acute on chronic systolic HF (heart failure) Active Problems:   CAD (coronary artery disease)   HTN (hypertension)   Hyperlipidemia   Acute on chronic renal failure   Lower extremity pain   Sinus tachycardia   Psoriasis   Plan:  1. This is not heart failure. Creatinine is rising precipitously - now 2.85, up from 2.14 and 1.38 the  previous 2 days.  He is hyponatremic, hypochloremic and hyperkalemic today (6.4). AST and ALT are markedly elevated and he is hypoalbuminemic. Urine shows hyaline casts. I suspect he is intravascularly volume depleted, possibly from synthetic liver dysfunction (noting low albumin) and third spacing. I would favor gentle hydration today with 50 cc/hr normal saline. Discontinue liver toxic medications (lipitor, fenofibrate, tylenol) and possible renal toxic (ranexa) medications. Dose kayexalate today for hyperkalemia - potassium supplements are discontinued. Check ammonia - may need lactulose. Consult GI and nephrology today. Check right upper quadrant ultrasound today due to abnormal LFT's, elevated bilirubin, and elevated ALK phos - ?biliary obstruction - AST is >3X ALT, suggest possible alcoholic hepatitis (known etoh history).  Unclear what his skin condition is - ?advanced psoriasis - but with sloughing, is this Stevens-Johnson or TEN? Seems to be mostly epidermal, however.  No signs of shock - will need to monitor closely.   Time Spent Directly with Patient:  30 minutes  Length of Stay:  LOS: 2 days   Chrystie Nose, MD, [facility] Attending Cardiologist [facility]  Yoltzin Barg C 01/18/2013, 11:36 AM

## 2013-01-18 NOTE — Progress Notes (Signed)
PT Cancellation Note  Patient Details Name: Devin Williams MRN: [id number] DOB: 12-Aug-1946   Cancelled Treatment:    Reason Eval/Treat Not Completed: Patient's level of consciousness (Pt K+ currently 6.4.  ).  Also waiting for wound care consult per MD orders state no ambulation until after wound care consult.  Will attempt to see tomorrow.  Thanks!!   Zaleigh Bermingham 01/18/2013, 8:06 AM Jake Shark, PT DPT [phone]

## 2013-01-18 NOTE — Progress Notes (Signed)
ANTICOAGULATION CONSULT NOTE - Initial Consult  Pharmacy Consult for Warfarin Indication: VTE prophylaxis (Hx DVT)  Allergies  Allergen Reactions  . Penicillins     Patient Measurements: Height: 5\' 9"  (175.3 cm) Weight: 216 lb 11.4 oz (98.3 kg) IBW/kg (Calculated) : 70.7 Heparin Dosing Weight:   Vital Signs: Temp: 98.2 F (36.8 C) (05/18 0515) Temp src: Oral (05/18 0515) BP: 113/74 mmHg (05/18 0928) Pulse Rate: 88 (05/18 0515)  Labs:  Recent Labs  01/16/13 1242 01/17/13 0911 01/18/13 0355  HGB 10.6* 9.2* 8.9*  HCT 29.7* 26.3* 25.7*  PLT 177 160 149*  LABPROT 27.9* 33.0* 30.5*  INR 2.77* 3.48* 3.13*  CREATININE 1.38* 2.14* 2.85*  TROPONINI <0.30  --   --     Estimated Creatinine Clearance: 29.9 ml/min (by C-G formula based on Cr of 2.85).   Medical History: Past Medical History  Diagnosis Date  . Hypertension   . DVT (deep venous thrombosis) 11/2012    bilateral  . Psoriasis   . Chronic kidney disease     CKD 3  . Coronary artery disease 01/16/2011    R/P MV - some attenuation artiface in inferior region, no ischemia or infarct/scar seen in remaining myocardium; notwithstanding pts hx of CAD and CABG findings are more consistent w. non-ischemic cardiomyopathy; balanced ischemia cannot be ruled out;  no significant change from previous study 11/2008  . Hyperlipidemia   . Alcohol abuse, daily use   . Diastolic dysfunction   . GERD (gastroesophageal reflux disease)   . Optic nerve ischemia   . Problems with hearing   . Vision problems   . SOB (shortness of breath) 11/18/2012    Echo at [facility]   . Pain in limb 11/29/2012    LE venous duplex done at [facility]  . Carotid bruit 03/20/2007    Carotid doppler - R & L bulbs and proximal ICA -  irregular nonhemodynamically significant plaque of 0-49% diameter reduction  . Edema   . Hypoalbuminemia   . Sinus tachycardia   . Cardiac angina   . Hx-TIA (transient ischemic attack)   . Alcohol abuse     Assessment: 67 year old  male admitted with increasing Scr, LFTs, and electrolyte abnormalities. On Coumadin prior to admission for history of DVT. Pharmacy is now consulted to dose Coumadin. Home dose is 5mg  daily, with last dose taken on 5/16. Admit INR was 2.77 on 5/16 and increased up to 3.48 yesterday without any doses of Coumadin for that day. INR is back down but still supratherapeutic at 3.13 today.   Goal of Therapy:  INR 2-3 Monitor platelets by anticoagulation protocol: Yes   Plan:  - No Coumadin tonight - Daily INR - Monitor for bleeding   Thank you, Franchot Erichsen, Pharm.D. Clinical Pharmacist   Pager: [phone] 01/18/2013 12:42 PM

## 2013-01-18 NOTE — Progress Notes (Signed)
Pt's potassium is 6.4, Dr. Katha Cabal was notified and ordered to discontinue scheduled potassium. MD was also informed about pt's BP=106/76 and is on 25mg  coreg scheduled at 0700, MD ordered to just give 12.5mg  coreg once. Will continue to monitor.

## 2013-01-18 NOTE — Progress Notes (Signed)
Pt's creatinine is 3.56 @ 3:43 pm, pt is on lasix 40mg  IV TID Dr Gemma Payor notified and ordered via phone  to hold lasix x 2 doses and evaluate in the morning.

## 2013-01-18 NOTE — Consult Note (Signed)
[facility] Renal Consultation Note  Requesting MD: Hilty Indication for Consultation: Labs that were resulted at [facility] when consult was called at 2 PM- ATN and AKI  HPI:  Devin Williams is a 67 y.o. male with note of previous hypertension, coronary artery disease status post CABG  in 29, with recent evidence of worsening CAD, decreased ejection fraction in the 40s as well as aortic sclerosis, alcohol abuse with decreased albumin is noted in the past no official liver diagnosis and psoriasis. He also appears to have CKD. Back in 2010 there was a creatinine of 1.6. He also has several labs from March and April which seemed to range from 1.4 all the way up to 3. Earlier this year, he was diagnosed with bilateral lower extremity DVTs with no evidence of PE. He said since that he's been having issues with lower extremity swelling. He presented on 5/16 with an 8 pound weight gain in 2 days with shortness of breath and chest pain.  Really no urine output has been noted since admission but he was reportedly placed on Lasix in addition to Corag and hydralazine. Yesterday starting at 10 AM until approximately 2 PM his systolic blood pressure was in the 80s.  His BP has since come up. Patient denies dizziness. He does admit that he has not been making urine.  He still appears to be edematous. His creatinine has worsened precipitously in the setting of above. His diuretics were discontinued but his Corag and hydralazine were continued.  He was also started on IV fluids.  He also had a potassium of 6.4 but again was resulted at 5 AM. I am called at 2 PM to assist manage with this. He was given 30 g of Kayexalate and it was to be rechecked this p.m. Also of note, his LFTs have increased, query shock liver versus some type of primary liver issue. Fortunately, patient does not appear to be uremic at this time. Urinalysis is negative. Renal ultrasound is pending. He did have a renal ultrasound done in March  which revealed fairly normal  kidneys with echogenic liver.   Creatinine, Ser  Date/Time Value Range Status  01/18/2013  3:55 AM 2.85* 0.50 - 1.35 mg/dL Final  12/10/8117  1:47 AM 2.14* 0.50 - 1.35 mg/dL Final     DELTA CHECK NOTED  01/16/2013 12:42 PM 1.38* 0.50 - 1.35 mg/dL Final  04/04/9561  1:30 AM 2.01* 0.50 - 1.35 mg/dL Final  8/65/7846  9:62 AM 1.99* 0.50 - 1.35 mg/dL Final  9/52/8413  2:44 AM 1.47* 0.50 - 1.35 mg/dL Final  0/06/2724  3:66 PM 1.40* 0.50 - 1.35 mg/dL Final  4/40/3474  2:59 AM 1.87* 0.50 - 1.35 mg/dL Final  5/63/8756  4:33 AM 2.11* 0.50 - 1.35 mg/dL Final  2/95/1884  1:66 AM 2.54* 0.50 - 1.35 mg/dL Final  0/63/0160  1:09 AM 2.97* 0.50 - 1.35 mg/dL Final  11/24/5571  2:20 AM 3.11* 0.50 - 1.35 mg/dL Final  2/54/2706  2:37 AM 2.82* 0.50 - 1.35 mg/dL Final  02/28/3150  7:61 PM 2.57* 0.50 - 1.35 mg/dL Final  02/07/3709  6:26 AM 2.40* 0.50 - 1.35 mg/dL Final  9/48/5462 70:35 AM 2.39* 0.50 - 1.35 mg/dL Final  0/05/3817  2:99 PM 2.74* 0.50 - 1.35 mg/dL Final  3/71/6967  8:93 PM 2.76* 0.50 - 1.35 mg/dL Final  04/12/1750  0:25 PM 3.10* 0.50 - 1.35 mg/dL Final  85/27/7824  2:35 PM 1.65* 0.4 - 1.5 mg/dL Final  PMHx:   Past Medical History  Diagnosis Date  . Hypertension   . DVT (deep venous thrombosis) 11/2012    bilateral  . Psoriasis   . Chronic kidney disease     CKD 3  . Coronary artery disease 01/16/2011    R/P MV - some attenuation artiface in inferior region, no ischemia or infarct/scar seen in remaining myocardium; notwithstanding pts hx of CAD and CABG findings are more consistent w. non-ischemic cardiomyopathy; balanced ischemia cannot be ruled out;  no significant change from previous study 11/2008  . Hyperlipidemia   . Alcohol abuse, daily use   . Diastolic dysfunction   . GERD (gastroesophageal reflux disease)   . Optic nerve ischemia   . Problems with hearing   . Vision problems   . SOB (shortness of breath) 11/18/2012    Echo at [facility]   . Pain in limb  11/29/2012    LE venous duplex done at [facility]  . Carotid bruit 03/20/2007    Carotid doppler - R & L bulbs and proximal ICA -  irregular nonhemodynamically significant plaque of 0-49% diameter reduction  . Edema   . Hypoalbuminemia   . Sinus tachycardia   . Cardiac angina   . Hx-TIA (transient ischemic attack)   . Alcohol abuse     Past Surgical History  Procedure Laterality Date  . Cardiac surgery    . Knee surgery    . Appendectomy    . 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 Cardiac catheterization  02/14/2007    saphenous veingraft to RCA, saphenous vein graft to D1 and saphenous vein graft to ramus are occluded; native RCA is occluded and collateralized from L system; L main is severly diffusely diffuse; LAD is occluded after origin of a small diagonal 1; circumflex is occluded after origin of small AV groove branch circumflex    Family Hx:  Family History  Problem Relation Age of Onset  . Diabetes Mellitus II Father   . Psoriasis Father   . Scleroderma Mother   . Blindness Maternal Grandfather     Social History:  reports that he quit smoking about 12 years ago. His smoking use included Cigarettes. He smoked 0.50 packs per day. He has never used smokeless tobacco. He reports that he drinks about 24.0 ounces of alcohol per week. He reports that he does not use illicit drugs.  Allergies:  Allergies  Allergen Reactions  . Penicillins     Medications: Prior to Admission medications   Medication Sig Start Date End Date Taking? Authorizing Provider  acitretin (SORIATANE) 25 MG capsule Take 25 mg by mouth daily before breakfast.   Yes Historical Provider, MD  aspirin EC 81 MG tablet Take 81 mg by mouth daily.   Yes Historical Provider, MD  atorvastatin (LIPITOR) 20 MG tablet Take 20 mg by mouth at bedtime.   Yes Historical Provider, MD  carvedilol (COREG) 25 MG tablet Take 1 tablet (25 mg total) by mouth 2 (two)  times daily with a meal. 12/02/12  Yes Rodolph Bong, MD  Choline Fenofibrate (FENOFIBRIC ACID) 135 MG CPDR Take 1 capsule by mouth daily.   Yes Historical Provider, MD  dorzolamide-timolol (COSOPT) 22.3-6.8 MG/ML ophthalmic solution Place 1 drop into both eyes at bedtime.   Yes Historical Provider, MD  fenofibrate 160 MG tablet Take 160 mg by mouth daily.   Yes Historical Provider, MD  furosemide (LASIX) 20 MG tablet Take 20 mg  by mouth daily.   Yes Historical Provider, MD  hydrocortisone cream 0.5 % Apply 1 application topically 2 (two) times daily.   Yes Historical Provider, MD  hydrOXYzine (ATARAX/VISTARIL) 10 MG tablet Take 10 mg by mouth 3 (three) times daily as needed for itching.   Yes Historical Provider, MD  isosorbide mononitrate (IMDUR) 60 MG 24 hr tablet Take 60 mg by mouth daily.   Yes Historical Provider, MD  latanoprost (XALATAN) 0.005 % ophthalmic solution 1 drop at bedtime.   Yes Historical Provider, MD  Multiple Vitamin (MULTIVITAMIN WITH MINERALS) TABS Take 1 tablet by mouth daily.   Yes Historical Provider, MD  nitroGLYCERIN (NITROSTAT) 0.4 MG SL tablet Place 1 tablet (0.4 mg total) under the tongue every 5 (five) minutes as needed for chest pain (hold for SBP < 110). 12/02/12  Yes Rodolph Bong, MD  oxyCODONE (OXY IR/ROXICODONE) 5 MG immediate release tablet Take 1 tablet (5 mg total) by mouth every 4 (four) hours as needed. 12/02/12  Yes Rodolph Bong, MD  potassium chloride (K-DUR) 10 MEQ tablet Take 10 mEq by mouth daily.   Yes Historical Provider, MD  Triamcinolone Acetonide (TRIAMCINOLONE 0.1 % CREAM : EUCERIN) CREA Apply 1 application topically 3 (three) times daily. 12/02/12  Yes Rodolph Bong, MD  warfarin (COUMADIN) 5 MG tablet Take 5 mg by mouth daily.   Yes Historical Provider, MD    I have reviewed the patient's current medications.  Labs:  Results for orders placed during the hospital encounter of 01/16/13 (from the past 48 hour(s))  URINALYSIS, ROUTINE  W REFLEX MICROSCOPIC     Status: Abnormal   Collection Time    01/16/13  3:48 PM      Result Value Range   Color, Urine YELLOW  YELLOW   APPearance CLEAR  CLEAR   Specific Gravity, Urine 1.014  1.005 - 1.030   pH 5.0  5.0 - 8.0   Glucose, UA NEGATIVE  NEGATIVE mg/dL   Hgb urine dipstick NEGATIVE  NEGATIVE   Bilirubin Urine NEGATIVE  NEGATIVE   Ketones, ur NEGATIVE  NEGATIVE mg/dL   Protein, ur NEGATIVE  NEGATIVE mg/dL   Urobilinogen, UA 1.0  0.0 - 1.0 mg/dL   Nitrite NEGATIVE  NEGATIVE   Leukocytes, UA TRACE (*) NEGATIVE  URINE CULTURE     Status: None   Collection Time    01/16/13  3:48 PM      Result Value Range   Specimen Description URINE, CLEAN CATCH     Special Requests NONE     Culture  Setup Time 01/16/2013 16:18     Colony Count >=100,000 COLONIES/ML     Culture       Value: Multiple bacterial morphotypes present, none predominant. Suggest appropriate recollection if clinically indicated.   Report Status 01/17/2013 FINAL    URINE MICROSCOPIC-ADD ON     Status: Abnormal   Collection Time    01/16/13  3:48 PM      Result Value Range   WBC, UA 0-2  <3 WBC/hpf   RBC / HPF 0-2  <3 RBC/hpf   Bacteria, UA RARE  RARE   Casts HYALINE CASTS (*) NEGATIVE  COMPREHENSIVE METABOLIC PANEL     Status: Abnormal   Collection Time    01/17/13  9:11 AM      Result Value Range   Sodium 130 (*) 135 - 145 mEq/L   Potassium 5.8 (*) 3.5 - 5.1 mEq/L   Chloride 96  96 - 112 mEq/L  CO2 18 (*) 19 - 32 mEq/L   Glucose, Bld 137 (*) 70 - 99 mg/dL   BUN 9  6 - 23 mg/dL   Creatinine, Ser 0.86 (*) 0.50 - 1.35 mg/dL   Comment: DELTA CHECK NOTED   Calcium 8.5  8.4 - 10.5 mg/dL   Total Protein 6.0  6.0 - 8.3 g/dL   Albumin 2.0 (*) 3.5 - 5.2 g/dL   AST 5784 (*) 0 - 37 U/L   ALT 461 (*) 0 - 53 U/L   Alkaline Phosphatase 490 (*) 39 - 117 U/L   Total Bilirubin 2.3 (*) 0.3 - 1.2 mg/dL   GFR calc non Af Amer 31 (*) >90 mL/min   GFR calc Af Amer 36 (*) >90 mL/min   Comment:            The eGFR  has been calculated     using the CKD EPI equation.     This calculation has not been     validated in all clinical     situations.     eGFR's persistently     <90 mL/min signify     possible Chronic Kidney Disease.  CBC     Status: Abnormal   Collection Time    01/17/13  9:11 AM      Result Value Range   WBC 10.4  4.0 - 10.5 K/uL   RBC 3.06 (*) 4.22 - 5.81 MIL/uL   Hemoglobin 9.2 (*) 13.0 - 17.0 g/dL   HCT 69.6 (*) 29.5 - 28.4 %   MCV 85.9  78.0 - 100.0 fL   MCH 30.1  26.0 - 34.0 pg   MCHC 35.0  30.0 - 36.0 g/dL   RDW 13.2 (*) 44.0 - 10.2 %   Platelets 160  150 - 400 K/uL  PROTIME-INR     Status: Abnormal   Collection Time    01/17/13  9:11 AM      Result Value Range   Prothrombin Time 33.0 (*) 11.6 - 15.2 seconds   INR 3.48 (*) 0.00 - 1.49  COMPREHENSIVE METABOLIC PANEL     Status: Abnormal   Collection Time    01/18/13  3:55 AM      Result Value Range   Sodium 123 (*) 135 - 145 mEq/L   Comment: DELTA CHECK NOTED   Potassium 6.4 (*) 3.5 - 5.1 mEq/L   Comment: NO VISIBLE HEMOLYSIS     CRITICAL RESULT CALLED TO, READ BACK BY AND VERIFIED WITH:     RASUL G,RN 01/18/13 0554 WAYK   Chloride 92 (*) 96 - 112 mEq/L   CO2 15 (*) 19 - 32 mEq/L   Glucose, Bld 125 (*) 70 - 99 mg/dL   BUN 11  6 - 23 mg/dL   Creatinine, Ser 7.25 (*) 0.50 - 1.35 mg/dL   Calcium 8.2 (*) 8.4 - 10.5 mg/dL   Total Protein 6.3  6.0 - 8.3 g/dL   Albumin 2.1 (*) 3.5 - 5.2 g/dL   AST 3664 (*) 0 - 37 U/L   ALT 473 (*) 0 - 53 U/L   Alkaline Phosphatase 535 (*) 39 - 117 U/L   Total Bilirubin 2.0 (*) 0.3 - 1.2 mg/dL   GFR calc non Af Amer 22 (*) >90 mL/min   GFR calc Af Amer 25 (*) >90 mL/min   Comment:            The eGFR has been calculated     using the CKD EPI equation.  This calculation has not been     validated in all clinical     situations.     eGFR's persistently     <90 mL/min signify     possible Chronic Kidney Disease.  CBC     Status: Abnormal   Collection Time    01/18/13  3:55  AM      Result Value Range   WBC 11.0 (*) 4.0 - 10.5 K/uL   RBC 2.97 (*) 4.22 - 5.81 MIL/uL   Hemoglobin 8.9 (*) 13.0 - 17.0 g/dL   HCT 16.1 (*) 09.6 - 04.5 %   MCV 86.5  78.0 - 100.0 fL   MCH 30.0  26.0 - 34.0 pg   MCHC 34.6  30.0 - 36.0 g/dL   RDW 40.9 (*) 81.1 - 91.4 %   Platelets 149 (*) 150 - 400 K/uL  PROTIME-INR     Status: Abnormal   Collection Time    01/18/13  3:55 AM      Result Value Range   Prothrombin Time 30.5 (*) 11.6 - 15.2 seconds   INR 3.13 (*) 0.00 - 1.49     ROS: Patient's major complaint was of lower extremity edema. He does feel like it's better than when he came in. He states that he has occasional nausea but no vomiting. He admits his urine output is down. He denies dysuria hematuria or excessive foaminess to the urine. He denies current chest pain. He does have shortness breath and dyspnea on exertion. The remainder of the review systems is negative.    Physical Exam: Filed Vitals:   01/18/13 0928  BP: 113/74  Pulse:   Temp:   Resp:      General: Chronically ill appearing black male. He has widespread psoriasis and skin is sloughing. He is in no acute distress.  HEENT:  Pupils are equally round and reactive. His membranes are moist.  Neck:  Jugular venous distention is difficult to ascertain. There is no thyromegaly.  Heart: regular rate and rhythm without murmur, gallop, or rub.  Lungs: decreased breath sounds at bases. He's not hypoxic.  Abdomen: positive bowel sounds, distended. Nontender. I cannot tell if ascites is present.  Extremities: still with dependent pitting edema  Skin: warm and dry  Neuro: alert. Moving all 4 extremities. Able to answer simple questions.   Assessment/Plan: 67 year old black male with long history of cardiac issues and possible liver issue. He presented with volume overload. During the course of hospitalization had severe hypotension and now has what appears to be ATN. He has hyperkalemia and hyponatremia.  1.Renal-   initial creatinine is 1.38. It may have been falsely low based on his generous volume status. It has been much higher in the past. He now has AKI. Most notable thing was a drop in blood pressure to the 80s for several hours yesterday. I believe that this is the etiology of his AKI. His urinalysis is bland. His renal ultrasound is pending. I still believe that he is volume overloaded. There are no absolute indications for dialysis at this time. However I'm concerned that he appears to be an anuric.  This in addition to the hyperkalemia may force our hand to do something sooner rather than later. 2. Hypertension/volume  - is volume overloaded but hypotensive. I put parameters on his Coreg and hydralazine. I also resumed Lasix and put a softer parameter on it in order to try to induce some urine output. 3.  Hyponatremia   - hypervolemic hyponatremia. I  will check a urine sodium to look at hepatorenal syndrome. I will not check urine osms because I am putting him on Lasix. Hopefully will start to make urine and that will help sodium level.  4. Hyperkalemia- has  worsened precipitously. He was previously on potassium supplements of those been discontinued. He was given a dose of Kayexalate this morning and a repeat potassium is pending. As above, this hyperkalemia in addition to an area may force our hand sooner into renal replacement therapy. Will order a low potassium diet 5. patient is essentially refusing a Foley catheter at this time. Renal ultrasound does not show any evidence of hydro will continue with him voiding and we'll try to capture true ins and outs.  Darrold Bezek A 01/18/2013, 2:11 PM

## 2013-01-18 NOTE — Progress Notes (Signed)
CRITICAL VALUE ALERT  Critical value received:  Potassium=6.4  Date of notification: 01/18/13  Time of notification:  0554  Critical value read back:yes  Nurse who received alert:  G. Gae Bihl  MD notified (1st page):  Dr. Katha Cabal  Time of first page:  0610  MD notified (2nd page):  Time of second page:  Responding MD:  Dr. Katha Cabal  Time MD responded:  [phone]

## 2013-01-19 ENCOUNTER — Ambulatory Visit: Payer: Medicare Other | Admitting: Cardiology

## 2013-01-19 DIAGNOSIS — I5023 Acute on chronic systolic (congestive) heart failure: Secondary | ICD-10-CM

## 2013-01-19 DIAGNOSIS — I255 Ischemic cardiomyopathy: Secondary | ICD-10-CM

## 2013-01-19 DIAGNOSIS — Z7901 Long term (current) use of anticoagulants: Secondary | ICD-10-CM

## 2013-01-19 DIAGNOSIS — D649 Anemia, unspecified: Secondary | ICD-10-CM | POA: Diagnosis not present

## 2013-01-19 DIAGNOSIS — I2510 Atherosclerotic heart disease of native coronary artery without angina pectoris: Secondary | ICD-10-CM

## 2013-01-19 HISTORY — DX: Ischemic cardiomyopathy: I25.5

## 2013-01-19 LAB — HEMOGLOBIN AND HEMATOCRIT, BLOOD
HCT: 21.3 % — ABNORMAL LOW (ref 39.0–52.0)
Hemoglobin: 7.5 g/dL — ABNORMAL LOW (ref 13.0–17.0)

## 2013-01-19 LAB — COMPREHENSIVE METABOLIC PANEL
AST: 830 U/L — ABNORMAL HIGH (ref 0–37)
Alkaline Phosphatase: 548 U/L — ABNORMAL HIGH (ref 39–117)
BUN: 17 mg/dL (ref 6–23)
CO2: 21 mEq/L (ref 19–32)
Chloride: 95 mEq/L — ABNORMAL LOW (ref 96–112)
Creatinine, Ser: 3.83 mg/dL — ABNORMAL HIGH (ref 0.50–1.35)
GFR calc non Af Amer: 15 mL/min — ABNORMAL LOW (ref >90)
Total Bilirubin: 1.2 mg/dL (ref 0.3–1.2)

## 2013-01-19 LAB — CBC
MCH: 29.6 pg (ref 26.0–34.0)
Platelets: 186 10*3/uL (ref 150–400)
RBC: 2.6 MIL/uL — ABNORMAL LOW (ref 4.22–5.81)
RDW: 16.7 % — ABNORMAL HIGH (ref 11.5–15.5)
WBC: 7.5 10*3/uL (ref 4.0–10.5)

## 2013-01-19 LAB — IRON AND TIBC
Iron: 67 ug/dL (ref 42–135)
Saturation Ratios: 60 % — ABNORMAL HIGH (ref 20–55)
TIBC: 111 ug/dL — ABNORMAL LOW (ref 215–435)
UIBC: 44 ug/dL — ABNORMAL LOW (ref 125–400)

## 2013-01-19 LAB — PROTIME-INR: Prothrombin Time: 24.7 seconds — ABNORMAL HIGH (ref 11.6–15.2)

## 2013-01-19 LAB — PREPARE RBC (CROSSMATCH)

## 2013-01-19 LAB — ABO/RH: ABO/RH(D): O POS

## 2013-01-19 MED ORDER — ALBUMIN HUMAN 25 % IV SOLN
25.0000 g | Freq: Once | INTRAVENOUS | Status: AC
  Administered 2013-01-19: 25 g via INTRAVENOUS
  Filled 2013-01-19: qty 100

## 2013-01-19 MED ORDER — WARFARIN SODIUM 2 MG PO TABS
2.0000 mg | ORAL_TABLET | Freq: Once | ORAL | Status: DC
  Filled 2013-01-19: qty 1

## 2013-01-19 MED ORDER — FUROSEMIDE 10 MG/ML IJ SOLN
40.0000 mg | Freq: Once | INTRAMUSCULAR | Status: AC
  Administered 2013-01-19: 40 mg via INTRAVENOUS

## 2013-01-19 NOTE — Progress Notes (Signed)
ANTICOAGULATION CONSULT NOTE - Initial Consult  Pharmacy Consult for Warfarin Indication: VTE prophylaxis (Hx DVT)  Allergies  Allergen Reactions  . Penicillins     Patient Measurements: Height: 5\' 9"  (175.3 cm) Weight: 219 lb 12.8 oz (99.7 kg) IBW/kg (Calculated) : 70.7 Heparin Dosing Weight:   Vital Signs: Temp: 98.2 F (36.8 C) (05/19 0500) Temp src: Oral (05/19 0500) BP: 111/76 mmHg (05/19 0500) Pulse Rate: 89 (05/19 0500)  Labs:  Recent Labs  01/16/13 1242 01/17/13 0911 01/18/13 0355 01/18/13 1431 01/19/13 0500  HGB 10.6* 9.2* 8.9*  --  7.7*  HCT 29.7* 26.3* 25.7*  --  22.9*  PLT 177 160 149*  --  186  LABPROT 27.9* 33.0* 30.5*  --  24.7*  INR 2.77* 3.48* 3.13*  --  2.35*  CREATININE 1.38* 2.14* 2.85* 3.56* 3.83*  TROPONINI <0.30  --   --   --   --     Estimated Creatinine Clearance: 22.4 ml/min (by C-G formula based on Cr of 3.83).   Assessment: 67 year old male admitted with increasing Scr, LFTs, and electrolyte abnormalities. On Coumadin prior to admission for history of DVT. Pharmacy is now consulted to dose Coumadin. Home dose is 5mg  daily, with last dose taken on 5/16.   INR today is 2.35 with trend down and LFTs noted at 830/308. Hg noted at 7.7 with trend down and noted with volume overload.  Goal of Therapy:  INR 2-3 Monitor platelets by anticoagulation protocol: Yes   Plan:  - Coumadin 2mg  today - Daily INR - Monitor for bleeding   Harland German, Pharm D 01/19/2013 10:01 AM

## 2013-01-19 NOTE — Consult Note (Signed)
UNASSIGNED PATIENT. Reason for Consult: Abnormal LFT's and drop in hemoglobin. Referring Physician: Dr. Herbie Baltimore.  Devin Williams is an 67 y.o. male.  HPI: 67 year old black male with multiple medical issues listed below, presented with shortness of breath and swelling of his legs. He has not been taking his medications like he was supposed to and ran out of some of his other medication. He denies having any nausea, vomiting, abdominal pain, melena or hematochezia. He denies having any diarrhea or constipation. He claims he has a good appetite and his weight has been gradually increasing due to the fluid retention. There is no known history of colon cancer or stomach cancer. He has occasional problems with acid reflux.  Past Medical History  Diagnosis Date  . Hypertension   . DVT (deep venous thrombosis) 11/2012    bilateral  . Psoriasis   . Chronic kidney disease     CKD 3  . Coronary artery disease 01/16/2011    R/P MV - some attenuation artiface in inferior region, no ischemia or infarct/scar seen in remaining myocardium; notwithstanding pts hx of CAD and CABG findings are more consistent w. non-ischemic cardiomyopathy; balanced ischemia cannot be ruled out;  no significant change from previous study 11/2008  . Hyperlipidemia   . Alcohol abuse, daily use   . Diastolic dysfunction   . GERD (gastroesophageal reflux disease)   . Optic nerve ischemia   . Problems with hearing   . Vision problems   . SOB (shortness of breath) 11/18/2012    Echo at [facility]   . Pain in limb 11/29/2012    LE venous duplex done at [facility]  . Carotid bruit 03/20/2007    Carotid doppler - R & L bulbs and proximal ICA -  irregular nonhemodynamically significant plaque of 0-49% diameter reduction  . Edema   . Hypoalbuminemia   . Sinus tachycardia   . Cardiac angina   . Hx-TIA (transient ischemic attack)   . Alcohol abuse    Past Surgical History  Procedure Laterality Date  . Cardiac surgery    . Knee surgery    .  Appendectomy    . 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 Cardiac catheterization  02/14/2007    saphenous veingraft to RCA, saphenous vein graft to D1 and saphenous vein graft to ramus are occluded; native RCA is occluded and collateralized from L system; L main is severly diffusely diffuse; LAD is occluded after origin of a small diagonal 1; circumflex is occluded after origin of small AV groove branch circumflex   Family History  Problem Relation Age of Onset  . Diabetes Mellitus II Father   . Psoriasis Father   . Scleroderma Mother   . Blindness Maternal Grandfather     Social History:  reports that he quit smoking about 12 years ago. His smoking use included Cigarettes. He smoked 0.50 packs per day. He has never used smokeless tobacco. He reports that he drinks about 24.0 ounces of alcohol per week. He reports that he does not use illicit drugs.  Allergies:  Allergies  Allergen Reactions  . Penicillins    Medications: I have reviewed the patient's current medications.  Results for orders placed during the hospital encounter of 01/16/13 (from the past 48 hour(s))  COMPREHENSIVE METABOLIC PANEL     Status: Abnormal   Collection Time    01/18/13  3:55 AM      Result Value Range  Sodium 123 (*) 135 - 145 mEq/L   Comment: DELTA CHECK NOTED   Potassium 6.4 (*) 3.5 - 5.1 mEq/L   Comment: NO VISIBLE HEMOLYSIS     CRITICAL RESULT CALLED TO, READ BACK BY AND VERIFIED WITH:     RASUL G,RN 01/18/13 0554 WAYK   Chloride 92 (*) 96 - 112 mEq/L   CO2 15 (*) 19 - 32 mEq/L   Glucose, Bld 125 (*) 70 - 99 mg/dL   BUN 11  6 - 23 mg/dL   Creatinine, Ser 1.61 (*) 0.50 - 1.35 mg/dL   Calcium 8.2 (*) 8.4 - 10.5 mg/dL   Total Protein 6.3  6.0 - 8.3 g/dL   Albumin 2.1 (*) 3.5 - 5.2 g/dL   AST 0960 (*) 0 - 37 U/L   ALT 473 (*) 0 - 53 U/L   Alkaline Phosphatase 535 (*) 39 - 117 U/L   Total Bilirubin 2.0 (*) 0.3 - 1.2 mg/dL    GFR calc non Af Amer 22 (*) >90 mL/min   GFR calc Af Amer 25 (*) >90 mL/min   Comment:            The eGFR has been calculated     using the CKD EPI equation.     This calculation has not been     validated in all clinical     situations.     eGFR's persistently     <90 mL/min signify     possible Chronic Kidney Disease.  CBC     Status: Abnormal   Collection Time    01/18/13  3:55 AM      Result Value Range   WBC 11.0 (*) 4.0 - 10.5 K/uL   RBC 2.97 (*) 4.22 - 5.81 MIL/uL   Hemoglobin 8.9 (*) 13.0 - 17.0 g/dL   HCT 45.4 (*) 09.8 - 11.9 %   MCV 86.5  78.0 - 100.0 fL   MCH 30.0  26.0 - 34.0 pg   MCHC 34.6  30.0 - 36.0 g/dL   RDW 14.7 (*) 82.9 - 56.2 %   Platelets 149 (*) 150 - 400 K/uL  PROTIME-INR     Status: Abnormal   Collection Time    01/18/13  3:55 AM      Result Value Range   Prothrombin Time 30.5 (*) 11.6 - 15.2 seconds   INR 3.13 (*) 0.00 - 1.49  AMMONIA     Status: None   Collection Time    01/18/13  2:31 PM      Result Value Range   Ammonia 38  11 - 60 umol/L  COMPREHENSIVE METABOLIC PANEL     Status: Abnormal   Collection Time    01/18/13  2:31 PM      Result Value Range   Sodium 128 (*) 135 - 145 mEq/L   Potassium 5.7 (*) 3.5 - 5.1 mEq/L   Chloride 94 (*) 96 - 112 mEq/L   CO2 22  19 - 32 mEq/L   Glucose, Bld 139 (*) 70 - 99 mg/dL   BUN 13  6 - 23 mg/dL   Creatinine, Ser 1.30 (*) 0.50 - 1.35 mg/dL   Calcium 8.7  8.4 - 86.5 mg/dL   Total Protein 6.6  6.0 - 8.3 g/dL   Albumin 2.4 (*) 3.5 - 5.2 g/dL   AST 7846 (*) 0 - 37 U/L   ALT 445 (*) 0 - 53 U/L   Alkaline Phosphatase 598 (*) 39 - 117 U/L   Total Bilirubin  2.0 (*) 0.3 - 1.2 mg/dL   GFR calc non Af Amer 17 (*) >90 mL/min   GFR calc Af Amer 19 (*) >90 mL/min   Comment:            The eGFR has been calculated     using the CKD EPI equation.     This calculation has not been     validated in all clinical     situations.     eGFR's persistently     <90 mL/min signify     possible Chronic Kidney  Disease.  SODIUM, URINE, RANDOM     Status: None   Collection Time    01/19/13  3:52 AM      Result Value Range   Sodium, Ur 26    COMPREHENSIVE METABOLIC PANEL     Status: Abnormal   Collection Time    01/19/13  5:00 AM      Result Value Range   Sodium 131 (*) 135 - 145 mEq/L   Potassium 4.5  3.5 - 5.1 mEq/L   Chloride 95 (*) 96 - 112 mEq/L   CO2 21  19 - 32 mEq/L   Glucose, Bld 137 (*) 70 - 99 mg/dL   BUN 17  6 - 23 mg/dL   Creatinine, Ser 1.61 (*) 0.50 - 1.35 mg/dL   Calcium 8.0 (*) 8.4 - 10.5 mg/dL   Total Protein 5.8 (*) 6.0 - 8.3 g/dL   Albumin 2.1 (*) 3.5 - 5.2 g/dL   AST 096 (*) 0 - 37 U/L   ALT 308 (*) 0 - 53 U/L   Alkaline Phosphatase 548 (*) 39 - 117 U/L   Total Bilirubin 1.2  0.3 - 1.2 mg/dL   GFR calc non Af Amer 15 (*) >90 mL/min   GFR calc Af Amer 18 (*) >90 mL/min   Comment:            The eGFR has been calculated     using the CKD EPI equation.     This calculation has not been     validated in all clinical     situations.     eGFR's persistently     <90 mL/min signify     possible Chronic Kidney Disease.  CBC     Status: Abnormal   Collection Time    01/19/13  5:00 AM      Result Value Range   WBC 7.5  4.0 - 10.5 K/uL   RBC 2.60 (*) 4.22 - 5.81 MIL/uL   Hemoglobin 7.7 (*) 13.0 - 17.0 g/dL   HCT 04.5 (*) 40.9 - 81.1 %   MCV 88.1  78.0 - 100.0 fL   MCH 29.6  26.0 - 34.0 pg   MCHC 33.6  30.0 - 36.0 g/dL   RDW 91.4 (*) 78.2 - 95.6 %   Platelets 186  150 - 400 K/uL  PROTIME-INR     Status: Abnormal   Collection Time    01/19/13  5:00 AM      Result Value Range   Prothrombin Time 24.7 (*) 11.6 - 15.2 seconds   INR 2.35 (*) 0.00 - 1.49  HEMOGLOBIN AND HEMATOCRIT, BLOOD     Status: Abnormal   Collection Time    01/19/13  1:44 PM      Result Value Range   Hemoglobin 7.5 (*) 13.0 - 17.0 g/dL   HCT 21.3 (*) 08.6 - 57.8 %  ABO/RH     Status: None   Collection Time  01/19/13  4:05 PM      Result Value Range   ABO/RH(D) O POS    PREPARE RBC  (CROSSMATCH)     Status: None   Collection Time    01/19/13  4:06 PM      Result Value Range   Order Confirmation ORDER PROCESSED BY BLOOD BANK    TYPE AND SCREEN     Status: None   Collection Time    01/19/13  4:12 PM      Result Value Range   ABO/RH(D) O POS     Antibody Screen NEG     Sample Expiration 01/22/2013     Unit Number [id number]     Blood Component Type RED CELLS,LR     Unit division 00     Status of Unit ISSUED     Transfusion Status OK TO TRANSFUSE     Crossmatch Result Compatible     Unit Number [id number]     Blood Component Type RED CELLS,LR     Unit division 00     Status of Unit ALLOCATED     Transfusion Status OK TO TRANSFUSE     Crossmatch Result Compatible      US Abdomen Complete  01/18/2013   *RADIOLOGY REPORT*  Clinical Data:  Elevated transaminases.  Nausea.  ABDOMINAL ULTRASOUND COMPLETE  Comparison:  None.  Findings:  Gallbladder:  Gallstones are seen towards the neck of the gallbladder.  The largest is 1.4 cm.  No Murphy's sign or pericholecystic fluid.  Common Bile Duct:  Within normal limits in caliber.  Liver: The liver is diffusely heterogeneous without focal mass.The left lobe is obscured by overlying bowel gas.  IVC:  Suboptimally visualized.  Grossly within normal limits.  Pancreas:  Obscured by overlying bowel gas.  Spleen:  Within normal limits in size and echotexture.  Right kidney:  Normal in size and parenchymal echogenicity.  No evidence of mass or hydronephrosis.  Left kidney:  Normal in size and parenchymal echogenicity.  No evidence of mass or hydronephrosis.  Abdominal Aorta:  Obscured by overlying bowel gas.  IMPRESSION: Limited visualization of the pancreas, aorta, and left lobe of the liver.  Cholelithiasis.  The liver is diffusely heterogeneous compatible with diffuse hepatic parenchymal disease.   Original Report Authenticated By: Jolaine Click, [name]   Review of Systems  Constitutional: Negative for fever, chills, weight loss,  malaise/fatigue and diaphoresis.  Respiratory: Positive for shortness of breath. Negative for cough, hemoptysis, sputum production and wheezing.   Cardiovascular: Positive for chest pain, palpitations and leg swelling. Negative for PND.  Gastrointestinal: Positive for heartburn. Negative for nausea, vomiting, abdominal pain, diarrhea, constipation and blood in stool.  Genitourinary: Positive for dysuria. Negative for urgency, frequency, hematuria and flank pain.  Musculoskeletal: Positive for myalgias, back pain and joint pain.  Skin: Positive for itching and rash.  Neurological: Negative for sensory change, speech change, focal weakness and seizures.  Psychiatric/Behavioral: Positive for substance abuse. The patient is nervous/anxious and has insomnia.    Blood pressure 102/59, pulse 94, temperature 98 F (36.7 C), temperature source Oral, resp. rate 18, height 5\' 9"  (1.753 m), weight 99.7 kg (219 lb 12.8 oz), SpO2 97.00%. Physical Exam  Constitutional: He is oriented to person, place, and time. He appears well-developed and well-nourished.  HENT:  Head: Normocephalic and atraumatic.  Eyes: Conjunctivae and EOM are normal. Pupils are equal, round, and reactive to light.  Neck: Normal range of motion. Neck supple.  Cardiovascular: Normal rate and regular rhythm.  Respiratory: Effort normal and breath sounds normal.  GI: Soft. Bowel sounds are normal. He exhibits no distension. There is no tenderness. There is no rebound and no guarding.  Musculoskeletal: Normal range of motion.  Neurological: He is alert and oriented to person, place, and time.  Skin: Skin is warm and dry. Rash noted.  Psychiatric: He has a normal mood and affect. His behavior is normal. Judgment and thought content normal.   Assessment/Plan: 1) Anemia with a drop in the hemoglobin to 7.7 gms/dl. Will check guaiacs and make further recommendations.  2) Abnormal LFT's-probably multiple factorial with his history of  alcoholism and fatty liver along with CAD. Infact his LFT's are improving slowly. Liver has an heterogenous appearance on ultrasound-check Hepatitis serologies. 3) Asymptomatic cholelithiasis. 4) HTN/CKD. 5) CAD/ S/P aCABG/ Cardiomyopathy. 6) Psoriasis. 7) Gout  8) Non-compliance. Markos Theil 01/19/2013, 8:13 PM

## 2013-01-19 NOTE — Progress Notes (Signed)
Pt's hgb=7.7 Dr Katha Cabal notified, awaiting orders at this time.

## 2013-01-19 NOTE — Consult Note (Signed)
WOC consult Note Reason for Consult:Bilateral feet. NOTE:  Patient is followed by his [facility] dermatologist (Dr. Terri Piedra) for his ongoing struggles with psoriasis.  He was to have an appointment this week that he will not be able to keep due to hospitalization. Wound type: inflammatory with intertriginous skin damage between toes and a fissure at the right heel Pressure Ulcer POA: Yes/No Measurement:fissue measures 2.5cm x .2cm; open areas between toes are not measured. Wound GEX:BMWUX, pink Drainage (amount, consistency, odor)  Periwound:Scaling, dry tissue Dressing procedure/placement/frequency: A three times daily application of triamcinolone cream in Eucerin has been ordered since Saturday and I agree with this POC.  Additionally, I will recommend cleansing and placement of a dry dressing between the toes on the right foot and cleansing and placement of a saline dressing to the fissure on the right heel to coincide with the three emollient applications daily.  Patient states that the regimen provided since Saturday has already improved his skin status significantly. WOC Nursing team will not follow, but will remain available to this patient as well as his medical and nursing teams.  Please re-consult if needed. Thanks, Ladona Mow, MSN, RN, [facility], CWOCN [phone])

## 2013-01-19 NOTE — Progress Notes (Signed)
Pt c/o chest pain 3/10 nitro o.4mg  1 tab given.VS 115/78, P111.  Notified Dr. Allena Katz instructed also to notified card.  LDiona Fanti  Notified and also of EKG reading : Vent. Rate 105 (ST), incomplete LBBB, ST & T wave abn., consider inferolateral ischemia.  Abnormal ECG. No new orders given.  Will continue to monitor.  Quirino Kakos,RN.

## 2013-01-19 NOTE — Progress Notes (Signed)
Patient ID: Devin Williams, male   DOB: 07/16/1946, 67 y.o.   MRN: [id number]   [facility] Progress Note    Subjective:   Overnight events noted with furosemide held for ?Elevated creatinine. The patient reports that he actually feels better today and denies any shortness of breath or chest pain. Currently, his biggest concern is his psoriasis.    Objective:   BP 111/76  Pulse 89  Temp(Src) 98.2 F (36.8 C) (Oral)  Resp 20  Ht 5\' 9"  (1.753 m)  Wt 99.7 kg (219 lb 12.8 oz)  BMI 32.44 kg/m2  SpO2 100%  Intake/Output Summary (Last 24 hours) at 01/19/13 0914 Last data filed at 01/19/13 0847  Gross per 24 hour  Intake    840 ml  Output    530 ml  Net    310 ml   Weight change: 1.4 kg (3 lb 1.4 oz)  Physical Exam: Gen: Comfortably resting in bed-diffuse xerosis/peeling skin noted CVS: Pulse regular in rate and rhythm, heart sounds S1 and S2 normal Resp: Diminished breath sounds bilaterally over bases otherwise clear to auscultation Abd: Soft, obese, nontender and bowel sounds are normal Ext: 2+ edema bipedally extends all the way up to the thighs.  Imaging: US Abdomen Complete  01/18/2013   *RADIOLOGY REPORT*  Clinical Data:  Elevated transaminases.  Nausea.  ABDOMINAL ULTRASOUND COMPLETE  Comparison:  None.  Findings:  Gallbladder:  Gallstones are seen towards the neck of the gallbladder.  The largest is 1.4 cm.  No Murphy's sign or pericholecystic fluid.  Common Bile Duct:  Within normal limits in caliber.  Liver: The liver is diffusely heterogeneous without focal mass.The left lobe is obscured by overlying bowel gas.  IVC:  Suboptimally visualized.  Grossly within normal limits.  Pancreas:  Obscured by overlying bowel gas.  Spleen:  Within normal limits in size and echotexture.  Right kidney:  Normal in size and parenchymal echogenicity.  No evidence of mass or hydronephrosis.  Left kidney:  Normal in size and parenchymal echogenicity.  No evidence of mass or  hydronephrosis.  Abdominal Aorta:  Obscured by overlying bowel gas.  IMPRESSION: Limited visualization of the pancreas, aorta, and left lobe of the liver.  Cholelithiasis.  The liver is diffusely heterogeneous compatible with diffuse hepatic parenchymal disease.   Original Report Authenticated By: Jolaine Click, [name]    Labs: BMET  Recent Labs Lab 01/16/13 1242 01/17/13 0911 01/18/13 0355 01/18/13 1431 01/19/13 0500  NA 132* 130* 123* 128* 131*  K 5.0 5.8* 6.4* 5.7* 4.5  CL 97 96 92* 94* 95*  CO2 19 18* 15* 22 21  GLUCOSE 87 137* 125* 139* 137*  BUN 7 9 11 13 17   CREATININE 1.38* 2.14* 2.85* 3.56* 3.83*  CALCIUM 8.8 8.5 8.2* 8.7 8.0*   CBC  Recent Labs Lab 01/16/13 1242 01/17/13 0911 01/18/13 0355 01/19/13 0500  WBC 7.3 10.4 11.0* 7.5  NEUTROABS 5.2  --   --   --   HGB 10.6* 9.2* 8.9* 7.7*  HCT 29.7* 26.3* 25.7* 22.9*  MCV 85.6 85.9 86.5 88.1  PLT 177 160 149* 186    Medications:    . aspirin EC  81 mg Oral Daily  . carvedilol  25 mg Oral BID WC  . dorzolamide-timolol  1 drop Both Eyes QHS  . feeding supplement  237 mL Oral BID BM  . folic acid  1 mg Oral Daily  . furosemide  40 mg Intravenous Q8H  . hydrALAZINE  12.5  mg Oral BID  . hydrocortisone cream  1 application Topical BID  . isosorbide mononitrate  90 mg Oral Daily  . latanoprost  1 drop Both Eyes QHS  . multivitamin with minerals  1 tablet Oral Daily  . sodium chloride  3 mL Intravenous Q12H  . sodium polystyrene  30 g Oral Once  . thiamine  100 mg Oral Daily   Or  . thiamine  100 mg Intravenous Daily  . triamcinolone 0.1 % cream : eucerin  1 application Topical TID  . Warfarin - Pharmacist Dosing Inpatient   Does not apply q1800     Assessment/ Plan:   1. Acute renal failure on chronic kidney disease versus recognition of chronic problem- initial creatinine is 1.38 (may be falsely low from hypervolemia) based on his generous volume status. Ischemic injury suspected with preceding hypotensive  episode/renal hypoperfusion. No acute hemodialysis needs are noted at this time and renal ultrasound is negative for hydronephrosis. Urine output marginal but in the oliguric range (I suspect that there is difficulty with accuracy of starting his urine output given the absence of a Foley catheter-patient refusal).  2. Hypertension/volume - is volume overloaded but hypotensive. Cautiously using furosemide with hold parameters on antihypertensive therapy-hydralazine/carvedilol.  3. Hyponatremia - hypervolemic hyponatremia. Urine sodium low however not within the level suggestive of hepatorenal syndrome. Anticipate improvement with improved renal recovery/urine output. 4. Hyperkalemia- improved status post medical management with Kayexalate.     Zetta Bills, MD 01/19/2013, 9:14 AM

## 2013-01-19 NOTE — Evaluation (Signed)
Occupational Therapy Evaluation Patient Details Name: Devin Williams MRN: [id number] DOB: February 13, 1946 Today's Date: 01/19/2013 Time: 4098-1191 OT Time Calculation (min): 26 min  OT Assessment / Plan / Recommendation Clinical Impression  This 67 yo male admitted LE swelling and history of psoriasis presents to acute OT with problems below. Will benefit from acute OT without need for follow up.    OT Assessment  Patient needs continued OT Services    Follow Up Recommendations  No OT follow up    Barriers to Discharge None    Equipment Recommendations  None recommended by OT       Frequency  Min 2X/week    Precautions / Restrictions Precautions Precautions: None Restrictions Weight Bearing Restrictions: No   Pertinent Vitals/Pain 3/10 feet    ADL  Eating/Feeding: Simulated;Independent Where Assessed - Eating/Feeding: Edge of bed Grooming: Simulated;Set up Where Assessed - Grooming: Supported sitting Upper Body Bathing: Simulated;Set up Where Assessed - Upper Body Bathing: Unsupported sitting Lower Body Bathing: Simulated;Minimal assistance Where Assessed - Lower Body Bathing: Unsupported sit to stand Upper Body Dressing: Simulated;Set up Where Assessed - Upper Body Dressing: Unsupported sitting Lower Body Dressing: Simulated;Minimal assistance Where Assessed - Lower Body Dressing: Unsupported sit to stand Toilet Transfer: Simulated;Minimal assistance Toilet Transfer Method: Sit to stand Toilet Transfer Equipment:  (Bed> hallway>recliner) Toileting - Architect and Hygiene: Simulated;Min guard Where Assessed - Engineer, mining and Hygiene: Standing Equipment Used: Rolling walker;Gait belt Transfers/Ambulation Related to ADLs: S for all    OT Diagnosis: Generalized weakness;Acute pain  OT Problem List: Decreased strength;Pain OT Treatment Interventions: Self-care/ADL training;Balance training;Patient/family education;DME and/or AE  instruction   OT Goals Acute Rehab OT Goals OT Goal Formulation: With patient Time For Goal Achievement: 01/19/13 Potential to Achieve Goals: Good ADL Goals Pt Will Perform Lower Body Dressing: Independently;Unsupported;Sit to stand from chair;Sit to stand from bed ADL Goal: Lower Body Dressing - Progress: Goal set today Pt Will Transfer to Toilet: Independently;Ambulation;Regular height toilet ADL Goal: Toilet Transfer - Progress: Goal set today  Visit Information  Last OT Received On: 01/19/13 Assistance Needed: +1 PT/OT Co-Evaluation/Treatment: Yes    Subjective Data  Subjective: Well how did I do?   Prior Functioning     Home Living Lives With: Spouse Available Help at Discharge: Family;Available PRN/intermittently Type of Home: House Home Access: Stairs to enter [employer] of Steps: 3 Entrance Stairs-Rails: None Home Layout: Able to live on main level with bedroom/bathroom Bathroom Shower/Tub: Walk-in shower;Tub/shower unit;Curtain Teacher, early years/pre: Yes How Accessible: Accessible via walker Home Adaptive Equipment: Grab bars in shower;Walker - rolling Additional Comments: was using walker last week due to LE swelling Prior Function Level of Independence: Independent Able to Take Stairs?: Yes Driving: No Vocation: Retired Comments: wears glasses for reading Communication Communication: No difficulties Dominant Hand: Right         Vision/Perception Vision - History Baseline Vision: Wears glasses only for reading Patient Visual Report: No change from baseline   Cognition  Cognition Arousal/Alertness: Awake/alert Behavior During Therapy: WFL for tasks assessed/performed Overall Cognitive Status: Within Functional Limits for tasks assessed    Extremity/Trunk Assessment Right Upper Extremity Assessment RUE ROM/Strength/Tone: Within functional levels Left Upper Extremity Assessment LUE ROM/Strength/Tone:  Within functional levels Right Lower Extremity Assessment RLE ROM/Strength/Tone: Within functional levels;Deficits RLE ROM/Strength/Tone Deficits: pt reports ankle to be stiff Left Lower Extremity Assessment LLE ROM/Strength/Tone: Within functional levels Trunk Assessment Trunk Assessment: Normal     Mobility Bed Mobility Bed Mobility: Supine to Sit  Supine to Sit: 5: Supervision Details for Bed Mobility Assistance: definite use of hands Transfers Sit to Stand: 5: Supervision Stand to Sit: 5: Supervision;Without upper extremity assist;With armrests Details for Transfer Assistance: v/c's for safe hand placement/push up from bed           End of Session OT - End of Session Equipment Utilized During Treatment: Gait belt Activity Tolerance: Patient tolerated treatment well Patient left: in chair;with call bell/phone within reach Nurse Communication: Mobility status    Evette Georges [phone] 01/19/2013, 10:50 AM

## 2013-01-19 NOTE — Evaluation (Signed)
Physical Therapy Evaluation Patient Details Name: Devin Williams MRN: [id number] DOB: 1946-08-19 Today's Date: 01/19/2013 Time: 4098-1191 PT Time Calculation (min): 23 min  PT Assessment / Plan / Recommendation Clinical Impression  Pt is a 67 yo male admitted with LE swelling and history of psoriasis. Pt functioning near baseline. Pt does however currently require use of RW for safe amb due to increased R LE foot pain with WBing. Pt safe to d/c home with spouse when medically stable.    PT Assessment  Patient needs continued PT services    Follow Up Recommendations  Supervision - Intermittent;No PT follow up    Does the patient have the potential to tolerate intense rehabilitation      Barriers to Discharge None      Equipment Recommendations  None recommended by PT (pt has RW)    Recommendations for Other Services     Frequency Min 3X/week    Precautions / Restrictions Precautions Precautions: None Restrictions Weight Bearing Restrictions: No   Pertinent Vitals/Pain 3/10 bilat LE pain      Mobility  Bed Mobility Bed Mobility: Supine to Sit Supine to Sit: 5: Supervision Details for Bed Mobility Assistance: definite use of hands Transfers Transfers: Sit to Stand;Stand to Sit Sit to Stand: 5: Supervision Stand to Sit: 5: Supervision;Without upper extremity assist;With armrests Details for Transfer Assistance: v/c's for safe hand placement/push up from bed Ambulation/Gait Ambulation/Gait Assistance: 5: Supervision Ambulation Distance (Feet): 150 Feet Assistive device: Rolling walker Ambulation/Gait Assistance Details: increased UE WBing, R LE limp, mild SOB Gait Pattern: Decreased stride length;Decreased stance time - right;Step-through pattern Gait velocity: WFL Stairs: No Wheelchair Mobility Wheelchair Mobility: No    Exercises     PT Diagnosis: Difficulty walking  PT Problem List: Decreased activity tolerance;Decreased mobility PT Treatment  Interventions: DME instruction;Gait training;Functional mobility training;Therapeutic activities;Therapeutic exercise   PT Goals Acute Rehab PT Goals PT Goal Formulation: With patient Time For Goal Achievement: 01/26/13 Potential to Achieve Goals: Good Pt will go Sit to Stand: Independently;with upper extremity assist (up to RW.) PT Goal: Sit to Stand - Progress: Goal set today Pt will Ambulate: >150 feet;with modified independence;with rolling walker PT Goal: Ambulate - Progress: Goal set today Pt will Go Up / Down Stairs: with supervision (no rail to mimic home set up.) PT Goal: Up/Down Stairs - Progress: Goal set today  Visit Information  Last PT Received On: 01/19/13 Assistance Needed: +1    Subjective Data  Subjective: Pt received supine in bed agreeable to PT. Patient Stated Goal: home ASAP   Prior Functioning  Home Living Lives With: Spouse Available Help at Discharge: Family;Available PRN/intermittently (wife works 2 days/wk, 5-6hrs) Type of Home: House Home Access: Stairs to enter [employer] of Steps: 3 Entrance Stairs-Rails: None Home Layout: Able to live on main level with bedroom/bathroom Bathroom Shower/Tub: Walk-in shower;Tub/shower unit;Curtain Dentist: Grab bars in shower;Walker - rolling Additional Comments: was using walker last week due to LE swelling Prior Function Level of Independence: Independent Able to Take Stairs?: Yes Driving: No Vocation: Retired Comments: wears glasses for reading Communication Communication: No difficulties Dominant Hand: Right    Cognition  Cognition Arousal/Alertness: Awake/alert Behavior During Therapy: WFL for tasks assessed/performed Overall Cognitive Status: Within Functional Limits for tasks assessed    Extremity/Trunk Assessment Right Upper Extremity Assessment RUE ROM/Strength/Tone: Within functional levels Left Upper Extremity Assessment LUE  ROM/Strength/Tone: Within functional levels Right Lower Extremity Assessment RLE ROM/Strength/Tone: Within functional levels;Deficits RLE ROM/Strength/Tone Deficits: pt  reports ankle to be stiff Left Lower Extremity Assessment LLE ROM/Strength/Tone: Within functional levels Trunk Assessment Trunk Assessment: Normal   Balance    End of Session PT - End of Session Equipment Utilized During Treatment: Gait belt Activity Tolerance: Patient tolerated treatment well Patient left: in chair;with call bell/phone within reach Nurse Communication: Mobility status  GP     Marcene Brawn 01/19/2013, 10:36 AM  Lewis Shock, PT, DPT Pager #: [phone] Office #: [phone]

## 2013-01-19 NOTE — Progress Notes (Signed)
Subjective:  Denies SOB  Objective:  Vital Signs in the last 24 hours: Temp:  [98 F (36.7 C)-98.2 F (36.8 C)] 98.2 F (36.8 C) (05/19 0500) Pulse Rate:  [88-95] 89 (05/19 0500) Resp:  [20] 20 (05/19 0500) BP: (102-122)/(64-79) 122/79 mmHg (05/19 1004) SpO2:  [100 %] 100 % (05/19 0500) Weight:  [99.7 kg (219 lb 12.8 oz)] 99.7 kg (219 lb 12.8 oz) (05/19 0500)  Intake/Output from previous day:  Intake/Output Summary (Last 24 hours) at 01/19/13 1051 Last data filed at 01/19/13 0930  Gross per 24 hour  Intake   1062 ml  Output    530 ml  Net    532 ml   . aspirin EC  81 mg Oral Daily  . carvedilol  25 mg Oral BID WC  . dorzolamide-timolol  1 drop Both Eyes QHS  . feeding supplement  237 mL Oral BID BM  . folic acid  1 mg Oral Daily  . furosemide  40 mg Intravenous Q8H  . hydrALAZINE  12.5 mg Oral BID  . hydrocortisone cream  1 application Topical BID  . isosorbide mononitrate  90 mg Oral Daily  . latanoprost  1 drop Both Eyes QHS  . multivitamin with minerals  1 tablet Oral Daily  . sodium chloride  3 mL Intravenous Q12H  . sodium polystyrene  30 g Oral Once  . thiamine  100 mg Oral Daily   Or  . thiamine  100 mg Intravenous Daily  . triamcinolone 0.1 % cream : eucerin  1 application Topical TID   Physical Exam: General appearance: alert, cooperative and no distress Lungs: decreased breath sounds Heart: regular rate and rhythm Extrem: 1+ edema   Rate: 88  Rhythm: normal sinus rhythm  Lab Results:  Recent Labs  01/18/13 0355 01/19/13 0500  WBC 11.0* 7.5  HGB 8.9* 7.7*  PLT 149* 186    Recent Labs  01/18/13 1431 01/19/13 0500  NA 128* 131*  K 5.7* 4.5  CL 94* 95*  CO2 22 21  GLUCOSE 139* 137*  BUN 13 17  CREATININE 3.56* 3.83*    Recent Labs  01/16/13 1242  TROPONINI <0.30   Hepatic Function Panel  Recent Labs  01/19/13 0500  PROT 5.8*  ALBUMIN 2.1*  AST 830*  ALT 308*  ALKPHOS 548*  BILITOT 1.2   No results found for this  basename: CHOL,  in the last 72 hours  Recent Labs  01/19/13 0500  INR 2.35*    Imaging: US Abdomen Complete  01/18/2013   *RADIOLOGY REPORT*  Clinical Data:  Elevated transaminases.  Nausea.  ABDOMINAL ULTRASOUND COMPLETE  Comparison:  None.    IMPRESSION: Limited visualization of the pancreas, aorta, and left lobe of the liver.  Cholelithiasis.  The liver is diffusely heterogeneous compatible with diffuse hepatic parenchymal disease.   Original Report Authenticated By: Jolaine Click, [name]    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 Last cath 2008 - med Rx. Negative Myoview 5/12   Alcoholism   Transaminitis   Hyperkalemia   Chronic anticoagulation   Anemia   DVT (deep venous thrombosis) March 2014   HTN (hypertension)   Hyperlipidemia   Psoriasis   Hypoalbuminemia   Cardiomyopathy, ischemic - EF 45-50% March 2014    PLAN:   He is significantly anemic - 7.7. Will check stools. GI  has been called. Appreciate renal service assistance. Will discuss repeat echo with MD. Hold Coumadin.   Corine Shelter PA-C Beeper [phone] 01/19/2013, 10:51 AM   4pm- Hgb down to 7.5. Pt had chest pain this afternoon. Will type and cross and transfuse. Awaiting GI consult.  Corine Shelter PA-C 01/19/2013 3:48 PM   Patient seen and examined. Agree with assessment and plan. Breathing better; no sob. Decreased breath sounds without wheezing. RRR 1/6 sem. Hepatosplenomegaly on exam with BS+ and distended abdomen, 1-2+ edema lower extremities. Awaiting GI input; appreciate renal assesment.   Lennette Bihari, MD, [facility] 01/19/2013 6:42 PM

## 2013-01-20 DIAGNOSIS — I2510 Atherosclerotic heart disease of native coronary artery without angina pectoris: Secondary | ICD-10-CM

## 2013-01-20 DIAGNOSIS — D649 Anemia, unspecified: Secondary | ICD-10-CM

## 2013-01-20 DIAGNOSIS — I5023 Acute on chronic systolic (congestive) heart failure: Secondary | ICD-10-CM

## 2013-01-20 DIAGNOSIS — Z7901 Long term (current) use of anticoagulants: Secondary | ICD-10-CM

## 2013-01-20 DIAGNOSIS — I2589 Other forms of chronic ischemic heart disease: Secondary | ICD-10-CM

## 2013-01-20 LAB — TYPE AND SCREEN
ABO/RH(D): O POS
Antibody Screen: NEGATIVE
Unit division: 0
Unit division: 0

## 2013-01-20 LAB — COMPREHENSIVE METABOLIC PANEL
Albumin: 2.3 g/dL — ABNORMAL LOW (ref 3.5–5.2)
BUN: 23 mg/dL (ref 6–23)
Calcium: 7.7 mg/dL — ABNORMAL LOW (ref 8.4–10.5)
Creatinine, Ser: 3.43 mg/dL — ABNORMAL HIGH (ref 0.50–1.35)
GFR calc Af Amer: 20 mL/min — ABNORMAL LOW (ref >90)
Glucose, Bld: 130 mg/dL — ABNORMAL HIGH (ref 70–99)
Potassium: 3.6 mEq/L (ref 3.5–5.1)
Total Protein: 5.7 g/dL — ABNORMAL LOW (ref 6.0–8.3)

## 2013-01-20 LAB — CBC
HCT: 24.7 % — ABNORMAL LOW (ref 39.0–52.0)
MCV: 84 fL (ref 78.0–100.0)
RBC: 2.94 MIL/uL — ABNORMAL LOW (ref 4.22–5.81)
RDW: 15.7 % — ABNORMAL HIGH (ref 11.5–15.5)
WBC: 8.2 10*3/uL (ref 4.0–10.5)

## 2013-01-20 LAB — PROTIME-INR: INR: 1.61 — ABNORMAL HIGH (ref 0.00–1.49)

## 2013-01-20 MED ORDER — ALBUMIN HUMAN 25 % IV SOLN
25.0000 g | Freq: Once | INTRAVENOUS | Status: AC
  Administered 2013-01-20: 25 g via INTRAVENOUS
  Filled 2013-01-20: qty 100

## 2013-01-20 MED ORDER — FUROSEMIDE 10 MG/ML IJ SOLN
40.0000 mg | Freq: Once | INTRAMUSCULAR | Status: AC
  Administered 2013-01-20: 40 mg via INTRAVENOUS

## 2013-01-20 NOTE — Progress Notes (Signed)
ANTICOAGULATION CONSULT NOTE - Initial Consult  Pharmacy Consult for Warfarin Indication: VTE prophylaxis (Hx DVT)  Allergies  Allergen Reactions  . Penicillins     Patient Measurements: Height: 5\' 9"  (175.3 cm) Weight: 218 lb (98.884 kg) (bed scale) IBW/kg (Calculated) : 70.7 Heparin Dosing Weight:   Vital Signs: Temp: 97.6 F (36.4 C) (05/20 0650) Temp src: Oral (05/20 0650) BP: 127/74 mmHg (05/20 0650) Pulse Rate: 85 (05/20 0650)  Labs:  Recent Labs  01/18/13 0355 01/18/13 1431 01/19/13 0500 01/19/13 1344 01/20/13 0430  HGB 8.9*  --  7.7* 7.5* 8.8*  HCT 25.7*  --  22.9* 21.3* 24.7*  PLT 149*  --  186  --  204  LABPROT 30.5*  --  24.7*  --  18.6*  INR 3.13*  --  2.35*  --  1.61*  CREATININE 2.85* 3.56* 3.83*  --  3.43*    Estimated Creatinine Clearance: 24.9 ml/min (by C-G formula based on Cr of 3.43).   Assessment: 67 year old male admitted with increasing Scr, LFTs, and electrolyte abnormalities. On Coumadin prior to admission for history of DVT. Pharmacy is now consulted to dose Coumadin. Home dose is 5mg  daily, with last dose taken on 5/16.   INR today is 1.61 with trend down and LFTs noted at 327/215. Hg noted at 7.7 on 5/19 and up to 8.8 today post transfusion. Coumadin held 5/19 per MD. GI saw patient 5/19 and recommendations pending.   Goal of Therapy:  INR 2-3 Monitor platelets by anticoagulation protocol: Yes   Plan:  - Hold coumadin - Daily INR - Monitor for bleeding   Harland German, Pharm D 01/20/2013 9:30 AM

## 2013-01-20 NOTE — Progress Notes (Signed)
Subjective: Feels well.  No complaints.  Objective: Vital signs in last 24 hours: Temp:  [97.6 F (36.4 C)-98.6 F (37 C)] 97.8 F (36.6 C) (05/20 1522) Pulse Rate:  [85-98] 90 (05/20 1522) Resp:  [16-18] 18 (05/20 1522) BP: (98-127)/(59-74) 115/67 mmHg (05/20 1522) SpO2:  [97 %] 97 % (05/20 1522) Weight:  [218 lb (98.884 kg)] 218 lb (98.884 kg) (05/20 0650) Last BM Date: 01/20/13  Intake/Output from previous day: 05/19 0701 - 05/20 0700 In: 1830.8 [P.O.:902; I.V.:250; Blood:678.8] Out: 925 [Urine:925] Intake/Output this shift: Total I/O In: 682 [P.O.:682] Out: 625 [Urine:625]  PE: Gen: NAD. ABM: Obese, soft, NTND. Skin: exfoliating  Lab Results:  Recent Labs  01/18/13 0355 01/19/13 0500 01/19/13 1344 01/20/13 0430  WBC 11.0* 7.5  --  8.2  HGB 8.9* 7.7* 7.5* 8.8*  HCT 25.7* 22.9* 21.3* 24.7*  PLT 149* 186  --  204   BMET  Recent Labs  01/18/13 1431 01/19/13 0500 01/20/13 0430  NA 128* 131* 133*  K 5.7* 4.5 3.6  CL 94* 95* 96  CO2 22 21 23   GLUCOSE 139* 137* 130*  BUN 13 17 23   CREATININE 3.56* 3.83* 3.43*  CALCIUM 8.7 8.0* 7.7*   LFT  Recent Labs  01/20/13 0430  PROT 5.7*  ALBUMIN 2.3*  AST 327*  ALT 215*  ALKPHOS 491*  BILITOT 1.2   PT/INR  Recent Labs  01/19/13 0500 01/20/13 0430  LABPROT 24.7* 18.6*  INR 2.35* 1.61*   Hepatitis Panel No results found for this basename: HEPBSAG, HCVAB, HEPAIGM, HEPBIGM,  in the last 72 hours C-Diff No results found for this basename: CDIFFTOX,  in the last 72 hours Fecal Lactopherrin No results found for this basename: FECLLACTOFRN,  in the last 72 hours  Studies/Results: No results found.  Medications:  Assessment/Plan: 1) Elevated liver enzymes. 2) New onset anemia. 3) CAD.   The patient's liver enzymes are trending downwards rapidly.  Upon admission he had issues with tachycardia, but no reports of hypotension.  I think he had a transient bout of shock liver when he was tachycardic.   His acute viral hepatitis panels are pending, but I doubt this is the etiology.  He has a history of ETOH abuse and his ultrasound is consistent with parenchymal disease.  As for his anemia, this is new in onset.  He was started on coumadin and before March this year his HGB was in the 12-14 range.  He reports having a colonoscopy in the past, but he does not appear to be a reliable historian.  Plan: 1) Await viral hepatitis panel. 2) EGD/Colonoscopy on Thursday or Friday.  LOS: 4 days   Rhenda Oregon D 01/20/2013, 6:45 PM

## 2013-01-20 NOTE — Progress Notes (Addendum)
Subjective:  He still really denies any significant dyspnea. He is lying flat with no signs of pulmonary edema. His lungs remain clear and chest x-ray was clear on admission. He says his skin is doing much better on the treatment is a using. His leg swelling is significantly improved. He is just a bit confused as MI with his sudden onset renal failure and anemia and etc.  Objective:  Vital Signs in the last 24 hours: Temp:  [97.6 F (36.4 C)-98.6 F (37 C)] 97.6 F (36.4 C) (05/20 0650) Pulse Rate:  [85-98] 85 (05/20 0650) Resp:  [16-20] 18 (05/20 0650) BP: (98-127)/(54-77) 114/62 mmHg (05/20 1004) SpO2:  [97 %] 97 % (05/20 0650) Weight:  [98.884 kg (218 lb)] 98.884 kg (218 lb) (05/20 0650)  Intake/Output from previous day: 05/19 0701 - 05/20 0700 In: 1830.8 [P.O.:902; I.V.:250; Blood:678.8] Out: 925 [Urine:925] Intake/Output from this shift: Total I/O In: 462 [P.O.:462] Out: 400 [Urine:400]  Physical Exam: General appearance: alert, cooperative, appears stated age, no distress and moderately obese Neck: no carotid bruit and Mildly elevated JVD but more with HJR with lying flat. Lungs: clear to auscultation bilaterally, normal percussion bilaterally and nonlabored Heart: regular rate and rhythm, no S3 or S4 and No M./R./G. Abdomen: normal findings: bowel sounds normal and soft, non-tender and abnormal findings:  hepatomegaly, liver edge palpable 4 cm below costal margin Extremities: edema Notably improved. Pulses: 2+ and symmetric Skin: Extensive psoriasis scaling and peeling. This is dramatically improved from initial evaluation. Most notably on the face as opposed to anywhere else. or Significant improvement from admission. Neurologic: Grossly normal  Lab Results:  Recent Labs  01/19/13 0500 01/19/13 1344 01/20/13 0430  WBC 7.5  --  8.2  HGB 7.7* 7.5* 8.8*  PLT 186  --  204    Recent Labs  01/19/13 0500 01/20/13 0430  NA 131* 133*  K 4.5 3.6  CL 95* 96  CO2 21  23  GLUCOSE 137* 130*  BUN 17 23  CREATININE 3.83* 3.43*   No results found for this basename: TROPONINI, CK, MB,  in the last 72 hours Hepatic Function Panel  Recent Labs  01/20/13 0430  PROT 5.7*  ALBUMIN 2.3*  AST 327*  ALT 215*  ALKPHOS 491*  BILITOT 1.2   Abdominal channel shows normal kidneys bilaterally. The liver is diffusely heterogenous and compatible diffuse parenchymal disease.  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 Last cath 2008 - med Rx. Negative Myoview 5/12   Cardiomyopathy, ischemic - EF 45-50% March 2014   Anemia   HTN (hypertension)   Hypoalbuminemia   Chronic anticoagulation   Hyperlipidemia   DVT (deep venous thrombosis) March 2014   Alcoholism   Acute on chronic systolic HF (heart failure)   Psoriasis   Transaminitis   Hyperkalemia  Continue to be complex with this patient. As I suspected, I don't think his admission symptoms had a new heart failure, would not expect such a dramatic rise in creatinine from simple IV Lasix dosing. I also doubt that he would have that dramatic effect from a short period of hypotension. Upon admission he was hypertensive but became hypotensive later on that evening. The more likely is renal cysts he has somebody with his anemia which is also explained. Thankfully his kidney function seems to be improving after transfusion with a modest rise in blood count.  At this point I will defer management of renal  function and anemia to the nephrologists. Do think it he went into question as to plus or minus involvement of heart failure B8 right or left-sided, the main way to clarify the issue would be with a right heart catheterization. He is very reluctant to have a right heart catheterization. I am reluctant to do so until his bleeding issues have resolved. However I think ultimately this may be necessary. Would certainly not recommend left heart catheterization  with angiography until his renal function has stabilized. My impression is that his edema was probably due to the lower 70 DVTs and then being back on his feet. He also probably has chronic liver disease with combination of obesity and chronic alcohol abuse. There is no suggestion of Budd-Chiari syndrome on ultrasound.  There's been no sign of GI bleed, however over a week ago he had INR of 10. We may need to consider a noncontrasted CT scan to search for other areas of possible hematoma/blood loss. To date guaiac stools have not been forthcoming. Agree with holding anticoagulation for now, would probably consider stopping after 3 months regardless & even consider IVC filter.     LOS: 4 days    HARDING,DAVID W 01/20/2013, 12:59 PM

## 2013-01-20 NOTE — Progress Notes (Signed)
Patient ID: Devin Williams, male   DOB: March 31, 1946, 67 y.o.   MRN: [id number]   [facility] Progress Note    Subjective:   Reports to be feeling better with regards to shortness of breath-urine output noted better to him overnight. Continues to be bothered by psoriasis rash.    Objective:   BP 127/74  Pulse 85  Temp(Src) 97.6 F (36.4 C) (Oral)  Resp 18  Ht 5\' 9"  (1.753 m)  Wt 98.884 kg (218 lb)  BMI 32.18 kg/m2  SpO2 97%  Intake/Output Summary (Last 24 hours) at 01/20/13 4098 Last data filed at 01/20/13 0845  Gross per 24 hour  Intake 1830.75 ml  Output   1050 ml  Net 780.75 ml   Weight change: -0.816 kg (-1 lb 12.8 oz)  Physical Exam: Gen: Comfortable sitting up on the side of his bed-diffuse xerosis/flaking skin noted CVS: Pulse regular in rate and rhythm, heart sounds S1 and S2 normal Resp: Coarse breath sounds-no rales/rhonchi Abd: Soft, obese, nontender and bowel sounds are normal Ext: 2+ lower extremity edema  Imaging: US Abdomen Complete  01/18/2013   *RADIOLOGY REPORT*  Clinical Data:  Elevated transaminases.  Nausea.  ABDOMINAL ULTRASOUND COMPLETE  Comparison:  None.  Findings:  Gallbladder:  Gallstones are seen towards the neck of the gallbladder.  The largest is 1.4 cm.  No Murphy's sign or pericholecystic fluid.  Common Bile Duct:  Within normal limits in caliber.  Liver: The liver is diffusely heterogeneous without focal mass.The left lobe is obscured by overlying bowel gas.  IVC:  Suboptimally visualized.  Grossly within normal limits.  Pancreas:  Obscured by overlying bowel gas.  Spleen:  Within normal limits in size and echotexture.  Right kidney:  Normal in size and parenchymal echogenicity.  No evidence of mass or hydronephrosis.  Left kidney:  Normal in size and parenchymal echogenicity.  No evidence of mass or hydronephrosis.  Abdominal Aorta:  Obscured by overlying bowel gas.  IMPRESSION: Limited visualization of the pancreas, aorta, and  left lobe of the liver.  Cholelithiasis.  The liver is diffusely heterogeneous compatible with diffuse hepatic parenchymal disease.   Original Report Authenticated By: Jolaine Click, [name]    Labs: BMET  Recent Labs Lab 01/16/13 1242 01/17/13 0911 01/18/13 0355 01/18/13 1431 01/19/13 0500 01/20/13 0430  NA 132* 130* 123* 128* 131* 133*  K 5.0 5.8* 6.4* 5.7* 4.5 3.6  CL 97 96 92* 94* 95* 96  CO2 19 18* 15* 22 21 23   GLUCOSE 87 137* 125* 139* 137* 130*  BUN 7 9 11 13 17 23   CREATININE 1.38* 2.14* 2.85* 3.56* 3.83* 3.43*  CALCIUM 8.8 8.5 8.2* 8.7 8.0* 7.7*   CBC  Recent Labs Lab 01/16/13 1242 01/17/13 0911 01/18/13 0355 01/19/13 0500 01/19/13 1344 01/20/13 0430  WBC 7.3 10.4 11.0* 7.5  --  8.2  NEUTROABS 5.2  --   --   --   --   --   HGB 10.6* 9.2* 8.9* 7.7* 7.5* 8.8*  HCT 29.7* 26.3* 25.7* 22.9* 21.3* 24.7*  MCV 85.6 85.9 86.5 88.1  --  84.0  PLT 177 160 149* 186  --  204    Medications:    . aspirin EC  81 mg Oral Daily  . carvedilol  25 mg Oral BID WC  . dorzolamide-timolol  1 drop Both Eyes QHS  . feeding supplement  237 mL Oral BID BM  . folic acid  1 mg Oral Daily  . furosemide  40 mg Intravenous Q8H  . hydrALAZINE  12.5 mg Oral BID  . hydrocortisone cream  1 application Topical BID  . isosorbide mononitrate  90 mg Oral Daily  . latanoprost  1 drop Both Eyes QHS  . multivitamin with minerals  1 tablet Oral Daily  . sodium chloride  3 mL Intravenous Q12H  . sodium polystyrene  30 g Oral Once  . thiamine  100 mg Oral Daily   Or  . thiamine  100 mg Intravenous Daily  . triamcinolone 0.1 % cream : eucerin  1 application Topical TID     Assessment/ Plan:   1. Acute renal failure on chronic kidney disease versus recognition of chronic problem- initial creatinine is 1.38 (may have been falsely low from the dilutional effect of hypervolemia). Ischemic injury suspected with preceding hypotensive episode/renal hypoperfusion. Urine output noted to be fair and  renal function improving on labs. No acute need for intervention/dialysis at this time. He is back on scheduled intravenous furosemide. 2. Hypertension/volume - is volume overloaded but hypotensive. On scheduled intravenous furosemide with hold parameters on antihypertensive therapy-hydralazine/carvedilol.  3. Hyponatremia - hypervolemic hyponatremia. Urine sodium low however not within the level suggestive of hepatorenal syndrome. Anticipate improvement with improved renal recovery/urine output.  4. Hyperkalemia- improved status post medical management with Kayexalate.    Zetta Bills, MD 01/20/2013, 9:39 AM

## 2013-01-21 DIAGNOSIS — I5043 Acute on chronic combined systolic (congestive) and diastolic (congestive) heart failure: Secondary | ICD-10-CM

## 2013-01-21 LAB — PROTIME-INR: Prothrombin Time: 16.5 seconds — ABNORMAL HIGH (ref 11.6–15.2)

## 2013-01-21 LAB — COMPREHENSIVE METABOLIC PANEL
AST: 155 U/L — ABNORMAL HIGH (ref 0–37)
Albumin: 2.5 g/dL — ABNORMAL LOW (ref 3.5–5.2)
Alkaline Phosphatase: 425 U/L — ABNORMAL HIGH (ref 39–117)
BUN: 29 mg/dL — ABNORMAL HIGH (ref 6–23)
CO2: 27 mEq/L (ref 19–32)
Chloride: 99 mEq/L (ref 96–112)
Creatinine, Ser: 2.66 mg/dL — ABNORMAL HIGH (ref 0.50–1.35)
GFR calc non Af Amer: 24 mL/min — ABNORMAL LOW (ref >90)
Potassium: 4.2 mEq/L (ref 3.5–5.1)
Total Bilirubin: 1.1 mg/dL (ref 0.3–1.2)

## 2013-01-21 LAB — CBC
MCH: 30.6 pg (ref 26.0–34.0)
MCV: 86.8 fL (ref 78.0–100.0)
Platelets: 230 10*3/uL (ref 150–400)
RBC: 3.17 MIL/uL — ABNORMAL LOW (ref 4.22–5.81)
RDW: 16.4 % — ABNORMAL HIGH (ref 11.5–15.5)

## 2013-01-21 LAB — HEPATITIS PANEL, ACUTE
Hep A IgM: NEGATIVE
Hep B C IgM: NEGATIVE
Hepatitis B Surface Ag: NEGATIVE

## 2013-01-21 NOTE — Progress Notes (Signed)
Pt. Seen and examined. Agree with the NP/PA-C note as written.  Renal and hepatic function continue to improve with time and holding numerous medications, ?etiology of shock liver and kidney? Agree liver dysfunction is multifactorial. He also has not had access to alcohol during the hospitalization. Would not consider left and/or right heart catheterization anytime soon.  Chrystie Nose, MD, [facility] Attending Cardiologist [facility]

## 2013-01-21 NOTE — Progress Notes (Signed)
Occupational Therapy Treatment and Discharge Patient Details Name: KENI ELISON MRN: [id number] DOB: August 22, 1946 Today's Date: 01/21/2013 Time: 4098-1191 OT Time Calculation (min): 21 min  OT Assessment / Plan / Recommendation Comments on Treatment Session This 67 yo has met his OT goals, will D/C.    Follow Up Recommendations  No OT follow up       Equipment Recommendations  None recommended by OT       Frequency Min 2X/week   Plan Discharge plan remains appropriate    Precautions / Restrictions Precautions Precautions: None Restrictions Weight Bearing Restrictions: No       ADL  Lower Body Dressing: Performed;Modified independent Where Assessed - Lower Body Dressing: Unsupported sit to stand Toilet Transfer: Performed;Modified independent Toilet Transfer Method: Sit to Barista: Regular height toilet;Grab bars ADL Comments: Pt still stating that his toes are sticking together due to his skin peeling; used small gauze wrap and wrapped between toes and forefoot--made RN aware.      OT Goals ADL Goals ADL Goal: Lower Body Dressing - Progress: Met ADL Goal: Toilet Transfer - Progress: Met  Visit Information  Last OT Received On: 01/21/13 Assistance Needed: +1          Cognition  Cognition Arousal/Alertness: Awake/alert Behavior During Therapy: WFL for tasks assessed/performed Overall Cognitive Status: Within Functional Limits for tasks assessed    Mobility  Transfers Sit to Stand: 7: Independent Stand to Sit: 7: Independent          End of Session OT - End of Session Activity Tolerance: Patient tolerated treatment well Patient left:  (standing at sink shaving with electric razor--NT present) Nurse Communication:  (wrapped his toes and forefoot)       Evette Georges [phone] 01/21/2013, 4:03 PM

## 2013-01-21 NOTE — Progress Notes (Signed)
01/21/13 1400 In to speak with pt. about medication needs.  Pt. states he sometimes has difficulty obtaining one of his skin prescriptions.  Pt. states he uses [facility] on [address] TC to [facility] [phone]) to inquire of medications.  Was told pt. would need refill RX on Soriatane and Vistaril.  Physician, please write refill RX for these two medications. Pt. states he is able to afford his meds.  Pt. may be ready for dc tomorrow.  [name], RN, BSN NCM [phone]

## 2013-01-21 NOTE — Progress Notes (Deleted)
01/21/13 1430 In to speak with pt. about obtaining a PCP.  Pt. states that he seen Dr. Ronnald Nian in the past.  TC to Dr. Susann Givens [phone]) to inquire of appointment.  Dr. Susann Givens is not taking new patients, however Kristian Covey, PA is taking new patients.  New pt. appointment made for pt. for hospital follow up.  Pt. may be dc tomorrow.  [name], RN, BSN NCM [phone]

## 2013-01-21 NOTE — Progress Notes (Signed)
Unassigned patient  Subjective: Since I last evaluated the patient, he seems to be doing well. No complaints.   Objective: Vital signs in last 24 hours: Temp:  [97.7 F (36.5 C)-98.2 F (36.8 C)] 97.7 F (36.5 C) (05/21 1453) Pulse Rate:  [81-94] 81 (05/21 1453) Resp:  [18-20] 20 (05/21 1453) BP: (119-125)/(67-71) 119/67 mmHg (05/21 1453) SpO2:  [94 %-97 %] 94 % (05/21 1453) Weight:  [98.567 kg (217 lb 4.8 oz)] 98.567 kg (217 lb 4.8 oz) (05/21 0606) Last BM Date: 01/20/13  Intake/Output from previous day: 05/20 0701 - 05/21 0700 In: 1302 [P.O.:1302] Out: 1975 [Urine:1975] Intake/Output this shift:   General appearance: alert, cooperative and no distress Resp: clear to auscultation bilaterally Cardio: regular rate and rhythm, S1, S2 normal, no murmur, click, rub or gallop GI: soft, non-tender; bowel sounds normal; no masses,  no organomegaly  Lab Results:  Recent Labs  01/19/13 0500 01/19/13 1344 01/20/13 0430 01/21/13 0630  WBC 7.5  --  8.2 9.3  HGB 7.7* 7.5* 8.8* 9.7*  HCT 22.9* 21.3* 24.7* 27.5*  PLT 186  --  204 230   BMET  Recent Labs  01/19/13 0500 01/20/13 0430 01/21/13 0630  NA 131* 133* 137  K 4.5 3.6 4.2  CL 95* 96 99  CO2 21 23 27   GLUCOSE 137* 130* 111*  BUN 17 23 29*  CREATININE 3.83* 3.43* 2.66*  CALCIUM 8.0* 7.7* 8.3*   LFT  Recent Labs  01/21/13 0630  PROT 6.3  ALBUMIN 2.5*  AST 155*  ALT 161*  ALKPHOS 425*  BILITOT 1.1   PT/INR  Recent Labs  01/20/13 0430 01/21/13 0630  LABPROT 18.6* 16.5*  INR 1.61* 1.37   Hepatitis Panel  Recent Labs  01/20/13 0430  HEPBSAG NEGATIVE  HCVAB NEGATIVE  HEPAIGM NEGATIVE  HEPBIGM NEGATIVE   Medications: I have reviewed the patient's current medications.  Assessment/Plan: 1) Abnormal LFT's: etiology probably multifactorial. Labs improving slowly.  2) Anemia;currently stable.  3) Malunutrition.  LOS: 5 days   Dash Cardarelli 01/21/2013, 7:52 PM

## 2013-01-21 NOTE — Progress Notes (Signed)
Patient ID: Devin Williams, male   DOB: October 25, 1945, 67 y.o.   MRN: [id number]   [facility] Progress Note    Subjective:   Reports that he continues to feel better-earlier walking around hallways with physical therapy. (Using rolling walker). GI notes reviewed.    Objective:   BP 125/71  Pulse 94  Temp(Src) 98.2 F (36.8 C) (Oral)  Resp 20  Ht 5\' 9"  (1.753 m)  Wt 98.567 kg (217 lb 4.8 oz)  BMI 32.08 kg/m2  SpO2 95%  Intake/Output Summary (Last 24 hours) at 01/21/13 0842 Last data filed at 01/21/13 0842  Gross per 24 hour  Intake   1302 ml  Output   1975 ml  Net   -673 ml   Weight change: -0.318 kg (-11.2 oz)  Physical Exam: Gen: Comfortably ambulating around the hallway CVS: Pulse regular in rate and rhythm, heart sounds S1 and S2 normal Resp: Clear to auscultation bilaterally-no rales Abd: Soft, obese, nontender and bowel sounds are normal Ext: 2+ edema persists  Imaging: No results found.  Labs: BMET  Recent Labs Lab 01/16/13 1242 01/17/13 0911 01/18/13 0355 01/18/13 1431 01/19/13 0500 01/20/13 0430 01/21/13 0630  NA 132* 130* 123* 128* 131* 133* 137  K 5.0 5.8* 6.4* 5.7* 4.5 3.6 4.2  CL 97 96 92* 94* 95* 96 99  CO2 19 18* 15* 22 21 23 27   GLUCOSE 87 137* 125* 139* 137* 130* 111*  BUN 7 9 11 13 17 23  29*  CREATININE 1.38* 2.14* 2.85* 3.56* 3.83* 3.43* 2.66*  CALCIUM 8.8 8.5 8.2* 8.7 8.0* 7.7* 8.3*   CBC  Recent Labs Lab 01/16/13 1242  01/18/13 0355 01/19/13 0500 01/19/13 1344 01/20/13 0430 01/21/13 0630  WBC 7.3  < > 11.0* 7.5  --  8.2 9.3  NEUTROABS 5.2  --   --   --   --   --   --   HGB 10.6*  < > 8.9* 7.7* 7.5* 8.8* 9.7*  HCT 29.7*  < > 25.7* 22.9* 21.3* 24.7* 27.5*  MCV 85.6  < > 86.5 88.1  --  84.0 86.8  PLT 177  < > 149* 186  --  204 230  < > = values in this interval not displayed.  Medications:    . aspirin EC  81 mg Oral Daily  . carvedilol  25 mg Oral BID WC  . dorzolamide-timolol  1 drop Both Eyes QHS  .  feeding supplement  237 mL Oral BID BM  . folic acid  1 mg Oral Daily  . furosemide  40 mg Intravenous Q8H  . hydrALAZINE  12.5 mg Oral BID  . hydrocortisone cream  1 application Topical BID  . isosorbide mononitrate  90 mg Oral Daily  . latanoprost  1 drop Both Eyes QHS  . multivitamin with minerals  1 tablet Oral Daily  . sodium chloride  3 mL Intravenous Q12H  . sodium polystyrene  30 g Oral Once  . thiamine  100 mg Oral Daily   Or  . thiamine  100 mg Intravenous Daily  . triamcinolone 0.1 % cream : eucerin  1 application Topical TID     Assessment/ Plan:   1. Acute renal failure on chronic kidney disease versus recognition of chronic problem- initial creatinine is 1.38 (may have been falsely low from the dilutional effect of hypervolemia). Ischemic injury suspected with preceding hypotensive episode/renal hypoperfusion. Urine output noted to be improving on scheduled furosemide and renal function is also improving.  No acute need for intervention/dialysis at this time. 2. Hypertension/volume - remains hypervolemic. On scheduled intravenous furosemide with hold parameters on antihypertensive therapy-hydralazine/carvedilol.  3. Hyponatremia - hypervolemic hyponatremia. Urine sodium low however not within the level suggestive of hepatorenal syndrome. Sodium level improving gradually on diuretic/recovering renal function.  4. Hyperkalemia- resolved status post medical management with Kayexalate.   Given recovering renal function and improving urine output-we'll sign off at this time, please call if we can help further.  Zetta Bills, MD 01/21/2013, 8:42 AM

## 2013-01-21 NOTE — Progress Notes (Signed)
Subjective:  No SOB or chest pain. He tells me his admission complaint was leg pain.  Objective:  Vital Signs in the last 24 hours: Temp:  [97.8 F (36.6 C)-98.2 F (36.8 C)] 98.2 F (36.8 C) (05/21 0606) Pulse Rate:  [89-94] 94 (05/21 0606) Resp:  [18-20] 20 (05/21 0606) BP: (114-125)/(62-71) 125/71 mmHg (05/21 0606) SpO2:  [95 %-97 %] 95 % (05/21 0606) Weight:  [98.567 kg (217 lb 4.8 oz)] 98.567 kg (217 lb 4.8 oz) (05/21 0606)  Intake/Output from previous day:  Intake/Output Summary (Last 24 hours) at 01/21/13 0849 Last data filed at 01/21/13 0842  Gross per 24 hour  Intake   1080 ml  Output   1975 ml  Net   -895 ml    Physical Exam: General appearance: alert, cooperative, no distress and moderately obese Lungs: clear to auscultation bilaterally Heart: regular rate and rhythm   Rate: 90  Rhythm: normal sinus rhythm  Lab Results:  Recent Labs  01/20/13 0430 01/21/13 0630  WBC 8.2 9.3  HGB 8.8* 9.7*  PLT 204 230    Recent Labs  01/20/13 0430 01/21/13 0630  NA 133* 137  K 3.6 4.2  CL 96 99  CO2 23 27  GLUCOSE 130* 111*  BUN 23 29*  CREATININE 3.43* 2.66*   No results found for this basename: TROPONINI, CK, MB,  in the last 72 hours Hepatic Function Panel  Recent Labs  01/21/13 0630  PROT 6.3  ALBUMIN 2.5*  AST 155*  ALT 161*  ALKPHOS 425*  BILITOT 1.1   No results found for this basename: CHOL,  in the last 72 hours  Recent Labs  01/21/13 0630  INR 1.37    Imaging: Imaging results have been reviewed  Cardiac Studies: Echo 11/18/12-- Left ventricle: The cavity size was normal. Systolic function was mildly reduced. The estimated ejection fraction was in the range of 45% to 50%. Hypokinesis of the anteroseptal and anterior myocardium. Doppler parameters are consistent with abnormal left ventricular relaxation (grade 1 diastolic dysfunction). - Right ventricle: The cavity size was moderately dilated. Wall thickness was  normal.   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 Last cath 2008 - med Rx. Negative Myoview 5/12   Alcoholism   Transaminitis   Hyperkalemia   Chronic anticoagulation   Anemia   DVT (deep venous thrombosis) March 2014   HTN (hypertension)   Hyperlipidemia   Psoriasis   Hypoalbuminemia   Cardiomyopathy, ischemic - EF 45-50% March 2014    PLAN:  Anticoagulation (DVT 3/14) on hold secondary to anemia. LFTs trending down. Statin and fenofibrate on hold. GI work up in progress. Renal function gradually improving. No CHF or angina.   Corine Shelter PA-C Beeper [phone] 01/21/2013, 8:49 AM

## 2013-01-21 NOTE — Progress Notes (Signed)
Physical Therapy Discharge Patient Details Name: Devin Williams MRN: [id number] DOB: 07-Feb-1946 Today's Date: 01/21/2013 Time: 5621-3086 PT Time Calculation (min): 26 min  Patient discharged from PT services secondary to goals met and no further PT needs identified.  Has all DME.  Continue to walk in hall with nursing supervision until d/c home.  Nursing aware/agrees.  Please see latest therapy progress note for current level of functioning and progress toward goals.    Progress and discharge plan discussed with patient and/or caregiver: Patient/Caregiver agrees with plan       Dennis Bast 01/21/2013, 9:03 AM

## 2013-01-22 LAB — PROTIME-INR: INR: 1.25 (ref 0.00–1.49)

## 2013-01-22 LAB — COMPREHENSIVE METABOLIC PANEL
Albumin: 2.4 g/dL — ABNORMAL LOW (ref 3.5–5.2)
BUN: 28 mg/dL — ABNORMAL HIGH (ref 6–23)
Calcium: 8.3 mg/dL — ABNORMAL LOW (ref 8.4–10.5)
Creatinine, Ser: 1.88 mg/dL — ABNORMAL HIGH (ref 0.50–1.35)
GFR calc Af Amer: 42 mL/min — ABNORMAL LOW (ref >90)
Glucose, Bld: 119 mg/dL — ABNORMAL HIGH (ref 70–99)
Potassium: 3.7 mEq/L (ref 3.5–5.1)
Total Protein: 5.8 g/dL — ABNORMAL LOW (ref 6.0–8.3)

## 2013-01-22 LAB — CBC
HCT: 26.6 % — ABNORMAL LOW (ref 39.0–52.0)
Hemoglobin: 9.1 g/dL — ABNORMAL LOW (ref 13.0–17.0)
MCV: 88.7 fL (ref 78.0–100.0)
RBC: 3 MIL/uL — ABNORMAL LOW (ref 4.22–5.81)
RDW: 16.7 % — ABNORMAL HIGH (ref 11.5–15.5)
WBC: 9 10*3/uL (ref 4.0–10.5)

## 2013-01-22 MED ORDER — PANTOPRAZOLE SODIUM 40 MG PO TBEC
40.0000 mg | DELAYED_RELEASE_TABLET | Freq: Every day | ORAL | Status: DC
  Administered 2013-01-23 – 2013-02-01 (×10): 40 mg via ORAL
  Filled 2013-01-22 (×10): qty 1

## 2013-01-22 MED ORDER — FAMOTIDINE IN NACL 20-0.9 MG/50ML-% IV SOLN
20.0000 mg | Freq: Once | INTRAVENOUS | Status: AC
  Administered 2013-01-22: 20 mg via INTRAVENOUS
  Filled 2013-01-22 (×2): qty 50

## 2013-01-22 NOTE — Progress Notes (Signed)
[facility]  Subjective: No complaints.  Objective: Vital signs in last 24 hours: Temp:  [97.7 F (36.5 C)-98 F (36.7 C)] 97.9 F (36.6 C) (05/22 0436) Pulse Rate:  [79-81] 79 (05/22 0436) Resp:  [20] 20 (05/22 0436) BP: (119-128)/(67-77) 128/77 mmHg (05/22 0436) SpO2:  [94 %-95 %] 95 % (05/22 0436) Weight:  [214 lb 15.2 oz (97.5 kg)] 214 lb 15.2 oz (97.5 kg) (05/22 0436) Last BM Date: 01/20/13  Intake/Output from previous day: 05/21 0701 - 05/22 0700 In: 920 [P.O.:920] Out: 2240 [Urine:2140; Stool:100] Intake/Output this shift: Total I/O In: 360 [P.O.:360] Out: 500 [Urine:500]  Medications Current Facility-Administered Medications  Medication Dose Route Frequency Provider Last Rate Last Dose  . aspirin EC tablet 81 mg  81 mg Oral Daily Brittainy Simmons, PA-C   81 mg at 01/21/13 1015  . carvedilol (COREG) tablet 25 mg  25 mg Oral BID WC Cecille Aver, MD   25 mg at 01/22/13 0610  . dorzolamide-timolol (COSOPT) 22.3-6.8 MG/ML ophthalmic solution 1 drop  1 drop Both Eyes QHS Brittainy Simmons, PA-C   1 drop at 01/21/13 2234  . feeding supplement (ENSURE COMPLETE) liquid 237 mL  237 mL Oral BID BM Chrystie Nose, MD   237 mL at 01/21/13 1400  . folic acid (FOLVITE) tablet 1 mg  1 mg Oral Daily Chrystie Nose, MD   1 mg at 01/21/13 1016  . furosemide (LASIX) injection 40 mg  40 mg Intravenous Q8H Cecille Aver, MD   40 mg at 01/22/13 0610  . hydrALAZINE (APRESOLINE) tablet 12.5 mg  12.5 mg Oral BID Cecille Aver, MD   12.5 mg at 01/21/13 2234  . hydrocortisone cream 1 % 1 application  1 application Topical BID Robbie Lis, PA-C   1 application at 01/21/13 2234  . hydrOXYzine (ATARAX/VISTARIL) tablet 10 mg  10 mg Oral TID PRN Robbie Lis, PA-C   10 mg at 01/21/13 2016  . isosorbide mononitrate (IMDUR) 24 hr tablet 90 mg  90 mg Oral Daily Brittainy Simmons, PA-C   90 mg at 01/21/13 1015  . latanoprost  (XALATAN) 0.005 % ophthalmic solution 1 drop  1 drop Both Eyes QHS Brittainy Simmons, PA-C   1 drop at 01/21/13 2234  . multivitamin with minerals tablet 1 tablet  1 tablet Oral Daily Brittainy Simmons, PA-C   1 tablet at 01/21/13 1016  . nitroGLYCERIN (NITROSTAT) SL tablet 0.4 mg  0.4 mg Sublingual Q5 min PRN Brittainy Simmons, PA-C   0.4 mg at 01/19/13 1537  . ondansetron (ZOFRAN) tablet 4 mg  4 mg Oral Q6H PRN Brittainy Simmons, PA-C       Or  . ondansetron (ZOFRAN) injection 4 mg  4 mg Intravenous Q6H PRN Brittainy Simmons, PA-C      . oxyCODONE (Oxy IR/ROXICODONE) immediate release tablet 5 mg  5 mg Oral Q4H PRN Brittainy Simmons, PA-C   5 mg at 01/22/13 0610  . sodium chloride 0.9 % injection 3 mL  3 mL Intravenous Q12H Brittainy Simmons, PA-C   3 mL at 01/21/13 2235  . sodium polystyrene (KAYEXALATE) 15 GM/60ML suspension 30 g  30 g Oral Once Dyke Maes, MD      . thiamine (VITAMIN B-1) tablet 100 mg  100 mg Oral Daily Chrystie Nose, MD   100 mg at 01/21/13 1016   Or  . thiamine (B-1) injection 100 mg  100 mg Intravenous Daily Chrystie Nose, MD      .  triamcinolone 0.1 % cream : eucerin cream, 1:1 1 application  1 application Topical TID Robbie Lis, PA-C   1 application at 01/21/13 2234    PE: General appearance: alert, cooperative and no distress Lungs: clear to auscultation bilaterally Heart: regular rate and rhythm, S1, S2 normal, no murmur, click, rub or gallop Abdomen: BS+, nontender,  distended.   Extremities: 1+ LEE Pulses: 2+ and symmetric Neurologic: Grossly normal  Lab Results:   Recent Labs  01/20/13 0430 01/21/13 0630 01/22/13 0600  WBC 8.2 9.3 9.0  HGB 8.8* 9.7* 9.1*  HCT 24.7* 27.5* 26.6*  PLT 204 230 223   BMET  Recent Labs  01/20/13 0430 01/21/13 0630 01/22/13 0600  NA 133* 137 138  K 3.6 4.2 3.7  CL 96 99 98  CO2 23 27 29   GLUCOSE 130* 111* 119*  BUN 23 29* 28*  CREATININE 3.43* 2.66* 1.88*  CALCIUM 7.7* 8.3* 8.3*    PT/INR  Recent Labs  01/20/13 0430 01/21/13 0630 01/22/13 0600  LABPROT 18.6* 16.5* 15.5*  INR 1.61* 1.37 1.25     Assessment/Plan  Principal Problem:   Acute on chronic renal failure Active Problems:   DVT (deep venous thrombosis) March 2014   CAD, 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 Last cath 2008 - med Rx. Negative Myoview 5/12   Alcoholism   HTN (hypertension)   Hyperlipidemia   Acute on chronic combined systolic and diastolic heart failure   Psoriasis   Transaminitis   Hyperkalemia   Hypoalbuminemia   Chronic anticoagulation   Cardiomyopathy, ischemic - EF 45-50% March 2014   Anemia  Plan:  LFTs continue to decrease.  Scr improving a lot.  EGD and colonoscopy planned by GI.  Coumadin(for DVT) and statin on hold.  HGB trending back down after PRBCs. Now 9.1.  INR 1.25. BP and HR stable.     LOS: 6 days    HAGER, BRYAN 01/22/2013 9:59 AM  I have seen and evaluated the patient this AM along with Wilburt Finlay, PA. I agree with his findings, examination as well as impression recommendations.  He continues to be perplexing -- I suspect that he has very tenuous renal & hepatic function that were both exacerbated during his initial evening episode of hypotension.  I do think that he renal failure may well have been "pre-renal" from an arterial flow perspective, but he was clearly volume overloaded from a venous & 3rd space perspective.   Both renal & hepatic markers have improved.   -- This seems to be his trend of late with acute on chronic renal & hepatic failure.  What remains uncertain, is why & how has his HgB level dropped precipitously when volume contraction would be expected.  No obvious GI bleed -- was expecting ECG/Colonoscopy today or tomorrow.  This issue needs to be answered, since he needs to be on anticoagulation for his recent bilateral DVTs.  He seems stable from a CHF standpoint -- breathing (hence LV dysfunction / failure) has not been an issue. We have reduced his  Hydralazine that was started for afterload reduction.  His skin looks the best it has since I met him.  I honestly am not sure about his medical compliance/adherence at home, perhaps related to his EtOH history.  May consider [facility] RN visits to ensure that wound & skin care + medical adherence is ensured.    Nutrition consultation.   Need to see stable Hgb & more resolution of hepatic & renal failure prior to d/c.  It  remains unclear as to +/- EGD/Colonoscopy this admission -- 1 GI consultant note says -- Thurs or Fri, but today no mention of procedures.   Marykay Lex, [name], M.S. [facility] [address] [place], [place]  [address]  [phone] Pager # [phone] 01/22/2013 12:21 PM

## 2013-01-23 DIAGNOSIS — L408 Other psoriasis: Secondary | ICD-10-CM

## 2013-01-23 LAB — CBC
Hemoglobin: 9.4 g/dL — ABNORMAL LOW (ref 13.0–17.0)
MCH: 30.2 pg (ref 26.0–34.0)
MCHC: 34.2 g/dL (ref 30.0–36.0)
RDW: 17 % — ABNORMAL HIGH (ref 11.5–15.5)

## 2013-01-23 LAB — COMPREHENSIVE METABOLIC PANEL
ALT: 86 U/L — ABNORMAL HIGH (ref 0–53)
AST: 52 U/L — ABNORMAL HIGH (ref 0–37)
Alkaline Phosphatase: 277 U/L — ABNORMAL HIGH (ref 39–117)
CO2: 30 mEq/L (ref 19–32)
Calcium: 8.4 mg/dL (ref 8.4–10.5)
Chloride: 99 mEq/L (ref 96–112)
GFR calc non Af Amer: 44 mL/min — ABNORMAL LOW (ref >90)
Glucose, Bld: 106 mg/dL — ABNORMAL HIGH (ref 70–99)
Potassium: 4 mEq/L (ref 3.5–5.1)
Sodium: 138 mEq/L (ref 135–145)

## 2013-01-23 LAB — PROTIME-INR: Prothrombin Time: 15 seconds (ref 11.6–15.2)

## 2013-01-23 MED ORDER — FUROSEMIDE 40 MG PO TABS
60.0000 mg | ORAL_TABLET | Freq: Two times a day (BID) | ORAL | Status: DC
  Administered 2013-01-23 – 2013-02-01 (×17): 60 mg via ORAL
  Filled 2013-01-23 (×20): qty 1

## 2013-01-23 MED ORDER — SODIUM CHLORIDE 0.9 % IV SOLN
INTRAVENOUS | Status: DC
  Administered 2013-01-23: 20 mL/h via INTRAVENOUS
  Administered 2013-01-24: 500 mL via INTRAVENOUS

## 2013-01-23 MED ORDER — PEG 3350-KCL-NA BICARB-NACL 420 G PO SOLR
4000.0000 mL | Freq: Once | ORAL | Status: AC
  Administered 2013-01-23: 4000 mL via ORAL
  Filled 2013-01-23: qty 4000

## 2013-01-23 MED ORDER — ALUM & MAG HYDROXIDE-SIMETH 200-200-20 MG/5ML PO SUSP
30.0000 mL | Freq: Four times a day (QID) | ORAL | Status: DC | PRN
  Administered 2013-01-23: 30 mL via ORAL
  Filled 2013-01-23: qty 30

## 2013-01-23 MED ORDER — HYDRALAZINE HCL 25 MG PO TABS
25.0000 mg | ORAL_TABLET | Freq: Two times a day (BID) | ORAL | Status: DC
  Administered 2013-01-23 – 2013-01-28 (×8): 25 mg via ORAL
  Filled 2013-01-23 (×13): qty 1

## 2013-01-23 NOTE — Plan of Care (Signed)
Problem: Food- and Nutrition-Related Knowledge Deficit (NB-1.1) Goal: Nutrition education Formal process to instruct or train a patient/client in a skill or to impart knowledge to help patients/clients voluntarily manage or modify food choices and eating behavior to maintain or improve health. Outcome: Completed/Met Date Met:  01/23/13 Nutrition Education Note  RD consulted for nutrition education. Pt with hx of CHF.   RD provided "Heart Healthy Nutrition Therapy" handout from the Academy of [employer]. Reviewed patient's dietary recall. Provided examples on ways to decrease sodium and fat intake in diet. Discouraged intake of processed foods and use of salt shaker. Encouraged fresh fruits and vegetables as well as whole grain sources of carbohydrates to maximize fiber intake. Teach back method used.  Expect poor compliance.  Body mass index is 30.95 kg/(m^2). Pt meets criteria for obesity class 1 based on current BMI.  Current diet order is NPO, patient is consuming approximately 100% of meals at this time. Pt reports he has a good appetite. Was eating well PTA. Labs and medications reviewed. No further nutrition interventions warranted at this time. RD contact information provided. If additional nutrition issues arise, please re-consult RD.  Clarene Duke RD, LDN Pager [phone] After Hours pager [phone]

## 2013-01-23 NOTE — Plan of Care (Signed)
Problem: Phase I Progression Outcomes Goal: EF % per last Echo/documented,Core Reminder form on chart Outcome: Completed/Met Date Met:  01/23/13 EF 45-50% per echo on 11/18/12

## 2013-01-23 NOTE — Progress Notes (Signed)
Pt complaining of acid reflux/indigestion. MD notified and new order for Maalox PRN. Baron Hamper, RN 01/23/2013 3:05 AM

## 2013-01-23 NOTE — Progress Notes (Signed)
Utilization Review Completed.   Sandra Brents, RN, BSN Nurse Case Manager  [phone]  

## 2013-01-23 NOTE — Progress Notes (Addendum)
[facility]   Subjective: No specific compalints  Objective: Vital signs in last 24 hours: Temp:  [97.8 F (36.6 C)-98.1 F (36.7 C)] 98.1 F (36.7 C) (05/23 0544) Pulse Rate:  [78-81] 78 (05/23 0544) Resp:  [20] 20 (05/23 0544) BP: (119-126)/(66-74) 119/67 mmHg (05/23 0544) SpO2:  [96 %-99 %] 99 % (05/23 0544) Weight:  [209 lb 10.5 oz (95.1 kg)] 209 lb 10.5 oz (95.1 kg) (05/23 0544) Weight change: -5 lb 4.7 oz (-2.4 kg) Last BM Date: 01/21/13 Intake/Output from previous day: 05/22 0701 - 05/23 0700 In: 823 [P.O.:820; I.V.:3] Out: 3100 [Urine:3100] Intake/Output this shift: Total I/O In: 220 [P.O.:220] Out: 550 [Urine:550]  PE: General:Pleasant affect, NAD Neck:supple, no JVD, no bruits  Heart:S1S2 RRR without murmur, gallup, rub or click Lungs:clear without rales, rhonchi, or wheezes ION:GEXB, non tender, + BS, do not palpate liver spleen or masses Ext:+ lower ext edema, 2+ pedal pulses, 2+ radial pulses Neuro:alert and oriented, MAE, follows commands, + facial symmetry   Lab Results:  Recent Labs  01/22/13 0600 01/23/13 0540  WBC 9.0 8.8  HGB 9.1* 9.4*  HCT 26.6* 27.5*  PLT 223 254   BMET  Recent Labs  01/22/13 0600 01/23/13 0540  NA 138 138  K 3.7 4.0  CL 98 99  CO2 29 30  GLUCOSE 119* 106*  BUN 28* 24*  CREATININE 1.88* 1.60*  CALCIUM 8.3* 8.4   No results found for this basename: TROPONINI, CK, MB,  in the last 72 hours  No results found for this basename: CHOL, HDL, LDLCALC, LDLDIRECT, TRIG, CHOLHDL   No results found for this basename: HGBA1C     Lab Results  Component Value Date   TSH 0.331* 11/29/2012    Hepatic Function Panel  Recent Labs  01/23/13 0540  PROT 5.7*  ALBUMIN 2.2*  AST 52*  ALT 86*  ALKPHOS 277*  BILITOT 0.9  Studies/Results: No results found.  Medications: I have reviewed the patient's current medications. Scheduled Meds: . aspirin EC  81 mg Oral Daily  . carvedilol  25 mg  Oral BID WC  . dorzolamide-timolol  1 drop Both Eyes QHS  . feeding supplement  237 mL Oral BID BM  . folic acid  1 mg Oral Daily  . furosemide  40 mg Intravenous Q8H  . hydrALAZINE  12.5 mg Oral BID  . hydrocortisone cream  1 application Topical BID  . isosorbide mononitrate  90 mg Oral Daily  . latanoprost  1 drop Both Eyes QHS  . multivitamin with minerals  1 tablet Oral Daily  . pantoprazole  40 mg Oral Q0600  . polyethylene glycol-electrolytes  4,000 mL Oral Once  . sodium chloride  3 mL Intravenous Q12H  . sodium polystyrene  30 g Oral Once  . thiamine  100 mg Oral Daily   Or  . thiamine  100 mg Intravenous Daily  . triamcinolone 0.1 % cream : eucerin  1 application Topical TID   Continuous Infusions: . 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 Last cath 2008 - med Rx. Negative Myoview 5/12   Alcoholism   HTN (hypertension)   Hyperlipidemia   Acute on chronic combined systolic and diastolic heart failure   Psoriasis   Transaminitis  Hyperkalemia   Hypoalbuminemia   Chronic anticoagulation   Cardiomyopathy, ischemic - EF 45-50% March 2014   Anemia  PLAN:  Coumadin on hold, EGD/Colonoscopy tomorrow, now for cl liquids and NPO after MN -1409 since admit , wt 209 down from 214 yesterday and 217 on admit  Lasix 40 mg IV TID  LOS: 7 days   Time spent with pt. Including MD time:20 minutes. [facility] [place] [facility]  Nurse Practitioner Certified Pager [phone] 01/23/2013, 9:54 AM  I have seen and evaluated the patient this AM along with Nada Boozer, NP. I agree with her findings, examination as well as impression recommendations.  He looks much better than on admission -- LFTs & renal function improving. Hgb remains stable -- for GI  eval tomorrow.  Need nutritional assistance - albumin 2.2, protein malnutrition - related to alcoholism.   I had a long ~20 minute counseling session with him re: his need to take control of his life -- he admits to ~1 pint of EtOH a day & medical non-compliance.  He has significant liver and renal dysfunction (to the point of significant injury from minimal event) -- need to f/u with GI & Nephrology as well as Dermatology & Cardiology.    GI procedures for tomorrow. -- need advice re +/- restart Warfarin. (would not bridge)  Convert to PO Lasix 60 mg po bid  Keep BB & Nitrate at current dose - increase Hydralazine to 25mg  bid as his BP is now drifting up.  Holding off on Ranexa until renal function stabilizes.  Off of lipid medications due to liver failure (that is improving) - ? Not may options at this time.   He needs EtOH counseling - will consult CSW +/- Behavioral Health.  On skin creams & soriatane   Once we have the issue of Anemia & Anticoagulation resolved, he is approaching time for d/c with TCM-7 f/u.  Needs Nephrology, GI & Dermatology f/u as well as PCP.   Total MD & NP time with patient 50 min.  Marykay Lex, [name], M.S. [facility] [address] [place], [place]  [address]  [phone] Pager # [phone] 01/23/2013 10:28 AM

## 2013-01-23 NOTE — Progress Notes (Signed)
Subjective: No acute events.  Feels well.  Objective: Vital signs in last 24 hours: Temp:  [97.8 F (36.6 C)-98.1 F (36.7 C)] 98.1 F (36.7 C) (05/23 0544) Pulse Rate:  [78-81] 78 (05/23 0544) Resp:  [20] 20 (05/23 0544) BP: (119-126)/(66-74) 119/67 mmHg (05/23 0544) SpO2:  [96 %-99 %] 99 % (05/23 0544) Weight:  [209 lb 10.5 oz (95.1 kg)] 209 lb 10.5 oz (95.1 kg) (05/23 0544) Last BM Date: 01/21/13  Intake/Output from previous day: 05/22 0701 - 05/23 0700 In: 823 [P.O.:820; I.V.:3] Out: 3100 [Urine:3100] Intake/Output this shift: Total I/O In: -  Out: 550 [Urine:550]  General appearance: alert and no distress GI: soft, non-tender; bowel sounds normal; no masses,  no organomegaly  Lab Results:  Recent Labs  01/21/13 0630 01/22/13 0600 01/23/13 0540  WBC 9.3 9.0 8.8  HGB 9.7* 9.1* 9.4*  HCT 27.5* 26.6* 27.5*  PLT 230 223 254   BMET  Recent Labs  01/21/13 0630 01/22/13 0600 01/23/13 0540  NA 137 138 138  K 4.2 3.7 4.0  CL 99 98 99  CO2 27 29 30  GLUCOSE 111* 119* 106*  BUN 29* 28* 24*  CREATININE 2.66* 1.88* 1.60*  CALCIUM 8.3* 8.3* 8.4   LFT  Recent Labs  01/23/13 0540  PROT 5.7*  ALBUMIN 2.2*  AST 52*  ALT 86*  ALKPHOS 277*  BILITOT 0.9   PT/INR  Recent Labs  01/22/13 0600 01/23/13 0540  LABPROT 15.5* 15.0  INR 1.25 1.20   Hepatitis Panel No results found for this basename: HEPBSAG, HCVAB, HEPAIGM, HEPBIGM,  in the last 72 hours C-Diff No results found for this basename: CDIFFTOX,  in the last 72 hours Fecal Lactopherrin No results found for this basename: FECLLACTOFRN,  in the last 72 hours  Studies/Results: No results found.  Medications:  Scheduled: . aspirin EC  81 mg Oral Daily  . carvedilol  25 mg Oral BID WC  . dorzolamide-timolol  1 drop Both Eyes QHS  . feeding supplement  237 mL Oral BID BM  . folic acid  1 mg Oral Daily  . furosemide  40 mg Intravenous Q8H  . hydrALAZINE  12.5 mg Oral BID  . hydrocortisone  cream  1 application Topical BID  . isosorbide mononitrate  90 mg Oral Daily  . latanoprost  1 drop Both Eyes QHS  . multivitamin with minerals  1 tablet Oral Daily  . pantoprazole  40 mg Oral Q0600  . sodium chloride  3 mL Intravenous Q12H  . sodium polystyrene  30 g Oral Once  . thiamine  100 mg Oral Daily   Or  . thiamine  100 mg Intravenous Daily  . triamcinolone 0.1 % cream : eucerin  1 application Topical TID   Continuous:   Assessment/Plan: 1) Anemia. 2) Abnormal liver enzymes.   The patient is stable.  Renal function has almost normalized.  INR at 1.25 on coumadin.  Liver enzymes markedly improved and I feel that it is multifactorial, baseline hepatic steatosis and transient shock liver.  Plan: 1) EGD/Colonoscopy tomorrow. 2) Hold coumadin.  LOS: 7 days   Trayon Krantz D 01/23/2013, 7:58 AM  

## 2013-01-24 ENCOUNTER — Encounter (HOSPITAL_COMMUNITY)
Admission: EM | Disposition: A | Discharge status: Home / Self Care | Payer: Self-pay | Source: Home / Self Care | Attending: Cardiology

## 2013-01-24 ENCOUNTER — Encounter (HOSPITAL_COMMUNITY): Payer: Self-pay | Admitting: *Deleted

## 2013-01-24 DIAGNOSIS — I10 Essential (primary) hypertension: Secondary | ICD-10-CM

## 2013-01-24 DIAGNOSIS — I509 Heart failure, unspecified: Secondary | ICD-10-CM

## 2013-01-24 DIAGNOSIS — I2510 Atherosclerotic heart disease of native coronary artery without angina pectoris: Secondary | ICD-10-CM

## 2013-01-24 DIAGNOSIS — D126 Benign neoplasm of colon, unspecified: Secondary | ICD-10-CM

## 2013-01-24 DIAGNOSIS — K633 Ulcer of intestine: Secondary | ICD-10-CM

## 2013-01-24 HISTORY — PX: COLONOSCOPY: SHX5424

## 2013-01-24 HISTORY — PX: ESOPHAGOGASTRODUODENOSCOPY: SHX5428

## 2013-01-24 LAB — CBC
Hemoglobin: 10.7 g/dL — ABNORMAL LOW (ref 13.0–17.0)
Platelets: 269 10*3/uL (ref 150–400)
RBC: 3.53 MIL/uL — ABNORMAL LOW (ref 4.22–5.81)
WBC: 9.8 10*3/uL (ref 4.0–10.5)

## 2013-01-24 LAB — PROTIME-INR
INR: 1.14 (ref 0.00–1.49)
Prothrombin Time: 14.4 seconds (ref 11.6–15.2)

## 2013-01-24 LAB — COMPREHENSIVE METABOLIC PANEL
Albumin: 2.6 g/dL — ABNORMAL LOW (ref 3.5–5.2)
BUN: 17 mg/dL (ref 6–23)
CO2: 29 mEq/L (ref 19–32)
Calcium: 9.1 mg/dL (ref 8.4–10.5)
Chloride: 99 mEq/L (ref 96–112)
Creatinine, Ser: 1.41 mg/dL — ABNORMAL HIGH (ref 0.50–1.35)
GFR calc non Af Amer: 51 mL/min — ABNORMAL LOW (ref >90)
Total Bilirubin: 1.7 mg/dL — ABNORMAL HIGH (ref 0.3–1.2)

## 2013-01-24 LAB — GLUCOSE, CAPILLARY: Glucose-Capillary: 84 mg/dL (ref 70–99)

## 2013-01-24 SURGERY — COLONOSCOPY
Anesthesia: Moderate Sedation

## 2013-01-24 MED ORDER — FENTANYL CITRATE 0.05 MG/ML IJ SOLN
INTRAMUSCULAR | Status: AC
  Filled 2013-01-24: qty 4

## 2013-01-24 MED ORDER — BUTAMBEN-TETRACAINE-BENZOCAINE 2-2-14 % EX AERO
INHALATION_SPRAY | CUTANEOUS | Status: DC | PRN
  Administered 2013-01-24: 2 via TOPICAL

## 2013-01-24 MED ORDER — MIDAZOLAM HCL 10 MG/2ML IJ SOLN
INTRAMUSCULAR | Status: DC | PRN
  Administered 2013-01-24: 1 mg via INTRAVENOUS
  Administered 2013-01-24: 2 mg via INTRAVENOUS
  Administered 2013-01-24 (×3): 1 mg via INTRAVENOUS

## 2013-01-24 MED ORDER — FENTANYL CITRATE 0.05 MG/ML IJ SOLN
INTRAMUSCULAR | Status: DC | PRN
  Administered 2013-01-24 (×3): 25 ug via INTRAVENOUS

## 2013-01-24 MED ORDER — MIDAZOLAM HCL 5 MG/ML IJ SOLN
INTRAMUSCULAR | Status: AC
  Filled 2013-01-24: qty 2

## 2013-01-24 NOTE — H&P (View-Only) (Signed)
Subjective: No acute events.  Feels well.  Objective: Vital signs in last 24 hours: Temp:  [97.8 F (36.6 C)-98.1 F (36.7 C)] 98.1 F (36.7 C) (05/23 0544) Pulse Rate:  [78-81] 78 (05/23 0544) Resp:  [20] 20 (05/23 0544) BP: (119-126)/(66-74) 119/67 mmHg (05/23 0544) SpO2:  [96 %-99 %] 99 % (05/23 0544) Weight:  [209 lb 10.5 oz (95.1 kg)] 209 lb 10.5 oz (95.1 kg) (05/23 0544) Last BM Date: 01/21/13  Intake/Output from previous day: 05/22 0701 - 05/23 0700 In: 823 [P.O.:820; I.V.:3] Out: 3100 [Urine:3100] Intake/Output this shift: Total I/O In: -  Out: 550 [Urine:550]  General appearance: alert and no distress GI: soft, non-tender; bowel sounds normal; no masses,  no organomegaly  Lab Results:  Recent Labs  01/21/13 0630 01/22/13 0600 01/23/13 0540  WBC 9.3 9.0 8.8  HGB 9.7* 9.1* 9.4*  HCT 27.5* 26.6* 27.5*  PLT 230 223 254   BMET  Recent Labs  01/21/13 0630 01/22/13 0600 01/23/13 0540  NA 137 138 138  K 4.2 3.7 4.0  CL 99 98 99  CO2 27 29 30   GLUCOSE 111* 119* 106*  BUN 29* 28* 24*  CREATININE 2.66* 1.88* 1.60*  CALCIUM 8.3* 8.3* 8.4   LFT  Recent Labs  01/23/13 0540  PROT 5.7*  ALBUMIN 2.2*  AST 52*  ALT 86*  ALKPHOS 277*  BILITOT 0.9   PT/INR  Recent Labs  01/22/13 0600 01/23/13 0540  LABPROT 15.5* 15.0  INR 1.25 1.20   Hepatitis Panel No results found for this basename: HEPBSAG, HCVAB, HEPAIGM, HEPBIGM,  in the last 72 hours C-Diff No results found for this basename: CDIFFTOX,  in the last 72 hours Fecal Lactopherrin No results found for this basename: FECLLACTOFRN,  in the last 72 hours  Studies/Results: No results found.  Medications:  Scheduled: . aspirin EC  81 mg Oral Daily  . carvedilol  25 mg Oral BID WC  . dorzolamide-timolol  1 drop Both Eyes QHS  . feeding supplement  237 mL Oral BID BM  . folic acid  1 mg Oral Daily  . furosemide  40 mg Intravenous Q8H  . hydrALAZINE  12.5 mg Oral BID  . hydrocortisone  cream  1 application Topical BID  . isosorbide mononitrate  90 mg Oral Daily  . latanoprost  1 drop Both Eyes QHS  . multivitamin with minerals  1 tablet Oral Daily  . pantoprazole  40 mg Oral Q0600  . sodium chloride  3 mL Intravenous Q12H  . sodium polystyrene  30 g Oral Once  . thiamine  100 mg Oral Daily   Or  . thiamine  100 mg Intravenous Daily  . triamcinolone 0.1 % cream : eucerin  1 application Topical TID   Continuous:   Assessment/Plan: 1) Anemia. 2) Abnormal liver enzymes.   The patient is stable.  Renal function has almost normalized.  INR at 1.25 on coumadin.  Liver enzymes markedly improved and I feel that it is multifactorial, baseline hepatic steatosis and transient shock liver.  Plan: 1) EGD/Colonoscopy tomorrow. 2) Hold coumadin.  LOS: 7 days   Ree Alcalde D 01/23/2013, 7:58 AM

## 2013-01-24 NOTE — Op Note (Signed)
[facility] [address] [place], [address]   COLONOSCOPY PROCEDURE REPORT  PATIENT: Devin Williams, Devin Williams  MR#: [id number] BIRTHDATE: [birth year] , 66  yrs. old GENDER: Male ENDOSCOPIST: Hart Carwin, MD REFERRED BY:Dr Bryan Lemma, Dr Demetrius Charity.Hung PROCEDURE DATE:  01/24/2013 PROCEDURE:   Colonoscopy with cold biopsy polypectomy and Colonoscopy with snare polypectomy ASA CLASS:   Class III INDICATIONS:Iron Deficiency Anemia. MEDICATIONS: These medications were titrated to patient response per physician's verbal order, Versed 1mg  IV, and Fentanyl 25 mcg IV  DESCRIPTION OF PROCEDURE:   After the risks benefits and alternatives of the procedure were thoroughly explained, informed consent was obtained.  A digital rectal exam revealed no abnormalities of the rectum.   The Pentax Ped Colon [id number] endoscope was introduced through the anus and advanced to the cecum, which was identified by both the appendix and ileocecal valve. No adverse events experienced.   The quality of the prep was good, using MoviPrep  The instrument was then slowly withdrawn as the colon was fully examined.      COLON FINDINGS: Four smooth sessile polyps ranging between 5-54mm in size were found in the descending colon mat 70 cm x1 and at the cecum.x3   A polypectomy was performed with cold forceps and using snare cautery.  The resection was complete and the polyp tissue was completely retrieved.   A medium sized ulcer was found at the cecum.on the ileocecal valve 3cm in diametyer, shallow, benign appearing,  A biopsy of the area was performed using cold forceps. There was moderate diverticulosis noted in the sigmoid colon and throughout the entire examined colon with associated tortuosity. Retroflexed views revealed no abnormalities. The time to cecum=  . Withdrawal time=  .  The scope was withdrawn and the procedure completed. COMPLICATIONS: There were no  complications.  ENDOSCOPIC IMPRESSION: 1.   Four sessile polyps ranging between 5-9mm in size were found in the descending colon and at the cecum; polypectomy was performed with cold forceps and using snare cautery 2.   Ulcer at the cecum; biopsy of the area was performed using cold forceps 3.   There was moderate diverticulosis noted in the sigmoid colon and throughout the entire examined colon bleeding likely caused by cecal ulcer , ethiology not clear, possibly ischemic or med related, it shows no stigmata of recent bleeding RECOMMENDATIONS: Await biopsy results resume diet,  Recall colonoscopy pending pathology report   eSigned:  Hart Carwin, MD 01/24/2013 10:12 AM   cc:   PATIENT NAME:  Devin Williams, Devin Williams MR#: [id number]

## 2013-01-24 NOTE — Interval H&P Note (Signed)
History and Physical Interval Note:  01/24/2013 8:53 AM  Devin Williams  has presented today for surgery, with the diagnosis of Anemia  The various methods of treatment have been discussed with the patient and family. After consideration of risks, benefits and other options for treatment, the patient has consented to  Procedure(s): COLONOSCOPY (N/A) ESOPHAGOGASTRODUODENOSCOPY (EGD) (N/A) as a surgical intervention .  The patient's history has been reviewed, patient examined, no change in status, stable for surgery.  I have reviewed the patient's chart and labs.  Questions were answered to the patient's satisfaction.     Lina Sar

## 2013-01-24 NOTE — Op Note (Signed)
[facility] [address] [place], [address]   ENDOSCOPY PROCEDURE REPORT  PATIENT: Devin Williams, Devin Williams  MR#: [id number] BIRTHDATE: 1946-02-01 , 66  yrs. old GENDER: Male ENDOSCOPIST: Hart Carwin, MD REFERRED BY:  Dr Bryan Lemma, Dr Jeani Hawking PROCEDURE DATE:  01/24/2013 PROCEDURE:  EGD w/ biopsy ASA CLASS:     Class III INDICATIONS:  Iron deficiency anemia. MEDICATIONS: These medications were titrated to patient response per physician's verbal order, Versed 5 mg IV, and Fentanyl 50 mcg IV TOPICAL ANESTHETIC: Cetacaine Spray  DESCRIPTION OF PROCEDURE: After the risks benefits and alternatives of the procedure were thoroughly explained, informed consent was obtained.  The    endoscope was introduced through the mouth and advanced to the second portion of the duodenum. Without limitations.  The instrument was slowly withdrawn as the mucosa was fully examined.        ESOPHAGUS: A 3 cm hiatal hernia was noted.  , partially reducible, No Cameron erosions , irregular z-line, biopsies taken to r/o Barrett's esophagus  STOMACH: The mucosa of the stomach appeared normal.  DUODENUM: The duodenal mucosa showed no abnormalities.  Retroflexed views revealed no abnormalities.     The scope was then withdrawn from the patient and the procedure completed.  COMPLICATIONS: There were no complications. ENDOSCOPIC IMPRESSION: 1.   3 cm hiatal hernia 2.   The mucosa of the stomach appeared normal 3.   The duodenal mucosa showed no abnormalities  nothing to account for GIB RECOMMENDATIONS: Await pathology results colonoscopy  REPEAT EXAM: for EGD pending biopsy results.  eSigned:  Hart Carwin, MD 01/24/2013 10:04 AM   CC:  PATIENT NAME:  Devin Williams, Devin Williams MR#: [id number]

## 2013-01-24 NOTE — Progress Notes (Signed)
[facility]   Subjective:   Objective: Vital signs in last 24 hours: Temp:  [97.3 F (36.3 C)-98.8 F (37.1 C)] 98.4 F (36.9 C) (05/24 1018) Pulse Rate:  [74-83] 81 (05/24 0426) Resp:  [10-22] 18 (05/24 1030) BP: (92-140)/(53-104) 127/75 mmHg (05/24 1030) SpO2:  [96 %-100 %] 100 % (05/24 1030) Weight:  [205 lb 11 oz (93.3 kg)] 205 lb 11 oz (93.3 kg) (05/24 0426) Weight change: -3 lb 15.5 oz (-1.8 kg) Last BM Date: 01/21/13 Intake/Output from previous day: -580 wt 205 down from 217 on admit  (-1659 since admit) 05/23 0701 - 05/24 0700 In: 1320 [P.O.:1320] Out: 1900 [Urine:1900] Intake/Output this shift:    PE: General:Pleasant affect, NAD Neck:supple, no JVD, no bruits  Heart:S1S2 RRR without murmur, gallup, rub or click Lungs:clear without rales, rhonchi, or wheezes ZOX:WRUE, non tender, + BS, do not palpate liver spleen or masses AVW:UJWJX to 1+, 2+ pedal pulses, 2+ radial pulses    Lab Results:  Recent Labs  01/23/13 0540 01/24/13 0430  WBC 8.8 9.8  HGB 9.4* 10.7*  HCT 27.5* 31.9*  PLT 254 269   BMET  Recent Labs  01/23/13 0540 01/24/13 0430  NA 138 138  K 4.0 4.6  CL 99 99  CO2 30 29  GLUCOSE 106* 85  BUN 24* 17  CREATININE 1.60* 1.41*  CALCIUM 8.4 9.1   No results found for this basename: TROPONINI, CK, MB,  in the last 72 hours  No results found for this basename: CHOL,  HDL,  LDLCALC,  LDLDIRECT,  TRIG,  CHOLHDL   No results found for this basename: HGBA1C     Lab Results  Component Value Date   TSH 0.331* 11/29/2012    Hepatic Function Panel  Recent Labs  01/24/13 0430  PROT 6.6  ALBUMIN 2.6*  AST 41*  ALT 74*  ALKPHOS 270*  BILITOT 1.7*     Studies/Results: Colonoscopy today:  Four sessile polyps ranging between 5-38mm in size were found in  the descending colon and at the cecum; polypectomy was performed  with cold forceps and using snare cautery  2. Ulcer at the cecum; biopsy of the area was  performed using cold  forceps  3. There was moderate diverticulosis noted in the sigmoid colon  and throughout the entire examined colon  bleeding likely caused by cecal ulcer , ethiology not clear,  possibly ischemic or med related, it shows no stigmata of recent  bleeding   EGD- done Today 1. 3 cm hiatal hernia  2. The mucosa of the stomach appeared normal  3. The duodenal mucosa showed no abnormalities  nothing to account for GIB   Medications: I have reviewed the patient's current medications. Scheduled Meds: . aspirin EC  81 mg Oral Daily  . carvedilol  25 mg Oral BID WC  . dorzolamide-timolol  1 drop Both Eyes QHS  . feeding supplement  237 mL Oral BID BM  . folic acid  1 mg Oral Daily  . furosemide  60 mg Oral BID  . hydrALAZINE  25 mg Oral BID  . hydrocortisone cream  1 application Topical BID  . isosorbide mononitrate  90 mg Oral Daily  . latanoprost  1 drop Both Eyes QHS  . multivitamin with minerals  1 tablet Oral Daily  . pantoprazole  40 mg Oral Q0600  . sodium chloride  3 mL Intravenous Q12H  . sodium polystyrene  30 g Oral Once  . thiamine  100  mg Oral Daily   Or  . thiamine  100 mg Intravenous Daily  . 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 Last cath 2008 - med Rx. Negative Myoview 5/12   Alcoholism   HTN (hypertension)   Hyperlipidemia   Acute on chronic combined systolic and diastolic heart failure   Psoriasis   Transaminitis   Hyperkalemia   Hypoalbuminemia   Chronic anticoagulation   Cardiomyopathy, ischemic - EF 45-50% March 2014   Anemia, transfused 1 unit PRBCs 01/22/13   Benign neoplasm of colon   Ulcer of intestine  PLAN:  Lasix changed to po yesterday- off lipid meds due to liver failure, though this is improving.  ETOH counseling I asked SW to see- no note yet.  Will also send to care manager.  Polypectomy today   Was on coumadin for bilateral DVT beginning 11/2012, ? Able to resume   LOS: 8 days   Time spent with pt. :20 minutes, including MD time. [facility] R  Nurse Practitioner Certified Pager [phone] 01/24/2013, 1:51 PM    Patient seen and examined. Agree with assessment and plan. Results of EGD and colonoscopy noted. Coumadin presently on hold. Hb 10.7 today; s/p PRBC tx.  No chest pain or sob.    Lennette Bihari, MD, [facility], [place] 01/24/2013 1:53 PM

## 2013-01-24 NOTE — Significant Event (Addendum)
Devin Williams called regarding low b/p 80/33 at 1430 rt arm ,101/56 hr 82 on lt arm at 1500 , no acute distress noted at this time will continue to monitor pt.

## 2013-01-24 NOTE — Progress Notes (Signed)
Pt refusing to drink bowel prep overnight, pt BM not clear. MD notified with no new orders. Baron Hamper, RN 01/24/2013 4:57 AM

## 2013-01-24 NOTE — Progress Notes (Signed)
OK to resume Coumadin from GI standpoint. Will sign off.

## 2013-01-25 DIAGNOSIS — I10 Essential (primary) hypertension: Secondary | ICD-10-CM

## 2013-01-25 LAB — COMPREHENSIVE METABOLIC PANEL
AST: 28 U/L (ref 0–37)
Albumin: 2.3 g/dL — ABNORMAL LOW (ref 3.5–5.2)
Calcium: 8.6 mg/dL (ref 8.4–10.5)
Creatinine, Ser: 1.36 mg/dL — ABNORMAL HIGH (ref 0.50–1.35)
Total Protein: 6 g/dL (ref 6.0–8.3)

## 2013-01-25 LAB — CBC
Hemoglobin: 9.7 g/dL — ABNORMAL LOW (ref 13.0–17.0)
MCH: 29.8 pg (ref 26.0–34.0)
MCV: 89.8 fL (ref 78.0–100.0)
RBC: 3.25 MIL/uL — ABNORMAL LOW (ref 4.22–5.81)

## 2013-01-25 MED ORDER — WARFARIN SODIUM 5 MG PO TABS
5.0000 mg | ORAL_TABLET | Freq: Once | ORAL | Status: AC
  Administered 2013-01-25: 5 mg via ORAL
  Filled 2013-01-25: qty 1

## 2013-01-25 MED ORDER — WARFARIN - PHARMACIST DOSING INPATIENT
Freq: Every day | Status: DC
  Administered 2013-01-31: 18:00:00

## 2013-01-25 NOTE — Progress Notes (Signed)
[facility]   Subjective: Taking his monitor off frequently per nurse,  Pt has no complaints except itching with psoriasis,  Stated he was not going to drink alcohol.  Complains of not enough to drink here in hospital.   Objective: Vital signs in last 24 hours: Temp:  [97.9 F (36.6 C)-99.1 F (37.3 C)] 99 F (37.2 C) (05/25 0500) Pulse Rate:  [75-93] 90 (05/25 0500) Resp:  [10-19] 18 (05/25 0500) BP: (80-137)/(33-78) 137/78 mmHg (05/25 0500) SpO2:  [97 %-100 %] 100 % (05/25 0500) Weight:  [204 lb 12.9 oz (92.9 kg)] 204 lb 12.9 oz (92.9 kg) (05/25 0500) Weight change: -14.1 oz (-0.4 kg) Last BM Date: 01/23/13 Intake/Output from previous day: -128 (total out -1787)  Wt 204 down from 217. 05/24 0701 - 05/25 0700 In: 922 [P.O.:922] Out: 1050 [Urine:1050] Intake/Output this shift:    PE: General:Pleasant affect, NAD Heart:S1S2 RRR without murmur, gallup, rub or click Lungs:clear without rales, rhonchi, or wheezes ZOX:WRUE, non tender, + BS, do not palpate liver spleen or masses Ext:no lower ext edema,     Tele: SR   Lab Results:  Recent Labs  01/24/13 0430 01/25/13 0445  WBC 9.8 8.3  HGB 10.7* 9.7*  HCT 31.9* 29.2*  PLT 269 257   BMET  Recent Labs  01/24/13 0430 01/25/13 0445  NA 138 138  K 4.6 4.0  CL 99 103  CO2 29 27  GLUCOSE 85 101*  BUN 17 17  CREATININE 1.41* 1.36*  CALCIUM 9.1 8.6     Hepatic Function Panel  Recent Labs  01/25/13 0445  PROT 6.0  ALBUMIN 2.3*  AST 28  ALT 53  ALKPHOS 221*  BILITOT 1.0    Studies/Results: No results found.  Medications: I have reviewed the patient's current medications. Scheduled Meds: . aspirin EC  81 mg Oral Daily  . carvedilol  25 mg Oral BID WC  . dorzolamide-timolol  1 drop Both Eyes QHS  . feeding supplement  237 mL Oral BID BM  . folic acid  1 mg Oral Daily  . furosemide  60 mg Oral BID  . hydrALAZINE  25 mg Oral BID  . hydrocortisone cream  1 application  Topical BID  . isosorbide mononitrate  90 mg Oral Daily  . latanoprost  1 drop Both Eyes QHS  . multivitamin with minerals  1 tablet Oral Daily  . pantoprazole  40 mg Oral Q0600  . sodium chloride  3 mL Intravenous Q12H  . sodium polystyrene  30 g Oral Once  . thiamine  100 mg Oral Daily  . 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 Last cath 2008 - med Rx. Negative Myoview 5/12   Alcoholism   HTN (hypertension)   Hyperlipidemia   Acute on chronic combined systolic and diastolic heart failure   Psoriasis   Transaminitis   Hyperkalemia   Hypoalbuminemia   Chronic anticoagulation   Cardiomyopathy, ischemic - EF 45-50% March 2014   Anemia, transfused 1 unit PRBCs 01/22/13   Benign neoplasm of colon   Ulcer of intestine  PLAN: will resume coumadin, if recurrent bleeding ? IVC filter  Cr. Stable continues to decrease.  H/H slow drift down.  ambulate   LOS: 9 days   Time spent with pt. :15 minutes. [facility] R  Nurse Practitioner Certified Pager [phone] 01/25/2013, 9:50 AM

## 2013-01-25 NOTE — Progress Notes (Addendum)
Pt. Seen and examined. Agree with the NP/PA-C note as written. Exam also includes - improved skin with less sloughing, brief neuro exam WNL with mild exotropia, mood and affect normal - etoh abuser. Ok to resume warfarin per GI - heparin bridging is not recommended due to recent anemia and concern for bleeding. He is not interested in outpatient alcohol treatment - says he has done AA and is okay on his own. Liver enzymes have normalized. Creatinine continues to improve. Close to discharge - would like therapeutic INR prior to d/c.  Chrystie Nose, MD, [facility] Attending Cardiologist [facility]

## 2013-01-25 NOTE — Progress Notes (Signed)
ANTICOAGULATION CONSULT NOTE - Initial Consult  Pharmacy Consult for warfarin Indication: bilateral DVT  Allergies  Allergen Reactions  . Penicillins     Patient Measurements: Height: 5\' 9"  (175.3 cm) Weight: 204 lb 12.9 oz (92.9 kg) (SCALE b) IBW/kg (Calculated) : 70.7  Vital Signs: Temp: 99 F (37.2 C) (05/25 0500) Temp src: Oral (05/25 0500) BP: 137/78 mmHg (05/25 0500) Pulse Rate: 90 (05/25 0500)  Labs:  Recent Labs  01/23/13 0540 01/24/13 0430 01/25/13 0445  HGB 9.4* 10.7* 9.7*  HCT 27.5* 31.9* 29.2*  PLT 254 269 257  LABPROT 15.0 14.4 14.4  INR 1.20 1.14 1.14  CREATININE 1.60* 1.41* 1.36*    Estimated Creatinine Clearance: 61 ml/min (by C-G formula based on Cr of 1.36).   Medical History: Past Medical History  Diagnosis Date  . Hypertension   . DVT (deep venous thrombosis) 11/2012    bilateral  . Psoriasis   . Chronic kidney disease     CKD 3  . Coronary artery disease 01/16/2011    R/P MV - some attenuation artiface in inferior region, no ischemia or infarct/scar seen in remaining myocardium; notwithstanding pts hx of CAD and CABG findings are more consistent w. non-ischemic cardiomyopathy; balanced ischemia cannot be ruled out;  no significant change from previous study 11/2008  . Hyperlipidemia   . Alcohol abuse, daily use   . Diastolic dysfunction   . GERD (gastroesophageal reflux disease)   . Optic nerve ischemia   . Problems with hearing   . Vision problems   . SOB (shortness of breath) 11/18/2012    [facility]cho at [facility]   . Pain in limb 11/29/2012    LE venous duplex done at [facility]  . Carotid bruit 03/20/2007    Carotid doppler - R & L bulbs and proximal ICA -  irregular nonhemodynamically significant plaque of 0-49% diameter reduction  . Edema   . Hypoalbuminemia   . Sinus tachycardia   . Cardiac angina   . Hx-TIA (transient ischemic attack)   . Alcohol abuse     Medications:  Prescriptions prior to admission  Medication Sig Dispense Refill  .  acitretin (SORIATANE) 25 MG capsule Take 25 mg by mouth daily before breakfast.      . aspirin EC 81 MG tablet Take 81 mg by mouth daily.      [name] atorvastatin (LIPITOR) 20 MG tablet Take 20 mg by mouth at bedtime.      . carvedilol (COREG) 25 MG tablet Take 1 tablet (25 mg total) by mouth 2 (two) times daily with a meal.  62 tablet  0  . Choline Fenofibrate (FENOFIBRIC ACID) 135 MG CPDR Take 1 capsule by mouth daily.      . dorzolamide-timolol (COSOPT) 22.3-6.8 MG/ML ophthalmic solution Place 1 drop into both eyes at bedtime.      . fenofibrate 160 MG tablet Take 160 mg by mouth daily.      . furosemide (LASIX) 20 MG tablet Take 20 mg by mouth daily.      . hydrocortisone cream 0.5 % Apply 1 application topically 2 (two) times daily.      . hydrOXYzine (ATARAX/VISTARIL) 10 MG tablet Take 10 mg by mouth 3 (three) times daily as needed for itching.      . isosorbide mononitrate (IMDUR) 60 MG 24 hr tablet Take 60 mg by mouth daily.      [name] latanoprost (XALATAN) 0.005 % ophthalmic solution 1 drop at bedtime.      . Multiple Vitamin (  MULTIVITAMIN WITH MINERALS) TABS Take 1 tablet by mouth daily.      . nitroGLYCERIN (NITROSTAT) 0.4 MG SL tablet Place 1 tablet (0.4 mg total) under the tongue every 5 (five) minutes as needed for chest pain (hold for SBP < 110).  20 tablet  0  . oxyCODONE (OXY IR/ROXICODONE) 5 MG immediate release tablet Take 1 tablet (5 mg total) by mouth every 4 (four) hours as needed.  15 tablet  0  . potassium chloride (K-DUR) 10 MEQ tablet Take 10 mEq by mouth daily.      . Triamcinolone Acetonide (TRIAMCINOLONE 0.1 % CREAM : EUCERIN) CREA Apply 1 application topically 3 (three) times daily.  1 each  0  . warfarin (COUMADIN) 5 MG tablet Take 5 mg by mouth daily.        Assessment: 80 y/oM with recent history of bilateral DVTs on Coumadin PTA.  Home dose was 5 mg daily. INR on admit was 2.77, but Coumadin has been on hold d/t GI bleed. Now s/p EGD and colonoscopy, GI has given ok to  resume Coumadin.  INR today is 1.14, subtherapeutic. CBC stable.   Goal of Therapy:  INR 2-3 Monitor platelets by anticoagulation protocol: Yes   Plan:  - Coumadin 5 mg x1 - Daily INR - Monitor for bleeding

## 2013-01-26 DIAGNOSIS — I5043 Acute on chronic combined systolic (congestive) and diastolic (congestive) heart failure: Secondary | ICD-10-CM

## 2013-01-26 LAB — CBC
MCHC: 33.3 g/dL (ref 30.0–36.0)
MCV: 91.4 fL (ref 78.0–100.0)
Platelets: 222 10*3/uL (ref 150–400)
RDW: 17.1 % — ABNORMAL HIGH (ref 11.5–15.5)
WBC: 7.4 10*3/uL (ref 4.0–10.5)

## 2013-01-26 LAB — COMPREHENSIVE METABOLIC PANEL
ALT: 44 U/L (ref 0–53)
Albumin: 2.3 g/dL — ABNORMAL LOW (ref 3.5–5.2)
Alkaline Phosphatase: 195 U/L — ABNORMAL HIGH (ref 39–117)
BUN: 15 mg/dL (ref 6–23)
Chloride: 102 mEq/L (ref 96–112)
GFR calc Af Amer: 64 mL/min — ABNORMAL LOW (ref >90)
Glucose, Bld: 101 mg/dL — ABNORMAL HIGH (ref 70–99)
Potassium: 3.9 mEq/L (ref 3.5–5.1)
Sodium: 138 mEq/L (ref 135–145)
Total Bilirubin: 0.8 mg/dL (ref 0.3–1.2)

## 2013-01-26 MED ORDER — WARFARIN SODIUM 7.5 MG PO TABS
7.5000 mg | ORAL_TABLET | Freq: Once | ORAL | Status: AC
  Administered 2013-01-26: 7.5 mg via ORAL
  Filled 2013-01-26: qty 1

## 2013-01-26 NOTE — Progress Notes (Signed)
[facility]   Subjective: Not wearing monitor, will leave off  Objective: Vital signs in last 24 hours: Temp:  [98.2 F (36.8 C)-98.8 F (37.1 C)] 98.8 F (37.1 C) (05/26 0500) Pulse Rate:  [77-92] 77 (05/26 0500) Resp:  [18-20] 18 (05/26 0500) BP: (103-122)/(54-73) 103/54 mmHg (05/26 0500) SpO2:  [99 %-100 %] 100 % (05/26 0500) Weight:  [202 lb 6.1 oz (91.8 kg)] 202 lb 6.1 oz (91.8 kg) (05/26 0500) Weight change: -2 lb 6.8 oz (-1.1 kg) Last BM Date: 01/23/13 Intake/Output from previous day: -325 ( total  -2112)  Wt 202 down from 217 05/25 0701 - 05/26 0700 In: 1900 [P.O.:1900] Out: 2225 [Urine:2225] Intake/Output this shift: Total I/O In: 240 [P.O.:240] Out: -   PE: General:Pleasant affect, NAD Skin:Warm and dry, brisk capillary refill, psoriasis though improved Neck:supple, no JVD, no bruits  Heart:S1S2 RRR  1/6 sem Lungs:clear without rales, rhonchi, or wheezes ZOX:WRUE, non tender, + BS, do not palpate liver spleen or masses Ext:no lower ext edema; scaling Neuro:alert and oriented, MAE, follows commands, + facial symmetry  TELE:  Not wearing. Lab Results:  Recent Labs  01/25/13 0445 01/26/13 0427  WBC 8.3 7.4  HGB 9.7* 9.6*  HCT 29.2* 28.8*  PLT 257 222   BMET  Recent Labs  01/25/13 0445 01/26/13 0427  NA 138 138  K 4.0 3.9  CL 103 102  CO2 27 27  GLUCOSE 101* 101*  BUN 17 15  CREATININE 1.36* 1.31  CALCIUM 8.6 8.5      Lab Results  Component Value Date   TSH 0.331* 11/29/2012    Hepatic Function Panel  Recent Labs  01/26/13 0427  PROT 6.0  ALBUMIN 2.3*  AST 24  ALT 44  ALKPHOS 195*  BILITOT 0.8  Studies/Results: No results found.  Medications: I have reviewed the patient's current medications. Scheduled Meds: . aspirin EC  81 mg Oral Daily  . carvedilol  25 mg Oral BID WC  . dorzolamide-timolol  1 drop Both Eyes QHS  . feeding supplement  237 mL Oral BID BM  . folic acid  1 mg Oral Daily  .  furosemide  60 mg Oral BID  . hydrALAZINE  25 mg Oral BID  . hydrocortisone cream  1 application Topical BID  . isosorbide mononitrate  90 mg Oral Daily  . latanoprost  1 drop Both Eyes QHS  . multivitamin with minerals  1 tablet Oral Daily  . pantoprazole  40 mg Oral Q0600  . sodium chloride  3 mL Intravenous Q12H  . sodium polystyrene  30 g Oral Once  . thiamine  100 mg Oral Daily  . triamcinolone 0.1 % cream : eucerin  1 application Topical TID  . 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 Last cath 2008 - med Rx. Negative Myoview 5/12   Alcoholism   HTN (hypertension)   Hyperlipidemia   Psoriasis   Transaminitis   Hyperkalemia   Hypoalbuminemia   Chronic anticoagulation   Cardiomyopathy, ischemic - EF 45-50% March 2014   Benign neoplasm of colon   Ulcer  of intestine  PLAN: INR 1.11, yesterday first dose of coumadin, no crossover secondary to recent GI bleed.  H/H stable, LFTs now normal.  Discussed alcohol, does not want any counseling. Keep and monitor for bleeding with addition of coumadin for bil DVTs.  If bleeding occurs would then need [place] filter.   LOS: 10 days   Time spent with pt. :20 minutes including MD time.Leone Brand  Nurse Practitioner Certified Pager [phone] 01/26/2013, 8:53 AM     Patient seen and examined. Agree with assessment and plan. Breathing well.Coumadin resumed with h/o bilateral DVT; INR today 1.11.  No recurrent GI bleeding. As per Dr. Rennis Golden will monitor in hospital until INR therapeutic.   Lennette Bihari, MD,  [facility] 01/26/2013 9:09 AM  v

## 2013-01-26 NOTE — Progress Notes (Signed)
ANTICOAGULATION CONSULT NOTE - Initial Consult  Pharmacy Consult for warfarin Indication: bilateral DVT  Allergies  Allergen Reactions  . Penicillins     Patient Measurements: Height: 5\' 9"  (175.3 cm) Weight: 202 lb 6.1 oz (91.8 kg) (SCALE B) IBW/kg (Calculated) : 70.7  Vital Signs: Temp: 98.8 F (37.1 C) (05/26 0500) Temp src: Oral (05/26 0500) BP: 105/60 mmHg (05/26 0918) Pulse Rate: 77 (05/26 0500)  Labs:  Recent Labs  01/24/13 0430 01/25/13 0445 01/26/13 0427  HGB 10.7* 9.7* 9.6*  HCT 31.9* 29.2* 28.8*  PLT 269 257 222  LABPROT 14.4 14.4 14.2  INR 1.14 1.14 1.11  CREATININE 1.41* 1.36* 1.31    Estimated Creatinine Clearance: 62.9 ml/min (by C-G formula based on Cr of 1.31).   Medical History: Past Medical History  Diagnosis Date  . Hypertension   . DVT (deep venous thrombosis) 11/2012    bilateral  . Psoriasis   . Chronic kidney disease     CKD 3  . Coronary artery disease 01/16/2011    R/P MV - some attenuation artiface in inferior region, no ischemia or infarct/scar seen in remaining myocardium; notwithstanding pts hx of CAD and CABG findings are more consistent w. non-ischemic cardiomyopathy; balanced ischemia cannot be ruled out;  no significant change from previous study 11/2008  . Hyperlipidemia   . Alcohol abuse, daily use   . Diastolic dysfunction   . GERD (gastroesophageal reflux disease)   . Optic nerve ischemia   . Problems with hearing   . Vision problems   . SOB (shortness of breath) 11/18/2012    Echo at [facility]   . Pain in limb 11/29/2012    LE venous duplex done at [facility]  . Carotid bruit 03/20/2007    Carotid doppler - R & L bulbs and proximal ICA -  irregular nonhemodynamically significant plaque of 0-49% diameter reduction  . Edema   . Hypoalbuminemia   . Sinus tachycardia   . Cardiac angina   . Hx-TIA (transient ischemic attack)   . Alcohol abuse     Medications:  Prescriptions prior to admission  Medication Sig Dispense Refill  .  acitretin (SORIATANE) 25 MG capsule Take 25 mg by mouth daily before breakfast.      . aspirin EC 81 MG tablet Take 81 mg by mouth daily.      [name] atorvastatin (LIPITOR) 20 MG tablet Take 20 mg by mouth at bedtime.      . carvedilol (COREG) 25 MG tablet Take 1 tablet (25 mg total) by mouth 2 (two) times daily with a meal.  62 tablet  0  . Choline Fenofibrate (FENOFIBRIC ACID) 135 MG CPDR Take 1 capsule by mouth daily.      . dorzolamide-timolol (COSOPT) 22.3-6.8 MG/ML ophthalmic solution Place 1 drop into both eyes at bedtime.      . fenofibrate 160 MG tablet Take 160 mg by mouth daily.      . furosemide (LASIX) 20 MG tablet Take 20 mg by mouth daily.      . hydrocortisone cream 0.5 % Apply 1 application topically 2 (two) times daily.      . hydrOXYzine (ATARAX/VISTARIL) 10 MG tablet Take 10 mg by mouth 3 (three) times daily as needed for itching.      . isosorbide mononitrate (IMDUR) 60 MG 24 hr tablet Take 60 mg by mouth daily.      [name] latanoprost (XALATAN) 0.005 % ophthalmic solution 1 drop at bedtime.      . Multiple Vitamin (  MULTIVITAMIN WITH MINERALS) TABS Take 1 tablet by mouth daily.      . nitroGLYCERIN (NITROSTAT) 0.4 MG SL tablet Place 1 tablet (0.4 mg total) under the tongue every 5 (five) minutes as needed for chest pain (hold for SBP < 110).  20 tablet  0  . oxyCODONE (OXY IR/ROXICODONE) 5 MG immediate release tablet Take 1 tablet (5 mg total) by mouth every 4 (four) hours as needed.  15 tablet  0  . potassium chloride (K-DUR) 10 MEQ tablet Take 10 mEq by mouth daily.      . Triamcinolone Acetonide (TRIAMCINOLONE 0.1 % CREAM : EUCERIN) CREA Apply 1 application topically 3 (three) times daily.  1 each  0  . warfarin (COUMADIN) 5 MG tablet Take 5 mg by mouth daily.        Assessment: 19 y/oM with recent history of bilateral DVTs on Coumadin PTA.  Home dose was 5 mg daily. INR on admit was 2.77, but Coumadin has been on hold d/t GI bleed. Now s/p EGD and colonoscopy, GI gave ok to resume  Coumadin.  INR remains subtherapeutic, 1.11 after one 5 mg dose. CBC stable.   Goal of Therapy:  INR 2-3 Monitor platelets by anticoagulation protocol: Yes   Plan:  - Coumadin 7.5 mg x1 - Daily INR - Monitor for bleeding    Doris Cheadle, PharmD Clinical Pharmacist Pager: [phone] Phone: [phone] 01/26/2013 9:37 AM

## 2013-01-27 ENCOUNTER — Encounter: Payer: Self-pay | Admitting: Internal Medicine

## 2013-01-27 LAB — CBC
HCT: 27.8 % — ABNORMAL LOW (ref 39.0–52.0)
Hemoglobin: 9.3 g/dL — ABNORMAL LOW (ref 13.0–17.0)
RBC: 3.09 MIL/uL — ABNORMAL LOW (ref 4.22–5.81)
WBC: 7 10*3/uL (ref 4.0–10.5)

## 2013-01-27 LAB — PROTIME-INR
INR: 1.09 (ref 0.00–1.49)
Prothrombin Time: 14 seconds (ref 11.6–15.2)

## 2013-01-27 LAB — COMPREHENSIVE METABOLIC PANEL
ALT: 32 U/L (ref 0–53)
CO2: 25 mEq/L (ref 19–32)
Calcium: 8.6 mg/dL (ref 8.4–10.5)
Creatinine, Ser: 1.22 mg/dL (ref 0.50–1.35)
GFR calc Af Amer: 70 mL/min — ABNORMAL LOW (ref >90)
GFR calc non Af Amer: 61 mL/min — ABNORMAL LOW (ref >90)
Glucose, Bld: 96 mg/dL (ref 70–99)
Sodium: 137 mEq/L (ref 135–145)
Total Protein: 6 g/dL (ref 6.0–8.3)

## 2013-01-27 MED ORDER — WARFARIN SODIUM 7.5 MG PO TABS
7.5000 mg | ORAL_TABLET | Freq: Once | ORAL | Status: AC
  Administered 2013-01-27: 7.5 mg via ORAL
  Filled 2013-01-27: qty 1

## 2013-01-27 NOTE — Progress Notes (Signed)
ANTICOAGULATION CONSULT NOTE   Pharmacy Consult for warfarin Indication: bilateral DVT  Allergies  Allergen Reactions  . Penicillins    Labs:  Recent Labs  01/25/13 0445 01/26/13 0427 01/27/13 0720  HGB 9.7* 9.6* 9.3*  HCT 29.2* 28.8* 27.8*  PLT 257 222 221  LABPROT 14.4 14.2 14.0  INR 1.14 1.11 1.09  CREATININE 1.36* 1.31 1.22    Estimated Creatinine Clearance: 67.5 ml/min (by C-G formula based on Cr of 1.22).  Assessment: 83 y/oM with recent history of bilateral DVTs on Coumadin PTA.  Home dose was 5 mg daily. INR on admit was 2.77, but Coumadin has been on hold d/t GI bleed. Now s/p EGD and colonoscopy, GI gave ok to resume Coumadin.  INR remains subtherapeutic, 1.09.  CBC stable.   Goal of Therapy:  INR 2-3 Monitor platelets by anticoagulation protocol: Yes   Plan:  - Repeat Coumadin 7.5 mg x1 - Daily INR - Monitor for bleeding   Thank you. Okey Regal, PharmD [phone]  01/27/2013 8:42 AM

## 2013-01-27 NOTE — Progress Notes (Signed)
Utilization Review Completed.   Kadeidra Coryell, RN, BSN Nurse Case Manager  [phone]  

## 2013-01-27 NOTE — Progress Notes (Signed)
[facility]  Subjective: No complaints.   Objective: Vital signs in last 24 hours: Temp:  [98.1 F (36.7 C)-99.5 F (37.5 C)] 98.1 F (36.7 C) (05/27 0558) Pulse Rate:  [73-88] 73 (05/27 0900) Resp:  [18-19] 18 (05/27 0900) BP: (95-127)/(55-78) 113/78 mmHg (05/27 0900) SpO2:  [100 %] 100 % (05/27 0900) Weight:  [201 lb 9.6 oz (91.445 kg)] 201 lb 9.6 oz (91.445 kg) (05/27 0558) Last BM Date: 01/26/13  Intake/Output from previous day: 05/26 0701 - 05/27 0700 In: 1190 [P.O.:1190] Out: 1750 [Urine:1750] Intake/Output this shift: Total I/O In: 240 [P.O.:240] Out: -   Medications Current Facility-Administered Medications  Medication Dose Route Frequency Provider Last Rate Last Dose  . alum & mag hydroxide-simeth (MAALOX/MYLANTA) 200-200-20 MG/5ML suspension 30 mL  30 mL Oral Q6H PRN Ardis Rowan, MD   30 mL at 01/23/13 0307  . aspirin EC tablet 81 mg  81 mg Oral Daily Brittainy Simmons, PA-C   81 mg at 01/27/13 0947  . carvedilol (COREG) tablet 25 mg  25 mg Oral BID WC Cecille Aver, MD   25 mg at 01/27/13 0643  . dorzolamide-timolol (COSOPT) 22.3-6.8 MG/ML ophthalmic solution 1 drop  1 drop Both Eyes QHS Brittainy Simmons, PA-C   1 drop at 01/26/13 2120  . feeding supplement (ENSURE COMPLETE) liquid 237 mL  237 mL Oral BID BM Chrystie Nose, MD   237 mL at 01/27/13 0955  . folic acid (FOLVITE) tablet 1 mg  1 mg Oral Daily Chrystie Nose, MD   1 mg at 01/27/13 0947  . furosemide (LASIX) tablet 60 mg  60 mg Oral BID Marykay Lex, MD   60 mg at 01/27/13 0806  . hydrALAZINE (APRESOLINE) tablet 25 mg  25 mg Oral BID Marykay Lex, MD   25 mg at 01/27/13 0948  . hydrocortisone cream 1 % 1 application  1 application Topical BID Robbie Lis, PA-C   1 application at 01/27/13 0949  . hydrOXYzine (ATARAX/VISTARIL) tablet 10 mg  10 mg Oral TID PRN Robbie Lis, PA-C   10 mg at 01/27/13 0809  . isosorbide mononitrate (IMDUR) 24 hr  tablet 90 mg  90 mg Oral Daily Brittainy Simmons, PA-C   90 mg at 01/27/13 0947  . latanoprost (XALATAN) 0.005 % ophthalmic solution 1 drop  1 drop Both Eyes QHS Brittainy Simmons, PA-C   1 drop at 01/26/13 2124  . multivitamin with minerals tablet 1 tablet  1 tablet Oral Daily Brittainy Simmons, PA-C   1 tablet at 01/27/13 0947  . nitroGLYCERIN (NITROSTAT) SL tablet 0.4 mg  0.4 mg Sublingual Q5 min PRN Brittainy Simmons, PA-C   0.4 mg at 01/19/13 1537  . ondansetron (ZOFRAN) tablet 4 mg  4 mg Oral Q6H PRN Brittainy Simmons, PA-C       Or  . ondansetron (ZOFRAN) injection 4 mg  4 mg Intravenous Q6H PRN Brittainy Simmons, PA-C      . oxyCODONE (Oxy IR/ROXICODONE) immediate release tablet 5 mg  5 mg Oral Q4H PRN Brittainy Simmons, PA-C   5 mg at 01/25/13 2100  . pantoprazole (PROTONIX) EC tablet 40 mg  40 mg Oral Q0600 Marykay Lex, MD   40 mg at 01/27/13 [phone]  . sodium chloride 0.9 % injection 3 mL  3 mL Intravenous Q12H Brittainy Simmons, PA-C   3 mL at 01/27/13 0948  . sodium polystyrene (KAYEXALATE) 15 GM/60ML suspension 30 g  30 g Oral Once Dyke Maes,  MD      . thiamine (VITAMIN B-1) tablet 100 mg  100 mg Oral Daily Chrystie Nose, MD   100 mg at 01/27/13 0947  . triamcinolone 0.1 % cream : eucerin cream, 1:1 1 application  1 application Topical TID Robbie Lis, PA-C   1 application at 01/27/13 0948  . warfarin (COUMADIN) tablet 7.5 mg  7.5 mg Oral ONCE-1800 Marykay Lex, MD      . Warfarin - Pharmacist Dosing Inpatient   Does not apply q1800 Drue Barbaraann Faster, [facility]        PE: General appearance: alert, cooperative and no distress Lungs: clear to auscultation bilaterally Heart: regular rate and rhythm, S1, S2 normal, no murmur, click, rub or gallop Extremities: no LEE Pulses: 2+ and symmetric Skin: warm and dry Neurologic: Grossly normal  Lab Results:   Recent Labs  01/25/13 0445 01/26/13 0427 01/27/13 0720  WBC 8.3 7.4 7.0  HGB 9.7* 9.6* 9.3*  HCT 29.2*  28.8* 27.8*  PLT 257 222 221   BMET  Recent Labs  01/25/13 0445 01/26/13 0427 01/27/13 0720  NA 138 138 137  K 4.0 3.9 3.8  CL 103 102 102  CO2 27 27 25   GLUCOSE 101* 101* 96  BUN 17 15 14   CREATININE 1.36* 1.31 1.22  CALCIUM 8.6 8.5 8.6   PT/INR  Recent Labs  01/25/13 0445 01/26/13 0427 01/27/13 0720  LABPROT 14.4 14.2 14.0  INR 1.14 1.11 1.09     Assessment/Plan  Principal Problem:   Acute on chronic renal failure Active Problems:   DVT (deep venous thrombosis) March 2014   CAD, 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 Last cath 2008 - med Rx. Negative Myoview 5/12   Alcoholism   HTN (hypertension)   Hyperlipidemia   Acute on chronic combined systolic and diastolic heart failure   Psoriasis   Transaminitis   Hyperkalemia   Hypoalbuminemia   Chronic anticoagulation   Cardiomyopathy, ischemic - EF 45-50% March 2014   Anemia, transfused 1 unit PRBCs 01/22/13   Benign neoplasm of colon   Ulcer of intestine  Plan: INR is still subtherapeutic and slightly down from yesterday, at 1.09. Pharmacy is assisting with warfarin dosing. Continue w/o heparin bridge until INR goal of 2.0-3.0 is reached. HR and BP stable. Will continue to monitor.     LOS: 11 days    Brittainy M. Delmer Islam 01/27/2013 10:58 AM  I have seen and examined the patient along with Brittainy M. Sharol Harness, PA-C.  I have reviewed the chart, notes and new data.  I agree with PA's note.  Key new complaints: substantially better, no dyspnea at rest, edema improved Key examination changes: less edema Key new findings / data: improvement in renal function and LFTs. INR subtherapeutic  PLAN: Awaiting therapeutic INR. He will need very frequent follow up due to risk of alcohol-warfarin interaction and risk of noncompliance.  Thurmon Fair, MD, [facility] [phone] 01/27/2013, 4:39 PM

## 2013-01-27 NOTE — Progress Notes (Signed)
Wound care. Applied cream Pt didn't want gauze applied. Pt skin is intact.

## 2013-01-27 NOTE — Progress Notes (Signed)
[facility]  Patient Status: Active (receiving services up to time of hospitalization)  [facility] is providing the following services: RN and MSW  If patient discharges after hours, please call [phone].   Devin Williams 01/27/2013, 4:05 PM

## 2013-01-28 ENCOUNTER — Telehealth: Payer: Self-pay | Admitting: *Deleted

## 2013-01-28 ENCOUNTER — Encounter (HOSPITAL_COMMUNITY): Payer: Self-pay

## 2013-01-28 DIAGNOSIS — N289 Disorder of kidney and ureter, unspecified: Secondary | ICD-10-CM

## 2013-01-28 LAB — CBC
MCH: 30.8 pg (ref 26.0–34.0)
MCHC: 34.2 g/dL (ref 30.0–36.0)
MCV: 90.2 fL (ref 78.0–100.0)
Platelets: 217 10*3/uL (ref 150–400)
RBC: 2.95 MIL/uL — ABNORMAL LOW (ref 4.22–5.81)

## 2013-01-28 LAB — COMPREHENSIVE METABOLIC PANEL
ALT: 27 U/L (ref 0–53)
AST: 19 U/L (ref 0–37)
Albumin: 2.3 g/dL — ABNORMAL LOW (ref 3.5–5.2)
CO2: 29 mEq/L (ref 19–32)
Chloride: 101 mEq/L (ref 96–112)
Creatinine, Ser: 1.36 mg/dL — ABNORMAL HIGH (ref 0.50–1.35)
GFR calc non Af Amer: 53 mL/min — ABNORMAL LOW (ref >90)
Potassium: 3.7 mEq/L (ref 3.5–5.1)
Sodium: 139 mEq/L (ref 135–145)
Total Bilirubin: 0.6 mg/dL (ref 0.3–1.2)

## 2013-01-28 LAB — PROTIME-INR: Prothrombin Time: 15.5 seconds — ABNORMAL HIGH (ref 11.6–15.2)

## 2013-01-28 MED ORDER — WARFARIN SODIUM 7.5 MG PO TABS
7.5000 mg | ORAL_TABLET | Freq: Once | ORAL | Status: AC
  Administered 2013-01-28: 7.5 mg via ORAL
  Filled 2013-01-28: qty 1

## 2013-01-28 NOTE — Telephone Encounter (Signed)
Faxed path report from [facility] to Dr. Haywood Pao office as per Dr. Juanda Chance

## 2013-01-28 NOTE — Progress Notes (Signed)
ANTICOAGULATION CONSULT NOTE   Pharmacy Consult for warfarin Indication: bilateral DVT  Allergies  Allergen Reactions  . Penicillins     Labs:  Recent Labs  01/26/13 0427 01/27/13 0720 01/28/13 0445  HGB 9.6* 9.3* 9.1*  HCT 28.8* 27.8* 26.6*  PLT 222 221 217  LABPROT 14.2 14.0 15.5*  INR 1.11 1.09 1.25  CREATININE 1.31 1.22 1.36*    Estimated Creatinine Clearance: 60.2 ml/min (by C-G formula based on Cr of 1.36).  Assessment: 27 y/oM with recent history of bilateral DVTs on Coumadin PTA.  Home dose was 5 mg daily. INR on admit was 2.77, but Coumadin has been on hold d/t GI bleed. Now s/p EGD and colonoscopy, GI gave ok to resume Coumadin.  INR remains subtherapeutic, 1.25 (INR slowly increasing).  CBC stable.   Goal of Therapy:  INR 2-3 Monitor platelets by anticoagulation protocol: Yes   Plan:  - Repeat Coumadin 7.5 mg x1 - Daily INR - Monitor for bleeding   Thank you. Okey Regal, PharmD [phone]  01/28/2013 9:36 AM

## 2013-01-28 NOTE — Progress Notes (Signed)
[facility]   Subjective: No complaints, ambulates in hall without problems.  Skin more dry.  Objective: Vital signs in last 24 hours: Temp:  [98.1 F (36.7 C)-99.3 F (37.4 C)] 98.1 F (36.7 C) (05/28 0639) Pulse Rate:  [78-82] 82 (05/28 0639) Resp:  [18-20] 18 (05/28 0639) BP: (92-113)/(64-67) 108/64 mmHg (05/28 0639) SpO2:  [98 %-100 %] 99 % (05/28 0639) Weight:  [199 lb 3.2 oz (90.357 kg)] 199 lb 3.2 oz (90.357 kg) (05/28 0639) Weight change: -2 lb 6.4 oz (-1.089 kg) Last BM Date: 01/27/13 Intake/Output from previous day:  +340  ( total since admit -2332)  Wt 199. 3 lbs down from 217.2 on admit.   05/27 0701 - 05/28 0700 In: 1440 [P.O.:1440] Out: 1100 [Urine:1100] Intake/Output this shift:    PE: General:Pleasant affect, NAD Skin:Warm and dry, brisk capillary refill, skin flaking off more Neck:supple, no JVD, no bruits  Heart:S1S2 RRR without murmur, gallup, rub or click Lungs:clear without rales, rhonchi, or wheezes ZOX:WRUE, non tender, + BS, do not palpate liver spleen or masses Ext:no lower ext edema  Neuro:alert and oriented, MAE, follows commands, + facial symmetry   Lab Results:  Recent Labs  01/27/13 0720 01/28/13 0445  WBC 7.0 8.3  HGB 9.3* 9.1*  HCT 27.8* 26.6*  PLT 221 217   BMET  Recent Labs  01/27/13 0720 01/28/13 0445  NA 137 139  K 3.8 3.7  CL 102 101  CO2 25 29  GLUCOSE 96 90  BUN 14 15  CREATININE 1.22 1.36*  CALCIUM 8.6 8.7   No results found for this basename: TROPONINI, CK, MB,  in the last 72 hours  No results found for this basename: CHOL, HDL, LDLCALC, LDLDIRECT, TRIG, CHOLHDL   No results found for this basename: HGBA1C     Lab Results  Component Value Date   TSH 0.331* 11/29/2012    Hepatic Function Panel  Recent Labs  01/28/13 0445  PROT 6.0  ALBUMIN 2.3*  AST 19  ALT 27  ALKPHOS 161*  BILITOT 0.6    Studies/Results: No results found.  Medications: I have reviewed the  patient's current medications. Scheduled Meds: . aspirin EC  81 mg Oral Daily  . carvedilol  25 mg Oral BID WC  . dorzolamide-timolol  1 drop Both Eyes QHS  . feeding supplement  237 mL Oral BID BM  . folic acid  1 mg Oral Daily  . furosemide  60 mg Oral BID  . hydrALAZINE  25 mg Oral BID  . hydrocortisone cream  1 application Topical BID  . isosorbide mononitrate  90 mg Oral Daily  . latanoprost  1 drop Both Eyes QHS  . multivitamin with minerals  1 tablet Oral Daily  . pantoprazole  40 mg Oral Q0600  . sodium chloride  3 mL Intravenous Q12H  . sodium polystyrene  30 g Oral Once  . thiamine  100 mg Oral Daily  . triamcinolone 0.1 % cream : eucerin  1 application Topical TID  . 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 Last cath 2008 -  med Rx. Negative Myoview 5/12   Alcoholism   HTN (hypertension)   Hyperlipidemia   Psoriasis   Transaminitis   Hyperkalemia   Hypoalbuminemia   Chronic anticoagulation   Cardiomyopathy, ischemic - EF 45-50% March 2014   Benign neoplasm of colon   Ulcer of intestine  PLAN: cr. Stable though slightly elevated from yesterday.  INR not yet therapeutic, no crossover secondary to GI bleed, polypectomy  01/24/13, ulcer at cecum.  Awaiting INR therapeutic.  LOS: 12 days   Time spent with pt. :15 minutes. [facility] R  Nurse Practitioner Certified Pager [phone] 01/28/2013, 9:29 AM   I have seen and evaluated the patient this AM along with Nada Boozer, NP. I agree with her findings, examination as well as impression  recommendations.  Overall stable -- INR is drifting up with stable H/H.   Dramatic diuresis with essentially resolution of what amounted to be R sided HF related to post-phlebitic edema/ Essentially resolved Acute on Chronic Renal & Hepatic failure. Skin has improved overall, but a bit more scaling today.  Again, I admonished him re: need for close f/u (will be TCM -7 f/u), monitoring daily wgt & medicine adherence.  I agree that we would like to see near therapeutic INR on warfarin, but if close to 2 in the next few days, we could probably d/c with close INR, CBC & CMP check as OP (provided that he will show for labs).  I am very concerned about his Alcoholism being a major impediment on his medical adherence.  We will need to have a family meeting re: this concern & need for family to play a role.  MD Time with pt: 5 min  HARDING,DAVID W, [name], M.S. [facility] [address] [place], [place]  [address]  [phone] Pager # [phone] 01/28/2013 9:52 AM

## 2013-01-28 NOTE — Telephone Encounter (Signed)
He is actually Dr Rolla Etienne patient which I covered this weekend and I felt like I ought to send him a letter but his procedure should be with Dr Elnoria Howard , 2 years for EGD and 5 years for colon. Please send the Path report to Dr Elnoria Howard. ----- Message ----- From: Daphine Deutscher, RN Sent: 01/28/2013 8:25 AM To: Hart Carwin, MD Dr. Juanda Chance, Recall for EGD is in. Was not sure when his colonoscopy recall should be. Please, advise. Rene Kocher

## 2013-01-29 ENCOUNTER — Encounter (HOSPITAL_COMMUNITY): Payer: Self-pay | Admitting: Cardiology

## 2013-01-29 DIAGNOSIS — K2270 Barrett's esophagus without dysplasia: Secondary | ICD-10-CM

## 2013-01-29 HISTORY — DX: Barrett's esophagus without dysplasia: K22.70

## 2013-01-29 LAB — COMPREHENSIVE METABOLIC PANEL
Albumin: 2.5 g/dL — ABNORMAL LOW (ref 3.5–5.2)
Alkaline Phosphatase: 154 U/L — ABNORMAL HIGH (ref 39–117)
BUN: 16 mg/dL (ref 6–23)
Chloride: 100 mEq/L (ref 96–112)
Creatinine, Ser: 1.33 mg/dL (ref 0.50–1.35)
GFR calc Af Amer: 63 mL/min — ABNORMAL LOW (ref >90)
Glucose, Bld: 107 mg/dL — ABNORMAL HIGH (ref 70–99)
Potassium: 3.7 mEq/L (ref 3.5–5.1)
Total Bilirubin: 0.6 mg/dL (ref 0.3–1.2)
Total Protein: 6.2 g/dL (ref 6.0–8.3)

## 2013-01-29 LAB — CBC
Platelets: 211 10*3/uL (ref 150–400)
RBC: 3.17 MIL/uL — ABNORMAL LOW (ref 4.22–5.81)
RDW: 16.5 % — ABNORMAL HIGH (ref 11.5–15.5)
WBC: 8.4 10*3/uL (ref 4.0–10.5)

## 2013-01-29 LAB — PROTIME-INR: INR: 1.3 (ref 0.00–1.49)

## 2013-01-29 MED ORDER — HYDRALAZINE HCL 25 MG PO TABS
37.5000 mg | ORAL_TABLET | Freq: Two times a day (BID) | ORAL | Status: DC
  Administered 2013-01-29 – 2013-02-01 (×4): 37.5 mg via ORAL
  Filled 2013-01-29 (×8): qty 1.5

## 2013-01-29 MED ORDER — WARFARIN SODIUM 10 MG PO TABS
10.0000 mg | ORAL_TABLET | Freq: Once | ORAL | Status: AC
  Administered 2013-01-29: 10 mg via ORAL
  Filled 2013-01-29: qty 1

## 2013-01-29 MED ORDER — ACITRETIN 25 MG PO CAPS
25.0000 mg | ORAL_CAPSULE | Freq: Every day | ORAL | Status: DC
  Filled 2013-01-29 (×2): qty 1

## 2013-01-29 NOTE — Progress Notes (Signed)
ANTICOAGULATION CONSULT NOTE   Pharmacy Consult for warfarin Indication: bilateral DVT  Allergies  Allergen Reactions  . Penicillins     Labs:  Recent Labs  01/27/13 0720 01/28/13 0445 01/29/13 0530  HGB 9.3* 9.1* 9.6*  HCT 27.8* 26.6* 29.1*  PLT 221 217 211  LABPROT 14.0 15.5* 15.9*  INR 1.09 1.25 1.30  CREATININE 1.22 1.36* 1.33    Estimated Creatinine Clearance: 61.4 ml/min (by C-G formula based on Cr of 1.33).  Assessment: 41 y/oM with recent history of bilateral DVTs on Coumadin PTA.  Home dose was 5 mg daily. INR on admit was 2.77, but Coumadin has been on hold d/t GI bleed. Now s/p EGD and colonoscopy, GI gave ok to resume Coumadin.  INR remains subtherapeutic, 1.30 (INR slowly increasing).  CBC stable.   Goal of Therapy:  INR 2-3 Monitor platelets by anticoagulation protocol: Yes   Plan:  - Coumadin 10 mg po x 1 today - Daily INR - Monitor for bleeding   Thank you. Okey Regal, PharmD [phone]  01/29/2013 9:53 AM

## 2013-01-29 NOTE — Progress Notes (Addendum)
[facility]   Subjective: No CP, SOB, leg pain better Psoriasis more scaly  Objective: Vital signs in last 24 hours: Temp:  [97.3 F (36.3 C)-98.2 F (36.8 C)] 98.2 F (36.8 C) (05/29 0506) Pulse Rate:  [69-87] 75 (05/29 0506) Resp:  [18-20] 20 (05/29 0506) BP: (99-143)/(57-80) 143/80 mmHg (05/29 0506) SpO2:  [99 %-100 %] 100 % (05/29 0506) Weight:  [198 lb 6.6 oz (90 kg)] 198 lb 6.6 oz (90 kg) (05/29 0506) Weight change: -12.6 oz (-0.357 kg) Last BM Date: 01/28/13 Intake/Output from previous day: -400 (total since admit -2732.3)  Wt 198.6 down from 219 at peak.  05/28 0701 - 05/29 0700 In: -  Out: 400 [Urine:400] Intake/Output this shift:    PE: General:Pleasant affect, NAD Skin:Warm and dry, brisk capillary refill, flaking skin chronic- though has increased with this admit.  The floor is covered in skin flakes. He tells me it has been worse. Neck:supple, no JVD, no bruits  Heart:S1S2 RRR without murmur, gallup, rub or click Lungs:clear without rales, rhonchi, or wheezes UJW:JXBJ, non tender, + BS, do not palpate liver spleen or masses Ext:no lower ext edema, Neuro:alert and oriented, MAE, follows commands   Tele:  SR   Lab Results:  Recent Labs  01/28/13 0445 01/29/13 0530  WBC 8.3 8.4  HGB 9.1* 9.6*  HCT 26.6* 29.1*  PLT 217 211   BMET  Recent Labs  01/28/13 0445 01/29/13 0530  NA 139 138  K 3.7 3.7  CL 101 100  CO2 29 24  GLUCOSE 90 107*  BUN 15 16  CREATININE 1.36* 1.33  CALCIUM 8.7 8.7   No results found for this basename: TROPONINI, CK, MB,  in the last 72 hours  No results found for this basename: CHOL,  HDL,  LDLCALC,  LDLDIRECT,  TRIG,  CHOLHDL   No results found for this basename: HGBA1C     Lab Results  Component Value Date   TSH 0.331* 11/29/2012    Hepatic Function Panel  Recent Labs  01/29/13 0530  PROT 6.2  ALBUMIN 2.5*  AST 20  ALT 24  ALKPHOS 154*  BILITOT 0.6    PATHOLOGY from  EGD:  Studies/Results:Diagnosis 1. Esophagus, biopsy, distal - GASTROESOPHAGEAL JUNCTIONAL MUCOSA WITH GOBLET CELL METAPLASIA, CONSISTENT WITH BARRETT'S ESOPHAGUS. - THERE IS NO EVIDENCE OF DYSPLASIA OR MALIGNANCY. - SEE COMMENT. 2. Colon, polyp(s), cecal - TUBULOVILLOUS ADENOMA(S) (MULTIPLE FRAGMENTS). - HIGH GRADE DYSPLASIA IS NOT IDENTIFIED. 3. Colon, biopsy, cecal ulcer - FOCALLY ACTIVE COLITIS. - THERE IS NO EVIDENCE OF CHRONIC COLITIS, DYSPLASIA OR MALIGNANCY. - SEE COMMENT. 4. Colon, polyp(s), descending - TUBULAR ADENOMA. - HIGH GRADE DYSPLASIA IS NOT IDENTIFIED.    Medications: I have reviewed the patient's current medications. [name] aspirin EC  81 mg Oral Daily  . carvedilol  25 mg Oral BID WC  . dorzolamide-timolol  1 drop Both Eyes QHS  . feeding supplement  237 mL Oral BID BM  . folic acid  1 mg Oral Daily  . furosemide  60 mg Oral BID  . hydrALAZINE  25 mg Oral BID  . hydrocortisone cream  1 application Topical BID  . isosorbide mononitrate  90 mg Oral Daily  . latanoprost  1 drop Both Eyes QHS  . multivitamin with minerals  1 tablet Oral Daily  . pantoprazole  40 mg Oral Q0600  . sodium chloride  3 mL Intravenous Q12H  . sodium polystyrene  30 g Oral Once  . thiamine  100 mg Oral Daily  . triamcinolone 0.1 % cream : eucerin  1 application Topical TID  . 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 Last cath 2008 - med Rx. Negative Myoview 5/12   Alcoholism   HTN (hypertension)   Hyperlipidemia   Psoriasis   Transaminitis   Hyperkalemia   Hypoalbuminemia   Chronic anticoagulation   Cardiomyopathy, ischemic - EF 45-50% March 2014   Benign neoplasm of colon   Ulcer of intestine    Barrett's esophagus, by biopsy 01/24/13  PLAN: INR 1.30  Once closer to therapeutic will discharge.  LFTs improved to normal.  GI following pathology- Dr. Elnoria Howard primary GI.  Pt bored here in hospital.  LOS: 13 days   Time spent with pt. :20 minutes. [facility] R  Nurse Practitioner Certified Pager [phone] 01/29/2013, 8:51 AM  I have seen and evaluated the patient this AM along with Nada Boozer, NP. I agree with her findings, examination as well as impression recommendations.  Pretty much at baseline "dry wgt" from a volume standpoint - goal is to stay "even"  BP drifting up - increased Hydralazine to 37.5 bid INR slowly climbing -- need to see closer to 2 for d/c, not willing to bridge with recent GI bleed H/H holding steady LFTs & Creatinine have normalized -- ARF & Hepatic failure resolved.(but both are tenuous) Continue to ambulate in hall, stay active. No sign of EtOH withdrawal.  His psoriasis is less controlled now than several days ago -- says this is well controlled with Soriatane; we cannot order this via pharmacy & with recent liver failure, ?? If that makes it worse.  Pt will need f/u with Dr. Terri Piedra, his Dermatologist, [phone] who thinks that this might be the best option. ?? If Colonic ulcers may be related to soriatane. Per Dr. Sherin Quarry, will add TG & Chol panel. -- will need labs early next week.  We will need to arrange f/u with Dr. Terri Piedra next week who said to tell the front desk that this was discussed with him to "work him in" next week.  MD Time with pt: 5 min  Renelda Kilian W, [name], M.S. [facility] [address] [place], [place]  [address]  [phone] Pager # [phone] 01/29/2013 10:05 AM

## 2013-01-30 LAB — COMPREHENSIVE METABOLIC PANEL
Calcium: 9 mg/dL (ref 8.4–10.5)
GFR calc non Af Amer: 50 mL/min — ABNORMAL LOW (ref >90)
Potassium: 3.9 mEq/L (ref 3.5–5.1)
Sodium: 138 mEq/L (ref 135–145)
Total Bilirubin: 0.5 mg/dL (ref 0.3–1.2)
Total Protein: 6.4 g/dL (ref 6.0–8.3)

## 2013-01-30 LAB — CBC
Platelets: 208 10*3/uL (ref 150–400)
RBC: 3.24 MIL/uL — ABNORMAL LOW (ref 4.22–5.81)
RDW: 16.5 % — ABNORMAL HIGH (ref 11.5–15.5)
WBC: 8.6 10*3/uL (ref 4.0–10.5)

## 2013-01-30 LAB — LIPID PANEL: Cholesterol: 163 mg/dL (ref 0–200)

## 2013-01-30 LAB — PROTIME-INR: Prothrombin Time: 17.3 seconds — ABNORMAL HIGH (ref 11.6–15.2)

## 2013-01-30 MED ORDER — WARFARIN SODIUM 10 MG PO TABS
10.0000 mg | ORAL_TABLET | Freq: Once | ORAL | Status: DC
  Filled 2013-01-30: qty 1

## 2013-01-30 MED ORDER — WARFARIN SODIUM 10 MG PO TABS
10.0000 mg | ORAL_TABLET | Freq: Once | ORAL | Status: AC
  Administered 2013-01-30: 10 mg via ORAL
  Filled 2013-01-30: qty 1

## 2013-01-30 NOTE — Progress Notes (Signed)
[facility]  Subjective: No complaints  Objective: Vital signs in last 24 hours: Temp:  [97.7 F (36.5 C)-98.7 F (37.1 C)] 98.4 F (36.9 C) (05/30 0618) Pulse Rate:  [81-85] 84 (05/30 0618) Resp:  [18-20] 18 (05/30 0618) BP: (93-115)/(50-66) 106/50 mmHg (05/30 0618) SpO2:  [100 %] 100 % (05/30 0618) Weight:  [197 lb 1.6 oz (89.404 kg)] 197 lb 1.6 oz (89.404 kg) (05/30 0618) Last BM Date: 01/28/13  Intake/Output from previous day: 05/29 0701 - 05/30 0700 In: 720 [P.O.:720] Out: 1000 [Urine:1000] Intake/Output this shift:    Medications Current Facility-Administered Medications  Medication Dose Route Frequency Provider Last Rate Last Dose  . acitretin (SORIATANE) capsule 25 mg  25 mg Oral Daily Marykay Lex, MD      . alum & mag hydroxide-simeth (MAALOX/MYLANTA) 200-200-20 MG/5ML suspension 30 mL  30 mL Oral Q6H PRN Ardis Rowan, MD   30 mL at 01/23/13 0307  . aspirin EC tablet 81 mg  81 mg Oral Daily Brittainy Simmons, PA-C   81 mg at 01/29/13 1041  . carvedilol (COREG) tablet 25 mg  25 mg Oral BID WC Nada Boozer, NP   25 mg at 01/29/13 1731  . dorzolamide-timolol (COSOPT) 22.3-6.8 MG/ML ophthalmic solution 1 drop  1 drop Both Eyes QHS Brittainy Simmons, PA-C   1 drop at 01/29/13 2116  . feeding supplement (ENSURE COMPLETE) liquid 237 mL  237 mL Oral BID BM Chrystie Nose, MD   237 mL at 01/29/13 1400  . folic acid (FOLVITE) tablet 1 mg  1 mg Oral Daily Chrystie Nose, MD   1 mg at 01/29/13 1041  . furosemide (LASIX) tablet 60 mg  60 mg Oral BID Marykay Lex, MD   60 mg at 01/29/13 1505  . hydrALAZINE (APRESOLINE) tablet 37.5 mg  37.5 mg Oral BID Marykay Lex, MD   37.5 mg at 01/29/13 1042  . hydrocortisone cream 1 % 1 application  1 application Topical BID Robbie Lis, PA-C   1 application at 01/29/13 2117  . hydrOXYzine (ATARAX/VISTARIL) tablet 10 mg  10 mg Oral TID PRN Robbie Lis, PA-C   10 mg at 01/30/13 0750  .  isosorbide mononitrate (IMDUR) 24 hr tablet 90 mg  90 mg Oral Daily Nada Boozer, NP   90 mg at 01/29/13 1041  . latanoprost (XALATAN) 0.005 % ophthalmic solution 1 drop  1 drop Both Eyes QHS Brittainy Simmons, PA-C   1 drop at 01/29/13 2115  . multivitamin with minerals tablet 1 tablet  1 tablet Oral Daily Brittainy Simmons, PA-C   1 tablet at 01/29/13 1041  . nitroGLYCERIN (NITROSTAT) SL tablet 0.4 mg  0.4 mg Sublingual Q5 min PRN Brittainy Simmons, PA-C   0.4 mg at 01/19/13 1537  . ondansetron (ZOFRAN) tablet 4 mg  4 mg Oral Q6H PRN Brittainy Simmons, PA-C       Or  . ondansetron (ZOFRAN) injection 4 mg  4 mg Intravenous Q6H PRN Brittainy Simmons, PA-C      . oxyCODONE (Oxy IR/ROXICODONE) immediate release tablet 5 mg  5 mg Oral Q4H PRN Brittainy Simmons, PA-C   5 mg at 01/25/13 2100  . pantoprazole (PROTONIX) EC tablet 40 mg  40 mg Oral Q0600 Marykay Lex, MD   40 mg at 01/30/13 0750  . sodium chloride 0.9 % injection 3 mL  3 mL Intravenous Q12H Brittainy Simmons, PA-C   3 mL at 01/29/13 2117  . sodium polystyrene (KAYEXALATE) 15  GM/60ML suspension 30 g  30 g Oral Once Dyke Maes, MD      . thiamine (VITAMIN B-1) tablet 100 mg  100 mg Oral Daily Chrystie Nose, MD   100 mg at 01/29/13 1042  . triamcinolone 0.1 % cream : eucerin cream, 1:1 1 application  1 application Topical TID Robbie Lis, PA-C   1 application at 01/29/13 2117  . Warfarin - Pharmacist Dosing Inpatient   Does not apply q1800 Drue Barbaraann Faster, [facility]        PE: General appearance: alert, cooperative and no distress Lungs: clear to auscultation bilaterally Heart: regular rate and rhythm, S1, S2 normal, no murmur, click, rub or gallop Pulses: 2+ and symmetric Neurologic: Grossly normal No LEE  Lab Results:   Recent Labs  01/28/13 0445 01/29/13 0530 01/30/13 0610  WBC 8.3 8.4 8.6  HGB 9.1* 9.6* 9.9*  HCT 26.6* 29.1* 29.6*  PLT 217 211 208   BMET  Recent Labs  01/28/13 0445 01/29/13 0530  01/30/13 0610  NA 139 138 138  K 3.7 3.7 3.9  CL 101 100 101  CO2 29 24 29   GLUCOSE 90 107* 111*  BUN 15 16 15   CREATININE 1.36* 1.33 1.42*  CALCIUM 8.7 8.7 9.0   PT/INR  Recent Labs  01/28/13 0445 01/29/13 0530 01/30/13 0610  LABPROT 15.5* 15.9* 17.3*  INR 1.25 1.30 1.46   Cholesterol  Recent Labs  01/30/13 0610  CHOL 163   Lipid Panel     Component Value Date/Time   CHOL 163 01/30/2013 0610   TRIG 113 01/30/2013 0610   HDL 33* 01/30/2013 0610   CHOLHDL 4.9 01/30/2013 0610   VLDL 23 01/30/2013 0610   LDLCALC 107* 01/30/2013 0610     Assessment/Plan   Principal Problem:   Acute on chronic renal failure Active Problems:   DVT (deep venous thrombosis) March 2014   CAD, 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 Last cath 2008 - med Rx. Negative Myoview 5/12   Alcoholism   HTN (hypertension)   Hyperlipidemia   Acute on chronic combined systolic and diastolic heart failure   Psoriasis   Transaminitis   Hyperkalemia   Hypoalbuminemia   Chronic anticoagulation   Cardiomyopathy, ischemic - EF 45-50% March 2014   Anemia, transfused 1 unit PRBCs 01/22/13   Benign neoplasm of colon   Ulcer of intestine   Barrett's esophagus, by biopsy 01/24/13  Plan:  INR now 1.42.  HGB stable.  Net fluids -213ml/ -3.0L since adm.  Lasix at 60mg  BID.  Potassium stable on no supplementation.  DC when INR therapeutic.  LOS: 14 days    HAGER, BRYAN 01/30/2013 8:37 AM  I have seen and examined the patient along with Wilburt Finlay , PA.  I have reviewed the chart, notes and new data.  I agree with PA's note.  Key new complaints: lying fully supine in bed without dyspnea, walked to bathroom without difficulty Key examination changes: no edema, no rales no S3 Key new findings / data: INR still subtherapeutic, serum creat slightly higher, normal LFTs except low albumin  PLAN: DC home when INR >2.  Thurmon Fair, MD, [facility] [phone] 01/30/2013, 11:23 AM

## 2013-01-30 NOTE — Progress Notes (Signed)
ANTICOAGULATION CONSULT NOTE   Pharmacy Consult for warfarin Indication: bilateral DVT  Allergies  Allergen Reactions  . Penicillins     Labs:  Recent Labs  01/28/13 0445 01/29/13 0530 01/30/13 0610  HGB 9.1* 9.6* 9.9*  HCT 26.6* 29.1* 29.6*  PLT 217 211 208  LABPROT 15.5* 15.9* 17.3*  INR 1.25 1.30 1.46  CREATININE 1.36* 1.33 1.42*    Estimated Creatinine Clearance: 57.4 ml/min (by C-G formula based on Cr of 1.42).  Assessment: 84 y/oM with recent history of bilateral DVTs on Coumadin PTA.  Home dose was 5 mg daily. INR on admit was 2.77, but Coumadin has been on hold d/t GI bleed. Now s/p EGD and colonoscopy, GI gave ok to resume Coumadin.  INR remains subtherapeutic, 1.46 (INR slowly increasing).  CBC stable.   Goal of Therapy:  INR 2-3 Monitor platelets by anticoagulation protocol: Yes   Plan:  - Coumadin 10 mg po x 1 today - Daily INR - Monitor for bleeding

## 2013-01-31 LAB — COMPREHENSIVE METABOLIC PANEL
Alkaline Phosphatase: 136 U/L — ABNORMAL HIGH (ref 39–117)
BUN: 15 mg/dL (ref 6–23)
CO2: 28 mEq/L (ref 19–32)
Chloride: 102 mEq/L (ref 96–112)
GFR calc Af Amer: 64 mL/min — ABNORMAL LOW (ref >90)
Glucose, Bld: 98 mg/dL (ref 70–99)
Potassium: 3.8 mEq/L (ref 3.5–5.1)
Total Bilirubin: 0.4 mg/dL (ref 0.3–1.2)

## 2013-01-31 LAB — CBC
Platelets: 201 10*3/uL (ref 150–400)
RDW: 16.3 % — ABNORMAL HIGH (ref 11.5–15.5)
WBC: 8.6 10*3/uL (ref 4.0–10.5)

## 2013-01-31 LAB — PROTIME-INR: INR: 1.69 — ABNORMAL HIGH (ref 0.00–1.49)

## 2013-01-31 MED ORDER — WARFARIN SODIUM 10 MG PO TABS
10.0000 mg | ORAL_TABLET | Freq: Once | ORAL | Status: AC
  Administered 2013-01-31: 10 mg via ORAL
  Filled 2013-01-31: qty 1

## 2013-01-31 NOTE — Progress Notes (Signed)
ANTICOAGULATION CONSULT NOTE   Pharmacy Consult for warfarin Indication: bilateral DVT  Allergies  Allergen Reactions  . Penicillins     Labs:  Recent Labs  01/29/13 0530 01/30/13 0610 01/31/13 0518  HGB 9.6* 9.9* 9.5*  HCT 29.1* 29.6* 28.1*  PLT 211 208 201  LABPROT 15.9* 17.3* 19.3*  INR 1.30 1.46 1.69*  CREATININE 1.33 1.42* 1.31    Estimated Creatinine Clearance: 62.1 ml/min (by C-G formula based on Cr of 1.31).  Assessment: 50 y/oM with recent history of bilateral DVTs on Coumadin PTA.  Home dose was 5 mg daily. INR on admit was 2.77, but Coumadin has been on hold d/t GI bleed. Now s/p EGD and colonoscopy, GI gave ok to resume Coumadin.  INR remains subtherapeutic, 1.69 (INR slowly increasing).  CBC stable.   Goal of Therapy:  INR 2-3 Monitor platelets by anticoagulation protocol: Yes   Plan:  - Repeat Coumadin 10 mg po x 1 today - Daily INR - Monitor for bleeding   Thank you. Okey Regal, PharmD [phone]

## 2013-01-31 NOTE — Progress Notes (Signed)
Subjective:  Feels fine, no complaints besides psoriasis acting up again.  No SOB or CP. No melanic stools or hematochezia.  Objective:  Vital Signs in the last 24 hours: Temp:  [97.4 F (36.3 C)-98.6 F (37 C)] 97.4 F (36.3 C) (05/30 2114) Pulse Rate:  [76-85] 85 (05/31 0651) Resp:  [17-18] 18 (05/31 0651) BP: (104-133)/(58-71) 133/71 mmHg (05/31 0651) SpO2:  [99 %-100 %] 100 % (05/31 0651) Weight:  [196 lb 12.8 oz (89.268 kg)] 196 lb 12.8 oz (89.268 kg) (05/31 0651)  Intake/Output from previous day: 05/30 0701 - 05/31 0700 In: 1425 [P.O.:1422; I.V.:3] Out: 1200 [Urine:1200] Intake/Output from this shift: Total I/O In: 240 [P.O.:240] Out: 400 [Urine:400]  Physical Exam: General appearance: alert, cooperative, no distress, moderately obese and normal mood & afftect -- more talkative today. Lungs: clear to auscultation bilaterally, normal percussion bilaterally and non-labored Heart: regular rate and rhythm, S1, S2 normal, no murmur, click, rub or gallop Abdomen: soft, non-tender; bowel sounds normal; no masses,  no organomegaly and mildly obese, mild hepatomegally (non-pulsatile) Extremities: edema minimal edema  and no bruising. Pulses: 2+ and symmetric Skin: Skin color, texture, turgor normal. No rashes or lesions or soriasis is begininf to flake & sluff again, more notable on legs & arms. but also on face.  Lab Results:  Recent Labs  01/30/13 0610 01/31/13 0518  WBC 8.6 8.6  HGB 9.9* 9.5*  PLT 208 201    Recent Labs  01/30/13 0610 01/31/13 0518  NA 138 138  K 3.9 3.8  CL 101 102  CO2 29 28  GLUCOSE 111* 98  BUN 15 15  CREATININE 1.42* 1.31   No results found for this basename: TROPONINI, CK, MB,  in the last 72 hours Hepatic Function Panel  Recent Labs  01/31/13 0518  PROT 6.3  ALBUMIN 2.5*  AST 15  ALT 16  ALKPHOS 136*  BILITOT 0.4    Recent Labs  01/30/13 0610  CHOL 163    Imaging: None recent.  Cardiac Studies: No recent  studies  Assessment/Plan:  Principal Problem:   Acute on chronic renal failure - stable to resolved. 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 Last cath 2008 - med Rx. Negative Myoview 5/12    Cardiomyopathy, ischemic - EF 45-50% March 2014    Anemia, transfused 1 unit PRBCs 01/22/13 - stable H/H post transfusion; likely source, Colonic ulcer (? If related to Soriatane)    HTN (hypertension)    Hypoalbuminemia    DVT (deep venous thrombosis) March 2014  Chronic anticoagulation -INR remains subtherapeutic    Hyperlipidemia      Alcoholism -- reluctant to go to AA as OP, but this may be a major barrier for stable OP care    Acute on chronic combined systolic and diastolic heart failure - adequately diuresed.    Psoriasis - will need close OP f/u (see my previous note re: discussion with Dr. Terri Piedra); have checked lipid panel per Derm request.    Transaminitis - normalized.    Hyperkalemia -- stable    Benign neoplasm of colon    Ulcer of intestine **    Barrett's esophagus, by biopsy 01/24/13   Relatively stable from an overall Cardiac, renal; & GI standpoint, H/H stable as well.  Simply awaiting therapeutic INR -- hopefully will be close enough to 2 by tomorrow to plan d/c.  He will need OP CBC/CMP & INR check in ~2 days post-d/c. & will be TCM 7 d/c  LOS: 15 days    HARDING,DAVID W 01/31/2013, 9:15 AM

## 2013-02-01 DIAGNOSIS — E785 Hyperlipidemia, unspecified: Secondary | ICD-10-CM

## 2013-02-01 DIAGNOSIS — K2270 Barrett's esophagus without dysplasia: Secondary | ICD-10-CM

## 2013-02-01 DIAGNOSIS — R609 Edema, unspecified: Secondary | ICD-10-CM

## 2013-02-01 LAB — COMPREHENSIVE METABOLIC PANEL
ALT: 16 U/L (ref 0–53)
AST: 16 U/L (ref 0–37)
Calcium: 8.9 mg/dL (ref 8.4–10.5)
Creatinine, Ser: 1.45 mg/dL — ABNORMAL HIGH (ref 0.50–1.35)
GFR calc Af Amer: 57 mL/min — ABNORMAL LOW (ref >90)
Glucose, Bld: 109 mg/dL — ABNORMAL HIGH (ref 70–99)
Sodium: 137 mEq/L (ref 135–145)
Total Protein: 6.3 g/dL (ref 6.0–8.3)

## 2013-02-01 LAB — CBC
HCT: 27.4 % — ABNORMAL LOW (ref 39.0–52.0)
Hemoglobin: 9.3 g/dL — ABNORMAL LOW (ref 13.0–17.0)
MCHC: 33.9 g/dL (ref 30.0–36.0)
RBC: 3.05 MIL/uL — ABNORMAL LOW (ref 4.22–5.81)

## 2013-02-01 LAB — PROTIME-INR: INR: 1.94 — ABNORMAL HIGH (ref 0.00–1.49)

## 2013-02-01 MED ORDER — TRIAMCINOLONE 0.1 % CREAM:EUCERIN CREAM 1:1: 1.0000 "application " | TOPICAL_CREAM | Freq: Three times a day (TID) | CUTANEOUS | Status: AC

## 2013-02-01 MED ORDER — WARFARIN SODIUM 10 MG PO TABS
10.0000 mg | ORAL_TABLET | Freq: Once | ORAL | Status: AC
  Administered 2013-02-01: 10 mg via ORAL
  Filled 2013-02-01: qty 1

## 2013-02-01 MED ORDER — FOLIC ACID 1 MG PO TABS: 1.0000 mg | ORAL_TABLET | Freq: Every day | ORAL | Status: AC

## 2013-02-01 MED ORDER — FUROSEMIDE 20 MG PO TABS: 60.0000 mg | ORAL_TABLET | Freq: Two times a day (BID) | ORAL | Status: DC

## 2013-02-01 MED ORDER — HYDRALAZINE HCL 25 MG PO TABS: 37.5000 mg | ORAL_TABLET | Freq: Two times a day (BID) | ORAL | Status: DC

## 2013-02-01 MED ORDER — ISOSORBIDE MONONITRATE ER 30 MG PO TB24: 30.0000 mg | ORAL_TABLET | Freq: Every day | ORAL | Status: DC

## 2013-02-01 MED ORDER — THIAMINE HCL 100 MG PO TABS: 100.0000 mg | ORAL_TABLET | Freq: Every day | ORAL | Status: DC

## 2013-02-01 NOTE — Progress Notes (Signed)
Clinical Social Work Department BRIEF PSYCHOSOCIAL ASSESSMENT 02/01/2013  Patient:  Devin Williams, Devin Williams     Account Number:  [id number]     Admit date:  01/16/2013  Clinical Social Worker:  Leron Croak, CLINICAL SOCIAL WORKER  Date/Time:  02/01/2013 12:00 M  Referred by:  Physician  Date Referred:  02/01/2013 Referred for  Substance Abuse   Other Referral:   Interview type:  Patient Other interview type:    PSYCHOSOCIAL DATA Living Status:  WIFE Admitted from facility:   Level of care:   Primary support name:  Zimir Kittleson   [phone] Primary support relationship to patient:  SPOUSE Degree of support available:   Pt has good support from family    CURRENT CONCERNS Current Concerns  Substance Abuse   Other Concerns:    SOCIAL WORK ASSESSMENT / PLAN CSW met with the Pt for current substance abuse consult. Pt was getting ready to leave the hospital but was agreeable to assessment/resources. Pt stated that he drinks alcohol and his last drink was 2-3 weeks ago. Pt stated that he "drinks about a 1/2 pint of Vodka every other day or so." Pt stated that he has been drinking for over 15 years. Pt does not feel like he needs Tx at this time, however was agreeable to accepting information for Tx if Pt were to change his mind.   Assessment/plan status:  Information/Referral to [facility] Other assessment/ plan:   Information/referral to community resources:   Pt was given Detox/Rehab  Resources    PATIENT'S/FAMILY'S RESPONSE TO PLAN OF CARE: Pt was appreciative for assistance and excited about going home.     Leron Croak, LCSWA [facility].  [phone]

## 2013-02-01 NOTE — Progress Notes (Signed)
ANTICOAGULATION CONSULT NOTE   Pharmacy Consult for Warfarin Indication: bilateral DVT  Allergies  Allergen Reactions  . Penicillins     Labs:  Recent Labs  01/30/13 0610 01/31/13 0518 02/01/13 0417  HGB 9.9* 9.5* 9.3*  HCT 29.6* 28.1* 27.4*  PLT 208 201 200  LABPROT 17.3* 19.3* 21.4*  INR 1.46 1.69* 1.94*  CREATININE 1.42* 1.31 1.45*    Estimated Creatinine Clearance: 56.2 ml/min (by C-G formula based on Cr of 1.45).  Assessment: 22 y/oM with recent history of bilateral DVTs on Coumadin PTA.  Home dose was 5 mg daily. INR on admit was 2.77, but Coumadin has been on hold d/t GI bleed. Now s/p EGD and colonoscopy, GI gave ok to resume Coumadin.  INR remains slightly subtherapeutic, 1.94.  CBC stable.   Requiring much high doses this admission than at home.  Goal of Therapy:  INR 2-3 Monitor platelets by anticoagulation protocol: Yes   Plan:  - Repeat Coumadin 10 mg po x 1 today then recommend 7.5 mg daily if discharged home - Daily INR - Monitor for bleeding   Thank you. Okey Regal, PharmD [phone]

## 2013-02-01 NOTE — Progress Notes (Addendum)
[facility]  Subjective: No complaints  Objective: Vital signs in last 24 hours: Temp:  [98 F (36.7 C)-98.2 F (36.8 C)] 98.2 F (36.8 C) (06/01 0425) Pulse Rate:  [74-84] 78 (06/01 0425) Resp:  [20] 20 (06/01 0425) BP: (108-123)/(62-72) 123/72 mmHg (06/01 0425) SpO2:  [99 %-100 %] 99 % (06/01 0425) Weight:  [197 lb 1.5 oz (89.4 kg)] 197 lb 1.5 oz (89.4 kg) (06/01 0425) Last BM Date: 01/31/13  Intake/Output from previous day: 05/31 0701 - 06/01 0700 In: 480 [P.O.:480] Out: 900 [Urine:900] Intake/Output this shift:    Medications Current Facility-Administered Medications  Medication Dose Route Frequency Provider Last Rate Last Dose  . alum & mag hydroxide-simeth (MAALOX/MYLANTA) 200-200-20 MG/5ML suspension 30 mL  30 mL Oral Q6H PRN Ardis Rowan, MD   30 mL at 01/23/13 0307  . aspirin EC tablet 81 mg  81 mg Oral Daily Brittainy Simmons, PA-C   81 mg at 01/31/13 1010  . carvedilol (COREG) tablet 25 mg  25 mg Oral BID WC Nada Boozer, NP   25 mg at 02/01/13 7829  . dorzolamide-timolol (COSOPT) 22.3-6.8 MG/ML ophthalmic solution 1 drop  1 drop Both Eyes QHS Brittainy Simmons, PA-C   1 drop at 01/31/13 2105  . feeding supplement (ENSURE COMPLETE) liquid 237 mL  237 mL Oral BID BM Chrystie Nose, MD   237 mL at 01/31/13 1400  . folic acid (FOLVITE) tablet 1 mg  1 mg Oral Daily Chrystie Nose, MD   1 mg at 01/31/13 1009  . furosemide (LASIX) tablet 60 mg  60 mg Oral BID Marykay Lex, MD   60 mg at 02/01/13 0804  . hydrALAZINE (APRESOLINE) tablet 37.5 mg  37.5 mg Oral BID Marykay Lex, MD   37.5 mg at 01/31/13 1009  . hydrocortisone cream 1 % 1 application  1 application Topical BID Robbie Lis, PA-C   1 application at 01/31/13 2106  . hydrOXYzine (ATARAX/VISTARIL) tablet 10 mg  10 mg Oral TID PRN Robbie Lis, PA-C   10 mg at 02/01/13 0643  . isosorbide mononitrate (IMDUR) 24 hr tablet 90 mg  90 mg Oral Daily Nada Boozer, NP    90 mg at 01/31/13 1010  . latanoprost (XALATAN) 0.005 % ophthalmic solution 1 drop  1 drop Both Eyes QHS Brittainy Simmons, PA-C   1 drop at 01/31/13 2105  . multivitamin with minerals tablet 1 tablet  1 tablet Oral Daily Brittainy Simmons, PA-C   1 tablet at 01/31/13 1010  . nitroGLYCERIN (NITROSTAT) SL tablet 0.4 mg  0.4 mg Sublingual Q5 min PRN Brittainy Simmons, PA-C   0.4 mg at 01/19/13 1537  . ondansetron (ZOFRAN) tablet 4 mg  4 mg Oral Q6H PRN Brittainy Simmons, PA-C       Or  . ondansetron (ZOFRAN) injection 4 mg  4 mg Intravenous Q6H PRN Brittainy Simmons, PA-C      . oxyCODONE (Oxy IR/ROXICODONE) immediate release tablet 5 mg  5 mg Oral Q4H PRN Brittainy Simmons, PA-C   5 mg at 01/25/13 2100  . pantoprazole (PROTONIX) EC tablet 40 mg  40 mg Oral Q0600 Marykay Lex, MD   40 mg at 02/01/13 [phone]  . sodium chloride 0.9 % injection 3 mL  3 mL Intravenous Q12H Brittainy Simmons, PA-C   3 mL at 01/31/13 2200  . sodium polystyrene (KAYEXALATE) 15 GM/60ML suspension 30 g  30 g Oral Once Dyke Maes, MD      .  thiamine (VITAMIN B-1) tablet 100 mg  100 mg Oral Daily Chrystie Nose, MD   100 mg at 01/31/13 1009  . triamcinolone 0.1 % cream : eucerin cream, 1:1 1 application  1 application Topical TID Robbie Lis, PA-C   1 application at 01/31/13 2106  . warfarin (COUMADIN) tablet 10 mg  10 mg Oral Once Marykay Lex, MD      . Warfarin - Pharmacist Dosing Inpatient   Does not apply q1800 Doris Cheadle, [facility]        PE: General appearance: alert, cooperative, appears stated age, no distress and mildly obese Neck: no adenopathy, no carotid bruit and no JVD Lungs: clear to auscultation bilaterally, normal percussion bilaterally and non-labored Heart: regular rate and rhythm, S1, S2 normal, no murmur, click, rub or gallop, non-displaced PMI Abdomen: soft, non-tender; bowel sounds normal; no masses,  no organomegaly; mildly obese Extremities: extremities normal, atraumatic, no  cyanosis, and edema trivial. Pulses: 2+ and symmetric;  Skin: still with some sloughing, but much improved after genrerous application of TAC cream, Neurologic: Mental status: Alert, oriented, thought content appropriate Cranial nerves: normal (II-XII grossly intact)  Lab Results:   Recent Labs  01/30/13 0610 01/31/13 0518 02/01/13 0417  WBC 8.6 8.6 9.1  HGB 9.9* 9.5* 9.3*  HCT 29.6* 28.1* 27.4*  PLT 208 201 200   BMET  Recent Labs  01/30/13 0610 01/31/13 0518 02/01/13 0417  NA 138 138 137  K 3.9 3.8 3.4*  CL 101 102 101  CO2 29 28 27   GLUCOSE 111* 98 109*  BUN 15 15 16   CREATININE 1.42* 1.31 1.45*  CALCIUM 9.0 9.0 8.9   PT/INR  Recent Labs  01/30/13 0610 01/31/13 0518 02/01/13 0417  LABPROT 17.3* 19.3* 21.4*  INR 1.46 1.69* 1.94*   Cholesterol  Recent Labs  01/30/13 0610  CHOL 163   Lipid Panel     Component Value Date/Time   CHOL 163 01/30/2013 0610   TRIG 113 01/30/2013 0610   HDL 33* 01/30/2013 0610   CHOLHDL 4.9 01/30/2013 0610   VLDL 23 01/30/2013 0610   LDLCALC 107* 01/30/2013 0610   HAGER, BRYAN 02/01/2013 9:33 AM  Pt seen & examined, agree with findings & exam above.  Assessment/Plan Principal Problem:   Acute on chronic renal failure - resolved. Baseline Cr ~1.4 Active Problems:  DVT (deep venous thrombosis) March 2014 - back on warfarin     Chronic anticoagulation -- INR 1.94, H/H stable.  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 Last cath 2008 - med Rx. Negative Myoview 5/12 -- .  Cardiomyopathy, ischemic - EF 45-50% March 2014  Acute on chronic combined systolic and diastolic heart failure -- on stable dose of lasix.  Much improved.  Alcoholism  Hypoalbuminemia -- likely related to alcoholism.  Needs OP nutrition f/u  Anemia, transfused 1 unit PRBCs 01/22/13  Benign neoplasm of colon  Ulcer of intestine (Colonic)  Barrett's esophagus, by biopsy 01/24/13  HTN (hypertension) - stable  Hyperlipidemia - stable, restart statin & fibrate on  d/c  Psoriasis -- stable.  TAC cream TID, d/c soriatane.  OP f/u with Dr. Terri Piedra.  Transaminitis - resolved  Hyperkalemia - resolved  Plan:  INR now 1.94.  HGB stable.  Net fluids -464ml/ -3.2L since adm.  Lasix at 60mg  BID.  Potassium stable on no supplementation (use with increased dose of lasix.  DC home today -- TCM 7.   Followup CBC, CMP, INR -- On Tuesday.  BP and HR stable.  Sliding  Scale Lasix instructions for d/c: Sliding scale Lasix: Weigh yourself when you get home, then Daily in the Morning. Your dry weight will be what your scale says on the day you return home.(here is 196 lbs.).   If you gain more than 3 pounds from dry weight: Increase the Lasix dosing to 80 mg (4 tab)  in the morning and 60 (3 tab) mg in the afternoon until weight returns to baseline dry weight.  If weight gain is greater than 5 pounds in 2 days: Increased to Lasix 80 mg (4 tab) twice a day and contact the office for further assistance if weight does not go down the next day.  If the weight goes down more than 3 pounds from dry weight: Hold Lasix until it returns to baseline dry weight   MD time with patient 15 min including d/c counseling.  5 min with chart -- total MD time 20 min.  With PA - 35 min.   LOS: 16 days   HARDING,DAVID W, [name], M.S. [facility] [address] [place], [place]  [address]  [phone] Pager # [phone] 02/01/2013 10:19 AM

## 2013-02-01 NOTE — Progress Notes (Signed)
   CARE MANAGEMENT NOTE 02/01/2013  Patient:  Devin Williams, Devin Williams   Account Number:  [id number]  Date Initiated:  01/20/2013  Documentation initiated by:  Jiles Crocker  Subjective/Objective Assessment:   ADMITTED WITH Increased weight gain + increased LEE and LE pain.     Action/Plan:   PCP IS DR Earvin Hansen [facility]; LIVES AT HOME WITH SPOUSE; POSSIBLY NEED HHC AT DISCHARGE; CM FOLLOWING FOR DCP   Anticipated DC Date:  01/23/2013   Anticipated DC Plan:  HOME W HOME HEALTH SERVICES      DC Planning Services  CM consult      Choice offered to / List presented to:             Status of service:  Completed, signed off Medicare Important Message given?  NA - LOS <3 / Initial given by admissions (If response is "NO", the following Medicare IM given date fields will be blank) Date Medicare IM given:   Date Additional Medicare IM given:    Discharge Disposition:  HOME/SELF CARE  Per UR Regulation:  Reviewed for med. necessity/level of care/duration of stay  If discussed at Long Length of Stay Meetings, dates discussed:   01/20/2013  01/22/2013  01/29/2013    Comments:  02/01/2013 1400 Pt states he would like application for patient assistance application for meds. He wanted info on soriatane and triamcinolone. Pt on his way home but NCM explained will mail application to his home. [name] RN CCM Case Mgmt phone [phone]  01/21/13 1400 In to speak with pt. about medication needs. Pt. states he sometimes has difficulty obtaining one of his skin prescriptions.  Pt. states he uses [facility] on [address] TC to [facility] [phone]) to inquire of medications.  Was told pt. would need refill RX on Soriatane and Vistaril.  Physician, please write refill RX for these two medications. Pt. states he is able to afford his meds.  Pt. may be ready for dc tomorrow. [name], RN, BSN NCM [phone]    5/20/2014[name] RN,BSN,MHA

## 2013-02-01 NOTE — Progress Notes (Signed)
Client verbalized understanding of discharge instructions.  Skin care completed today.  Picked up for transportation by wife.

## 2013-02-03 NOTE — Discharge Summary (Signed)
He was seen on the final day of hospitalization - most issues have been resolved.   His INR was therapeutic & is ready for discharge.  I agree with the d/c summary.  Please see my last Progress Note for additional thoughts.  Marykay Lex, [name], M.S. [facility] [address] [place], [place]  [address]  [phone] Pager # [phone] 02/03/2013 1:13 PM

## 2013-02-03 NOTE — Discharge Summary (Signed)
Physician Discharge Summary  Patient ID: Devin Williams MRN: [id number] DOB/AGE: Jan 14, 1946 67 y.o.  Admit date: 01/16/2013 Discharge date: 02/01/13  Admission Diagnoses:  Acute on Chronic systolic CHF.  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 Last cath 2008 - med Rx. Negative Myoview 5/12   Alcoholism   HTN (hypertension)   Hyperlipidemia   Acute on chronic combined systolic and diastolic heart failure   Psoriasis   Transaminitis   Hyperkalemia   Hypoalbuminemia   Chronic anticoagulation   Cardiomyopathy, ischemic - EF 45-50% March 2014   Anemia, transfused 1 unit PRBCs 01/22/13   Benign neoplasm of colon   Ulcer of intestine   Barrett's esophagus, by biopsy 01/24/13   Hypochloridemia    Hyponatremia   Hypotension  Discharged Condition: stable  Hospital Course:  The patient is a 67 y/o AAM, followed at [facility] by Dr. Herbie Baltimore, with past cardiac history as follows:   1. Coronary artery disease, CABG in January 1999: LIMA-LAD, SVG-OM1-OM2 sequential, SVG-ramus  intermedius, SVG-RCA.   a. Most recent catheterization, June 2008: RCA proximally occluded, filled via collaterals from the  left; distal left main 80% to 90% with the LAD mid occlusion at D1 bifurcation. Circumflex proximally  occluded but the AV branch provided collaterals to the RCA. Distal circumflex perfused via  SVG-OM1-OM2, gave retrograde flow to the OM3. Ramus intermedius occluded proximally, filled by  collaterals from the circumflex.  SVG-RCA and SVG-diagonal and SVG-ramus were all occluded.  LIMA-LAD was patent. Therefore, only the sequential SVGs to OM1 and OM2 and the LIMA-LAD  were patent.   b. Recent echo EF of 45% to 50%, which was down from before. There was moderate aortic   sclerosis but no stenosis and the left atrium was noted to be dilated on that previous study. The  right ventricle was  dilated on the one in March.   c. Persantine Myoview, May 2012: Inferior attenuation, low risk and no ischemia, either due to  infarction or  scar but no ischemia noted.  2. Chronic stable angina had previously been well controlled, now not as well controlled.   He was recently admitted to the hospital on May 4. 2014 with shortness of breath and edema, and was found to have bilateral lower extremity DVTs, with a V/Q scan which did not show any evidence of PE and was thought to be low probability. He had right popliteal and posterior tibial thromboses that were thought to be subacute, as well as left posterior tibial thrombosis. He was placed on Coumadin for anticoagulation. He presented to the [facility] ER this time with a complaint of increased weight gain as well as increased LEE and bilateral leg pain. He also noted pain and bleeding from both feet that started the day he was admitted. He noted ~8 lb weight gain in the 2 days prior. He had mild SOB. He noted chest pain intermittently for the past several weeks that is relieved with SL NTG. He stated that he had recent problems getting his medications and had increased chest pain without his Ranexa. He also stated that he was without most of his other meds for about 2 weeks, but has them now. Work up in the ED revealed mildly elevated BNP ~800. CXR showed no signs of pulmonary edema. EKG without acute changes. Troponin negative x 1. The ER physician did state that the patient  became tachycardic with a HR in the 140s upon standing. It was sinus rhythm. He had associated SOB at the time, but denied any other symptoms.   The patient was admitted for diuresis, wound care and pain control.  A wound consult was requested and they recommended triamcinolone cream with Eucerin three times daily.  He was given two does of IV lasix but then became hypotensive in the 80's.  Hydralazine was reduced.  SCr also increased from 1.38 to 2.14 and eventually peaked at 3.83 before  returning to baseline 1.3-1.4.  He was given fluids and also restarted on lasix per nephrology. He was eventually restarted on PO lasix 60mg  BID.  He also had transaminitis when CMP was drawn with resolution as well.  Leg pain and edema improved.  Abdominal US revealed cholelithiasis and diffusely heterogeneous liver compatible with diffuse hepatic parenchymal disease.  AST was > 3x ALT suggesting possible alcohol induced hepatitis.  Hepatitis panel was negative. Statin and fenofibrate were held. The patient also became more anemic with hgb dropping from 10.6 to 7.5.  Coumadin was discontinued.  He was transfused PRBCs and hgb increased to 8.8 and stabilized around 9.5.  GI consult was requested and the patient ultimately underwent EGD and colonoscopy which revealed 3cm hiatal hernia, four colon polyps and diverticulosis but no source of bleeding.  Coumadin was resumed.  Alcohol counseling was discussed, offered and declined however, CSW was able to provide information regarding treatment if he changed his mind.  [facility] will be providing RN and MSW survices;  The patient was seen by Dr. Herbie Baltimore who felt he was stable for DC home.  Follow up was arranged.    Consults: Wound Care, Nephrology, GI, CSW  Significant Diagnostic Studies: EGD ENDOSCOPIC IMPRESSION:  1. 3 cm hiatal hernia  2. The mucosa of the stomach appeared normal  3. The duodenal mucosa showed no abnormalities  nothing to account for GIB  RECOMMENDATIONS:  Await pathology results  colonoscopy  REPEAT EXAM: for EGD pending biopsy results.  eSigned: Hart Carwin, MD 01/24/2013 10:04 AM  Colonoscopy ENDOSCOPIC IMPRESSION:  1. Four sessile polyps ranging between 5-25mm in size were found in  the descending colon and at the cecum; polypectomy was performed  with cold forceps and using snare cautery  2. Ulcer at the cecum; biopsy of the area was performed using cold  forceps  3. There was moderate diverticulosis noted in  the sigmoid colon  and throughout the entire examined colon  bleeding likely caused by cecal ulcer , ethiology not clear,  possibly ischemic or med related, it shows no stigmata of recent  bleeding  RECOMMENDATIONS:  Await biopsy results  resume diet,  Recall colonoscopy pending pathology report  eSigned: Hart Carwin, MD 01/24/2013 10:12 AM  PORTABLE CHEST - 1 VIEW  Comparison: 11/29/2012  Findings: Prior median sternotomy. Patient minimally rotated left. Numerous leads and wires project over the chest. Remote bilateral rib fractures.  Borderline cardiomegaly. No pleural effusion or pneumothorax. No congestive failure.  Clear lungs.  IMPRESSION: Borderline cardiomegaly, without acute disease.   ABDOMINAL ULTRASOUND COMPLETE Comparison: None.  Findings:  Gallbladder: Gallstones are seen towards the neck of the gallbladder. The largest is 1.4 cm. No Murphy's sign or pericholecystic fluid.  Common Bile Duct: Within normal limits in caliber.  Liver: The liver is diffusely heterogeneous without focal mass.The left lobe is obscured by overlying bowel gas.  IVC: Suboptimally visualized. Grossly within normal limits.  Pancreas: Obscured by overlying bowel  gas.  Spleen: Within normal limits in size and echotexture.  Right kidney: Normal in size and parenchymal echogenicity. No evidence of mass or hydronephrosis.  Left kidney: Normal in size and parenchymal echogenicity. No evidence of mass or hydronephrosis.  Abdominal Aorta: Obscured by overlying bowel gas.  IMPRESSION: Limited visualization of the pancreas, aorta, and left lobe of the liver.  Cholelithiasis.  The liver is diffusely heterogeneous compatible with diffuse hepatic parenchymal disease.  CBC    Component Value Date/Time   WBC 9.1 02/01/2013 0417   RBC 3.05* 02/01/2013 0417   HGB 9.3* 02/01/2013 0417   HCT 27.4* 02/01/2013 0417   PLT 200 02/01/2013 0417   MCV 89.8 02/01/2013 0417   MCH 30.5 02/01/2013  0417   MCHC 33.9 02/01/2013 0417   RDW 16.3* 02/01/2013 0417   LYMPHSABS 0.8 01/16/2013 1242   MONOABS 0.6 01/16/2013 1242   EOSABS 0.6 01/16/2013 1242   BASOSABS 0.0 01/16/2013 1242   BMET    Component Value Date/Time   NA 137 02/01/2013 0417   K 3.4* 02/01/2013 0417   CL 101 02/01/2013 0417   CO2 27 02/01/2013 0417   GLUCOSE 109* 02/01/2013 0417   BUN 16 02/01/2013 0417   CREATININE 1.45* 02/01/2013 0417   CALCIUM 8.9 02/01/2013 0417   GFRNONAA 49* 02/01/2013 0417   GFRAA 57* 02/01/2013 0417   Treatments:   Discharge Exam: Blood pressure 100/67, pulse 83, temperature 98.4 F (36.9 C), temperature source Oral, resp. rate 20, height 5\' 9"  (1.753 m), weight 197 lb 1.5 oz (89.4 kg), SpO2 100.00%.   Disposition: 01-Home or Self Care  Discharge Orders   Future Appointments Provider Department Dept Phone   02/06/2013 10:00 AM [name], PA-C [facility] [place] [phone]   Future Orders Complete By Expires     Diet - low sodium heart healthy  As directed     Increase activity slowly  As directed         Medication List    STOP taking these medications       acitretin 25 MG capsule  Commonly known as:  SORIATANE     Fenofibric Acid 135 MG Cpdr      TAKE these medications       aspirin EC 81 MG tablet  Take 81 mg by mouth daily.     atorvastatin 20 MG tablet  Commonly known as:  LIPITOR  Take 20 mg by mouth at bedtime.     carvedilol 25 MG tablet  Commonly known as:  COREG  Take 1 tablet (25 mg total) by mouth 2 (two) times daily with a meal.     dorzolamide-timolol 22.3-6.8 MG/ML ophthalmic solution  Commonly known as:  COSOPT  Place 1 drop into both eyes at bedtime.     fenofibrate 160 MG tablet  Take 160 mg by mouth daily.     folic acid 1 MG tablet  Commonly known as:  FOLVITE  Take 1 tablet (1 mg total) by mouth daily.     furosemide 20 MG tablet  Commonly known as:  LASIX  Take 3 tablets (60 mg total) by mouth 2 (two) times daily.      hydrALAZINE 25 MG tablet  Commonly known as:  APRESOLINE  Take 1.5 tablets (37.5 mg total) by mouth 2 (two) times daily.     hydrocortisone cream 0.5 %  Apply 1 application topically 2 (two) times daily.     hydrOXYzine 10 MG tablet  Commonly known  as:  ATARAX/VISTARIL  Take 10 mg by mouth 3 (three) times daily as needed for itching.     isosorbide mononitrate 30 MG 24 hr tablet  Commonly known as:  IMDUR  Take 1-2 tablets (30-60 mg total) by mouth daily.     latanoprost 0.005 % ophthalmic solution  Commonly known as:  XALATAN  1 drop at bedtime.     multivitamin with minerals Tabs  Take 1 tablet by mouth daily.     nitroGLYCERIN 0.4 MG SL tablet  Commonly known as:  NITROSTAT  Place 1 tablet (0.4 mg total) under the tongue every 5 (five) minutes as needed for chest pain (hold for SBP < 110).     oxyCODONE 5 MG immediate release tablet  Commonly known as:  Oxy IR/ROXICODONE  Take 1 tablet (5 mg total) by mouth every 4 (four) hours as needed.     potassium chloride 10 MEQ tablet  Commonly known as:  K-DUR  Take 10 mEq by mouth daily.     thiamine 100 MG tablet  Take 1 tablet (100 mg total) by mouth daily.     triamcinolone 0.1 % cream : eucerin Crea  Apply 1 application topically 3 (three) times daily.     warfarin 5 MG tablet  Commonly known as:  COUMADIN  Take 5 mg by mouth daily.           Follow-up Information   Follow up with [name], PA-C. (Our office will call you with the appt date and time.)    Contact information:   [address] [place] [address] [phone]       Follow up with LUPTON III, Mart Piggs, MD. (Call for appt.)    Contact information:   [address] [place] [address] [phone]       Signed: Wilburt Finlay 02/03/2013, 10:47 AM

## 2013-02-05 ENCOUNTER — Encounter: Payer: Self-pay | Admitting: Cardiology

## 2013-02-06 ENCOUNTER — Encounter: Payer: Self-pay | Admitting: Cardiology

## 2013-02-06 ENCOUNTER — Ambulatory Visit (INDEPENDENT_AMBULATORY_CARE_PROVIDER_SITE_OTHER): Payer: Medicare Other | Admitting: Cardiology

## 2013-02-06 VITALS — BP 110/62 | HR 80 | Ht 69.0 in | Wt 207.7 lb

## 2013-02-06 DIAGNOSIS — I255 Ischemic cardiomyopathy: Secondary | ICD-10-CM

## 2013-02-06 DIAGNOSIS — I2510 Atherosclerotic heart disease of native coronary artery without angina pectoris: Secondary | ICD-10-CM

## 2013-02-06 DIAGNOSIS — R600 Localized edema: Secondary | ICD-10-CM

## 2013-02-06 DIAGNOSIS — I5043 Acute on chronic combined systolic (congestive) and diastolic (congestive) heart failure: Secondary | ICD-10-CM

## 2013-02-06 DIAGNOSIS — I82403 Acute embolism and thrombosis of unspecified deep veins of lower extremity, bilateral: Secondary | ICD-10-CM

## 2013-02-06 DIAGNOSIS — N189 Chronic kidney disease, unspecified: Secondary | ICD-10-CM

## 2013-02-06 DIAGNOSIS — I10 Essential (primary) hypertension: Secondary | ICD-10-CM

## 2013-02-06 DIAGNOSIS — N179 Acute kidney failure, unspecified: Secondary | ICD-10-CM

## 2013-02-06 DIAGNOSIS — R609 Edema, unspecified: Secondary | ICD-10-CM

## 2013-02-06 DIAGNOSIS — I82409 Acute embolism and thrombosis of unspecified deep veins of unspecified lower extremity: Secondary | ICD-10-CM

## 2013-02-06 DIAGNOSIS — I2589 Other forms of chronic ischemic heart disease: Secondary | ICD-10-CM

## 2013-02-06 DIAGNOSIS — Z7901 Long term (current) use of anticoagulants: Secondary | ICD-10-CM

## 2013-02-06 NOTE — Assessment & Plan Note (Signed)
Discharged 02/01/13. TCM follow up pt

## 2013-02-06 NOTE — Patient Instructions (Signed)
Increase lasix to 80mg  twice a day for 3 days then go back to 60mg  twice a day. Lab today. Follow up in one week.

## 2013-02-06 NOTE — Assessment & Plan Note (Signed)
Chronic systolic and diastolic dysfunction

## 2013-02-06 NOTE — Assessment & Plan Note (Signed)
Dr Loleta [place] follows

## 2013-02-06 NOTE — Progress Notes (Signed)
02/06/2013 Devin Williams   10/02/1945  [id number]  Primary Physicia [name], MD Primary Cardiologist: Dr Herbie Baltimore  HPI:  Complicated, noncompliant pt who was just discharged after a two week hospitalization for acute on chronic diastolic CHF as well as anemia requiring transfusion, and acute on chronic renal insufficiency. He is her for TCM follow up. He admits he is not compliant with low sodium diet and adds salt to his food. His weight is up from discharge from 197- 207. He has noted increasing LE edema but denies increased SOB.    Current Outpatient Prescriptions  Medication Sig Dispense Refill  . aspirin EC 81 MG tablet Take 81 mg by mouth daily.      [name] atorvastatin (LIPITOR) 20 MG tablet Take 20 mg by mouth at bedtime.      . carvedilol (COREG) 25 MG tablet Take 1 tablet (25 mg total) by mouth 2 (two) times daily with a meal.  62 tablet  0  . dorzolamide-timolol (COSOPT) 22.3-6.8 MG/ML ophthalmic solution Place 1 drop into both eyes at bedtime.      . fenofibrate 160 MG tablet Take 160 mg by mouth daily.      . folic acid (FOLVITE) 1 MG tablet Take 1 tablet (1 mg total) by mouth daily.  30 tablet  5  . furosemide (LASIX) 20 MG tablet Take 3 tablets (60 mg total) by mouth 2 (two) times daily.  120 tablet  5  . hydrALAZINE (APRESOLINE) 25 MG tablet Take 1.5 tablets (37.5 mg total) by mouth 2 (two) times daily.  90 tablet  5  . hydrocortisone cream 0.5 % Apply 1 application topically 2 (two) times daily.      . hydrOXYzine (ATARAX/VISTARIL) 10 MG tablet Take 10 mg by mouth 3 (three) times daily as needed for itching.      . isosorbide mononitrate (IMDUR) 30 MG 24 hr tablet Take 1-2 tablets (30-60 mg total) by mouth daily.  90 tablet  5  . latanoprost (XALATAN) 0.005 % ophthalmic solution 1 drop at bedtime.      . Multiple Vitamin (MULTIVITAMIN WITH MINERALS) TABS Take 1 tablet by mouth daily.      . nitroGLYCERIN (NITROSTAT) 0.4 MG SL tablet Place 1 tablet (0.4 mg total) under the  tongue every 5 (five) minutes as needed for chest pain (hold for SBP < 110).  20 tablet  0  . oxyCODONE (OXY IR/ROXICODONE) 5 MG immediate release tablet Take 1 tablet (5 mg total) by mouth every 4 (four) hours as needed.  15 tablet  0  . potassium chloride (K-DUR) 10 MEQ tablet Take 10 mEq by mouth daily.      [name] RANEXA 500 MG 12 hr tablet Take 500 mg by mouth daily.      [name] thiamine 100 MG tablet Take 1 tablet (100 mg total) by mouth daily.  30 tablet  5  . Triamcinolone Acetonide (TRIAMCINOLONE 0.1 % CREAM : EUCERIN) CREA Apply 1 application topically 3 (three) times daily.  1 each  3  . warfarin (COUMADIN) 5 MG tablet Take 5 mg by mouth daily.       No current facility-administered medications for this visit.    Allergies  Allergen Reactions  . Penicillins     History   Social History  . Marital Status: Married    Spouse Name: N/A    Number of Children: N/A  . Years of Education: N/A   Occupational History  . Not on file.   Social  History Main Topics  . Smoking status: Former Smoker -- 0.50 packs/day    Types: Cigarettes    Quit date: 11/17/2000  . Smokeless tobacco: Never Used     Comment: 67 yo  . Alcohol Use: 24.0 oz/week    40 Shots of liquor per week     Comment: when I drink I drink for months drinkin g for the past 2 months, last drink 3/27.  Drinks a pint and half.    . Drug Use: No  . Sexually Active: Not on file   Other Topics Concern  . Not on file   Social History Narrative   Lives at home 2 story house, normally does not use an assist device, but borrowed his wife's cane recently.  Previously drove.       Review of Systems: General: negative for chills, fever, night sweats or weight changes.  Cardiovascular: negative for chest pain, dyspnea on exertion, orthopnea, palpitations, paroxysmal nocturnal dyspnea or shortness of breath Dermatological: negative for rash, chronic venous edema skin changes Respiratory: negative for cough or wheezing Urologic:  negative for hematuria Abdominal: negative for nausea, vomiting, diarrhea, bright red blood per rectum, melena, or hematemesis Neurologic: negative for visual changes, syncope, or dizziness All other systems reviewed and are otherwise negative except as noted above.    Blood pressure 110/62, pulse 80, height 5\' 9"  (1.753 m), weight 207 lb 11.2 oz (94.212 kg).  General appearance: alert, cooperative, no distress and moderately obese Neck: no carotid bruit and no JVD Lungs: clear to auscultation bilaterally Heart: regular rate and rhythm Extrem: chronic venous edema changes, 2+ edmea on Rt , 1+ on Lt.   ASSESSMENT AND PLAN:   Acute on chronic combined systolic and diastolic heart failure Discharged 02/01/13. TCM follow up pt  Acute on chronic renal failure, transient May 2014 SCr 1.49 on 6/1  CAD, 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 Last cath 2008 - med Rx. Negative Myoview 5/12 No angina  Chronic anticoagulation Dr Loleta Chance follows  HTN (hypertension) controlled  DVT (deep venous thrombosis) March 2014 On Coumadin  Cardiomyopathy, ischemic - EF 45-50% March 2014 Chronic systolic and diastolic dysfunction  Bilateral lower extremity edema "A Little worse since discharge"   PLAN  Increase Lasix to 80mg  BID X 3 days, follow up CMET done by primary MD 5 days ago. Early follow up next week, high risk for re admission.   Chi Garlow KPA-C 02/06/2013 11:24 AM

## 2013-02-06 NOTE — Assessment & Plan Note (Signed)
No angina 

## 2013-02-06 NOTE — Assessment & Plan Note (Signed)
SCr 1.49 on 6/1

## 2013-02-06 NOTE — Assessment & Plan Note (Signed)
On Coumadin 

## 2013-02-06 NOTE — Assessment & Plan Note (Signed)
controlled 

## 2013-02-06 NOTE — Assessment & Plan Note (Signed)
"  A Little worse since discharge"

## 2013-02-11 ENCOUNTER — Encounter: Payer: Self-pay | Admitting: Cardiology

## 2013-02-12 ENCOUNTER — Ambulatory Visit (INDEPENDENT_AMBULATORY_CARE_PROVIDER_SITE_OTHER): Payer: Medicare Other | Admitting: Cardiology

## 2013-02-12 ENCOUNTER — Encounter: Payer: Self-pay | Admitting: Cardiology

## 2013-02-12 VITALS — BP 138/60 | HR 84 | Temp 97.4°F | Ht 68.0 in | Wt 203.9 lb

## 2013-02-12 DIAGNOSIS — I5043 Acute on chronic combined systolic (congestive) and diastolic (congestive) heart failure: Secondary | ICD-10-CM

## 2013-02-12 DIAGNOSIS — N183 Chronic kidney disease, stage 3 unspecified: Secondary | ICD-10-CM | POA: Insufficient documentation

## 2013-02-12 NOTE — Patient Instructions (Signed)
Same dose of Lasix. Follow up in 4 weeks. Avoid salt.

## 2013-02-12 NOTE — Progress Notes (Signed)
02/12/2013 Devin Williams   [birth year]  [id number]  Primary Physicia [name], MD Primary Cardiologist: Dr Herbie Baltimore  HPI:  67 y/o pt who was just discharged after a two week hospitalization for acute on chronic diastolic CHF as well as anemia requiring transfusion, and acute on chronic renal insufficiency. He was seen  for TCM follow up 02/04/13.  He admited he was not compliant with low sodium diet and adds salt to his food. His weight was up from discharge from 197 to 207. He has noted increasing LE edema but denied increased SOB. We increased his Lasix to 80mg  BID for 3 days and he is seen now for follow up. He is doing well. His edema is better. He says he is being compliant with his diet.    Current Outpatient Prescriptions  Medication Sig Dispense Refill  . aspirin EC 81 MG tablet Take 81 mg by mouth daily.      [name] atorvastatin (LIPITOR) 20 MG tablet Take 20 mg by mouth at bedtime.      . carvedilol (COREG) 25 MG tablet Take 1 tablet (25 mg total) by mouth 2 (two) times daily with a meal.  62 tablet  0  . dorzolamide-timolol (COSOPT) 22.3-6.8 MG/ML ophthalmic solution Place 1 drop into both eyes at bedtime.      . fenofibrate 160 MG tablet Take 160 mg by mouth daily.      . folic acid (FOLVITE) 1 MG tablet Take 1 tablet (1 mg total) by mouth daily.  30 tablet  5  . furosemide (LASIX) 20 MG tablet Take 3 tablets (60 mg total) by mouth 2 (two) times daily.  120 tablet  5  . hydrALAZINE (APRESOLINE) 25 MG tablet Take 1.5 tablets (37.5 mg total) by mouth 2 (two) times daily.  90 tablet  5  . hydrocortisone cream 0.5 % Apply 1 application topically 2 (two) times daily.      . hydrOXYzine (ATARAX/VISTARIL) 10 MG tablet Take 10 mg by mouth 3 (three) times daily as needed for itching.      . isosorbide mononitrate (IMDUR) 30 MG 24 hr tablet Take 1-2 tablets (30-60 mg total) by mouth daily.  90 tablet  5  . latanoprost (XALATAN) 0.005 % ophthalmic solution 1 drop at bedtime.      . Multiple  Vitamin (MULTIVITAMIN WITH MINERALS) TABS Take 1 tablet by mouth daily.      . nitroGLYCERIN (NITROSTAT) 0.4 MG SL tablet Place 1 tablet (0.4 mg total) under the tongue every 5 (five) minutes as needed for chest pain (hold for SBP < 110).  20 tablet  0  . oxyCODONE (OXY IR/ROXICODONE) 5 MG immediate release tablet Take 1 tablet (5 mg total) by mouth every 4 (four) hours as needed.  15 tablet  0  . potassium chloride (K-DUR) 10 MEQ tablet Take 10 mEq by mouth daily.      [name] RANEXA 500 MG 12 hr tablet Take 500 mg by mouth daily.      [name] thiamine 100 MG tablet Take 1 tablet (100 mg total) by mouth daily.  30 tablet  5  . Triamcinolone Acetonide (TRIAMCINOLONE 0.1 % CREAM : EUCERIN) CREA Apply 1 application topically 3 (three) times daily.  1 each  3  . warfarin (COUMADIN) 5 MG tablet Take 5 mg by mouth daily.       No current facility-administered medications for this visit.    Allergies  Allergen Reactions  . Penicillins     History  Social History  . Marital Status: Married    Spouse Name: N/A    Number of Children: N/A  . Years of Education: N/A   Occupational History  . Not on file.   Social History Main Topics  . Smoking status: Former Smoker -- 0.50 packs/day    Types: Cigarettes    Quit date: 11/17/2000  . Smokeless tobacco: Never Used     Comment: 67 yo  . Alcohol Use: 24.0 oz/week    40 Shots of liquor per week     Comment: when I drink I drink for months drinkin g for the past 2 months, last drink 3/27.  Drinks a pint and half.    . Drug Use: No  . Sexually Active: Not on file   Other Topics Concern  . Not on file   Social History Narrative   Lives at home 2 story house, normally does not use an assist device, but borrowed his wife's cane recently.  Previously drove.       Review of Systems: General: negative for chills, fever, night sweats or weight changes.  Cardiovascular: negative for chest pain, dyspnea on exertion, edema, orthopnea, palpitations,  paroxysmal nocturnal dyspnea or shortness of breath Dermatological: negative for rash Respiratory: negative for cough or wheezing Urologic: negative for hematuria Abdominal: negative for nausea, vomiting, diarrhea, bright red blood per rectum, melena, or hematemesis Neurologic: negative for visual changes, syncope, or dizziness All other systems reviewed and are otherwise negative except as noted above.    Blood pressure 138/60, pulse 84, temperature 97.4 F (36.3 C), temperature source Oral, height 5\' 8"  (1.727 m), weight 203 lb 14.4 oz (92.488 kg).  General appearance: alert, cooperative, no distress and disheveled, unkept Lungs: clear to auscultation bilaterally Heart: regular rate and rhythm Extrem: Chronic venous edema changes, less edema than last week. now trace edema.  EKG  EKG: normal EKG, normal sinus rhythm, unchanged from previous tracings.  ASSESSMENT AND PLAN:   Acute on chronic combined systolic and diastolic heart failure He is her for follow up after we bumped his diuretics for a few days. His wgt went from 207 on 6/4 to 203 today/ His hospital discharge wgt was 197. He seems stable today at 203.   Chronic renal insufficiency, stage III (moderate) Last Scr 1.4    PLAN  Same Rx. Follow up in 4 weeks. He will need a BMP and CBC at that time. He is on chronic Coumadin for DVT, March 2014.   Perian Tedder KPA-C 02/12/2013 9:59 AM

## 2013-02-12 NOTE — Assessment & Plan Note (Signed)
He is her for follow up after we bumped his diuretics for a few days. His wgt went from 207 on 6/4 to 203 today/ His hospital discharge wgt was 197. He seems stable today at 203.

## 2013-02-12 NOTE — Assessment & Plan Note (Signed)
Last Scr 1.4

## 2013-03-05 ENCOUNTER — Encounter: Payer: Self-pay | Admitting: Cardiology

## 2013-03-05 ENCOUNTER — Ambulatory Visit (INDEPENDENT_AMBULATORY_CARE_PROVIDER_SITE_OTHER): Payer: Medicare Other | Admitting: Cardiology

## 2013-03-05 VITALS — BP 140/60 | HR 76 | Ht 68.0 in | Wt 198.0 lb

## 2013-03-05 DIAGNOSIS — R600 Localized edema: Secondary | ICD-10-CM

## 2013-03-05 DIAGNOSIS — L408 Other psoriasis: Secondary | ICD-10-CM

## 2013-03-05 DIAGNOSIS — R609 Edema, unspecified: Secondary | ICD-10-CM

## 2013-03-05 DIAGNOSIS — I82409 Acute embolism and thrombosis of unspecified deep veins of unspecified lower extremity: Secondary | ICD-10-CM

## 2013-03-05 DIAGNOSIS — I82403 Acute embolism and thrombosis of unspecified deep veins of lower extremity, bilateral: Secondary | ICD-10-CM

## 2013-03-05 DIAGNOSIS — D649 Anemia, unspecified: Secondary | ICD-10-CM

## 2013-03-05 DIAGNOSIS — Z7901 Long term (current) use of anticoagulants: Secondary | ICD-10-CM

## 2013-03-05 DIAGNOSIS — I10 Essential (primary) hypertension: Secondary | ICD-10-CM

## 2013-03-05 DIAGNOSIS — I5043 Acute on chronic combined systolic (congestive) and diastolic (congestive) heart failure: Secondary | ICD-10-CM

## 2013-03-05 DIAGNOSIS — I2510 Atherosclerotic heart disease of native coronary artery without angina pectoris: Secondary | ICD-10-CM

## 2013-03-05 DIAGNOSIS — L409 Psoriasis, unspecified: Secondary | ICD-10-CM

## 2013-03-05 LAB — CBC
HCT: 29.1 % — ABNORMAL LOW (ref 39.0–52.0)
Hemoglobin: 9.9 g/dL — ABNORMAL LOW (ref 13.0–17.0)
MCV: 87.7 fL (ref 78.0–100.0)
RBC: 3.32 MIL/uL — ABNORMAL LOW (ref 4.22–5.81)
RDW: 14.5 % (ref 11.5–15.5)
WBC: 8 10*3/uL (ref 4.0–10.5)

## 2013-03-05 LAB — BASIC METABOLIC PANEL WITH GFR
CO2: 27 mEq/L (ref 19–32)
Chloride: 100 mEq/L (ref 96–112)
Creat: 2.58 mg/dL — ABNORMAL HIGH (ref 0.50–1.35)
GFR, Est Non African American: 25 mL/min — ABNORMAL LOW
Potassium: 4.8 mEq/L (ref 3.5–5.3)

## 2013-03-05 NOTE — Assessment & Plan Note (Signed)
No chest pain no shortness of breath.

## 2013-03-05 NOTE — Assessment & Plan Note (Signed)
Improved, currently more chronic systolic heart failure. No change in medications we will check a basic metabolic panel Pro BNP.  Follow with Dr. Herbie Baltimore in 2 weeks.  Patient has monitored his heating habits more recently as well.

## 2013-03-05 NOTE — Assessment & Plan Note (Signed)
Stable

## 2013-03-05 NOTE — Assessment & Plan Note (Signed)
Followed by Dr. Adaline Sill office.

## 2013-03-05 NOTE — Patient Instructions (Addendum)
Weigh daily Call [phone] if weight climbs more than 3 pounds in a day or 5 pounds in a week. No salt to very little salt in your diet.  No more than 2000 mg in a day. Call if increased shortness of breath or increased swelling.   Have lab work done today  Follow up with Dr. Herbie Baltimore in 2 weeks.  Call if problems or questions.  Continue to have coumadin checked with Dr. Loleta Chance

## 2013-03-05 NOTE — Assessment & Plan Note (Signed)
Much improved from hospitalization

## 2013-03-05 NOTE — Assessment & Plan Note (Signed)
On Coumadin, possibly small pulmonary embolus at the same time that was not found on CT.

## 2013-03-05 NOTE — Progress Notes (Signed)
03/05/2013   PCP: [name], MD   Chief Complaint  Patient presents with  . Congestive Heart Failure    follow up appt    Primary Cardiologist: Dr. Herbie Baltimore  HPI:  67 year old African American male at times noncompliant and previously had indulged in alcohol abuse though currently is not drinking was recently hospitalized for chest pain, edema and congestive heart failure.  He is on anticoagulation secondary to bilateral DVTs no evidence of PE but Dr. Herbie Baltimore felt he may have had a small PE.  Drain that hospitalization he was transfused for anemia and was found to have a small gastritis ulcer area on EGD. He was diuresed. His psoriasis was pretty significant during that hospitalization but improved and today it looks much better.  Since discharge he has seen Corine Shelter PA back twice both times he had continued edema and medications were adjusted. Also his diet was discussed, as patient can be noncompliant.  Cardiac hx: 1. Coronary artery disease, CABG in January 1999: LIMA-LAD, SVG-OM1-OM2 sequential, SVG-ramus  intermedius, SVG-RCA.  a. Most recent catheterization, June 2008: RCA proximally occluded, filled via collaterals from the left; distal left main 80% to 90% with the LAD mid occlusion at D1 bifurcation. Circumflex proximally occluded but the AV branch provided collaterals to the RCA. Distal circumflex perfused via SVG-OM1-OM2, gave retrograde flow to the OM3. Ramus intermedius occluded proximally, filled by collaterals from the circumflex. SVG-RCA and SVG-diagonal and SVG-ramus were all occluded. LIMA-LAD was patent. Therefore, only the sequential SVGs to OM1 and OM2 and the LIMA-LAD were patent.  b. Recent echo EF of 45% to 50%, which was down from before. There was moderate aortic  sclerosis but no stenosis and the left atrium was noted to be dilated on that previous study. The right ventricle was dilated on the one in March.  c. Persantine Myoview, May 2012:  Inferior attenuation, low risk and no ischemia, either due to infarction or scar but no ischemia noted.  2. Chronic stable angina had previously been well controlled   Today patient is much improved no shortness of breath minimal edema and no complaints. No chest pain no shortness of breath.  His weight continues to decrease last office visit on June 12 he was 203 pounds today for 198 pounds.  Allergies  Allergen Reactions  . Penicillins     Current Outpatient Prescriptions  Medication Sig Dispense Refill  . aspirin EC 81 MG tablet Take 81 mg by mouth daily.      [name] atorvastatin (LIPITOR) 20 MG tablet Take 20 mg by mouth at bedtime.      . carvedilol (COREG) 25 MG tablet Take 1 tablet (25 mg total) by mouth 2 (two) times daily with a meal.  62 tablet  0  . dorzolamide-timolol (COSOPT) 22.3-6.8 MG/ML ophthalmic solution Place 1 drop into both eyes at bedtime.      . fenofibrate 160 MG tablet Take 160 mg by mouth daily.      . folic acid (FOLVITE) 1 MG tablet Take 1 tablet (1 mg total) by mouth daily.  30 tablet  5  . furosemide (LASIX) 20 MG tablet Take 3 tablets (60 mg total) by mouth 2 (two) times daily.  120 tablet  5  . hydrALAZINE (APRESOLINE) 25 MG tablet Take 1.5 tablets (37.5 mg total) by mouth 2 (two) times daily.  90 tablet  5  . hydrocortisone cream 0.5 % Apply 1 application topically 2 (two) times daily.      [name]  hydrOXYzine (ATARAX/VISTARIL) 10 MG tablet Take 10 mg by mouth 3 (three) times daily as needed for itching.      . isosorbide mononitrate (IMDUR) 30 MG 24 hr tablet Take 1-2 tablets (30-60 mg total) by mouth daily.  90 tablet  5  . KLOR-CON M20 20 MEQ tablet       . latanoprost (XALATAN) 0.005 % ophthalmic solution 1 drop at bedtime.      . MEPHYTON 5 MG tablet       . Multiple Vitamin (MULTIVITAMIN WITH MINERALS) TABS Take 1 tablet by mouth daily.      . nitroGLYCERIN (NITROSTAT) 0.4 MG SL tablet Place 1 tablet (0.4 mg total) under the tongue every 5 (five) minutes as  needed for chest pain (hold for SBP < 110).  20 tablet  0  . oxyCODONE (OXY IR/ROXICODONE) 5 MG immediate release tablet Take 1 tablet (5 mg total) by mouth every 4 (four) hours as needed.  15 tablet  0  . potassium chloride (K-DUR) 10 MEQ tablet Take 10 mEq by mouth daily.      [name] RANEXA 500 MG 12 hr tablet Take 500 mg by mouth daily.      [name] thiamine 100 MG tablet Take 1 tablet (100 mg total) by mouth daily.  30 tablet  5  . Triamcinolone Acetonide (TRIAMCINOLONE 0.1 % CREAM : EUCERIN) CREA Apply 1 application topically 3 (three) times daily.  1 each  3  . warfarin (COUMADIN) 5 MG tablet Take 5 mg by mouth daily.       No current facility-administered medications for this visit.    Past Medical History  Diagnosis Date  . Hypertension   . DVT (deep venous thrombosis) 11/2012    bilateral  . Psoriasis   . Chronic kidney disease     CKD 3  . Coronary artery disease 01/16/2011    CABG X 5 '99, Myoview low risk 5/12  . Hyperlipidemia   . Alcohol abuse, daily use   . Diastolic dysfunction March 2014    EF 45% by echo  . GERD (gastroesophageal reflux disease)   . Optic nerve ischemia   . Problems with hearing   . Vision problems   . Carotid bruit 03/20/2007    Carotid doppler - R & L bulbs and proximal ICA -  irregular nonhemodynamically significant plaque of 0-49% diameter reduction  . Edema   . Hypoalbuminemia   . Sinus tachycardia   . Hx-TIA (transient ischemic attack)   . Barrett's esophagus 01/29/2013    Past Surgical History  Procedure Laterality Date  . Knee surgery    . Appendectomy    . 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 Cardiac catheterization  02/14/2007    saphenous veingraft to RCA, saphenous vein graft to D1 and saphenous vein graft to ramus are occluded; native RCA is occluded and collateralized from L system; L main is severly diffusely diffuse; LAD is occluded after origin of a small diagonal 1;  circumflex is occluded after origin of small AV groove branch circumflex  . Colonoscopy N/A 01/24/2013    Procedure: COLONOSCOPY;  Surgeon: Hart Carwin, MD;  Location[facility] END[facility]COPY;  Service: Endoscopy;  Laterality: N/A;  . Esophagogastroduodenoscopy N/A 01/24/2013    Procedure: ESOPHAGOGASTRODUODENOSCOPY (EGD);  Surgeon: Hart Carwin, MD;  Location: [facility] ENDOSCOPY;  Service: Endoscopy;  Laterality: N/A;    ZOX:[id number]:UJ colds or fevers, continued weight decrease Skin:no rashes or ulcers,  severe psoriasis currently controlled HEENT:no blurred vision, no congestion CV:see HPI PUL:see HPI GI:no diarrhea constipation or melena, no indigestion GU:no hematuria, no dysuria MS:no joint pain, no claudication Neuro:no syncope, no lightheadedness Endo:no diabetes, no thyroid disease  PHYSICAL EXAM BP 140/60  Pulse 76  Ht 5\' 8"  (1.727 m)  Wt 198 lb (89.812 kg)  BMI 30.11 kg/m2 General:Pleasant affect, NAD Skin:Warm and dry, brisk capillary refill, no sloughing skin HEENT:normocephalic, sclera clear, mucus membranes moist Neck:supple, no JVD, no bruits  Heart:S1S2 RRR without murmur, gallup, rub or click Lungs:clear without rales, rhonchi, or wheezes RUE:AVWU, non tender, + BS, do not palpate liver spleen or masses Ext:no lower ext edema, 2+ pedal pulses, 2+ radial pulses Neuro:alert and oriented, MAE, follows commands, + facial symmetry   ASSESSMENT AND PLAN Acute on chronic combined systolic and diastolic heart failure Improved, currently more chronic systolic heart failure. No change in medications we will check a basic metabolic panel Pro BNP.  Follow with Dr. Herbie Baltimore in 2 weeks.  Patient has monitored his heating habits more recently as well.  HTN (hypertension) Stable  Chronic anticoagulation Followed by Dr. Adaline Sill office.  DVT (deep venous thrombosis) March 2014 On Coumadin, possibly small pulmonary embolus at the same time that was not found on CT.  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 Last  cath 2008 - med Rx. Negative Myoview 5/12 No chest pain no shortness of breath.  Bilateral lower extremity edema Combination of congestive heart failure as well as bilateral DVTs that they're much improved today with only 1+ to trace.  Psoriasis Much improved from hospitalization

## 2013-03-05 NOTE — Assessment & Plan Note (Signed)
Combination of congestive heart failure as well as bilateral DVTs that they're much improved today with only 1+ to trace.

## 2013-03-09 ENCOUNTER — Telehealth: Payer: Self-pay | Admitting: *Deleted

## 2013-03-09 DIAGNOSIS — Z79899 Other long term (current) drug therapy: Secondary | ICD-10-CM

## 2013-03-09 NOTE — Telephone Encounter (Signed)
Informed patient to get BMP done on Thursday.

## 2013-03-11 ENCOUNTER — Ambulatory Visit: Payer: Medicare Other | Admitting: Cardiology

## 2013-03-13 LAB — BASIC METABOLIC PANEL
BUN: 18 mg/dL (ref 6–23)
CO2: 29 mEq/L (ref 19–32)
Chloride: 101 mEq/L (ref 96–112)
Glucose, Bld: 73 mg/dL (ref 70–99)
Potassium: 4 mEq/L (ref 3.5–5.3)
Sodium: 134 mEq/L — ABNORMAL LOW (ref 135–145)

## 2013-03-19 ENCOUNTER — Encounter: Payer: Self-pay | Admitting: Cardiology

## 2013-03-19 ENCOUNTER — Ambulatory Visit (INDEPENDENT_AMBULATORY_CARE_PROVIDER_SITE_OTHER): Payer: Medicare Other | Admitting: Cardiology

## 2013-03-19 VITALS — BP 102/60 | HR 82 | Ht 69.0 in | Wt 207.7 lb

## 2013-03-19 DIAGNOSIS — I2089 Other forms of angina pectoris: Secondary | ICD-10-CM

## 2013-03-19 DIAGNOSIS — R609 Edema, unspecified: Secondary | ICD-10-CM

## 2013-03-19 DIAGNOSIS — I5043 Acute on chronic combined systolic (congestive) and diastolic (congestive) heart failure: Secondary | ICD-10-CM

## 2013-03-19 DIAGNOSIS — N189 Chronic kidney disease, unspecified: Secondary | ICD-10-CM

## 2013-03-19 DIAGNOSIS — E8809 Other disorders of plasma-protein metabolism, not elsewhere classified: Secondary | ICD-10-CM

## 2013-03-19 DIAGNOSIS — E785 Hyperlipidemia, unspecified: Secondary | ICD-10-CM

## 2013-03-19 DIAGNOSIS — I2589 Other forms of chronic ischemic heart disease: Secondary | ICD-10-CM

## 2013-03-19 DIAGNOSIS — R600 Localized edema: Secondary | ICD-10-CM

## 2013-03-19 DIAGNOSIS — I255 Ischemic cardiomyopathy: Secondary | ICD-10-CM

## 2013-03-19 DIAGNOSIS — D649 Anemia, unspecified: Secondary | ICD-10-CM

## 2013-03-19 DIAGNOSIS — Z7901 Long term (current) use of anticoagulants: Secondary | ICD-10-CM

## 2013-03-19 DIAGNOSIS — I2510 Atherosclerotic heart disease of native coronary artery without angina pectoris: Secondary | ICD-10-CM

## 2013-03-19 DIAGNOSIS — N179 Acute kidney failure, unspecified: Secondary | ICD-10-CM

## 2013-03-19 DIAGNOSIS — I208 Other forms of angina pectoris: Secondary | ICD-10-CM

## 2013-03-19 DIAGNOSIS — N2889 Other specified disorders of kidney and ureter: Secondary | ICD-10-CM

## 2013-03-19 DIAGNOSIS — IMO0001 Reserved for inherently not codable concepts without codable children: Secondary | ICD-10-CM

## 2013-03-19 DIAGNOSIS — F1020 Alcohol dependence, uncomplicated: Secondary | ICD-10-CM

## 2013-03-19 DIAGNOSIS — I10 Essential (primary) hypertension: Secondary | ICD-10-CM

## 2013-03-19 DIAGNOSIS — I209 Angina pectoris, unspecified: Secondary | ICD-10-CM

## 2013-03-19 DIAGNOSIS — I509 Heart failure, unspecified: Secondary | ICD-10-CM

## 2013-03-19 DIAGNOSIS — I130 Hypertensive heart and chronic kidney disease with heart failure and stage 1 through stage 4 chronic kidney disease, or unspecified chronic kidney disease: Secondary | ICD-10-CM

## 2013-03-19 DIAGNOSIS — I5042 Chronic combined systolic (congestive) and diastolic (congestive) heart failure: Secondary | ICD-10-CM

## 2013-03-19 NOTE — Progress Notes (Signed)
Patient ID: Devin Williams, male   DOB: 06-09-1946, 67 y.o.   MRN: [id number]  03/23/2013   PCP: [name], MD   Chief Complaint  Patient presents with  . Follow-up 2 weeks    Pain in legs when walking, swelling in legs and ankles, lightheadedness when he stands up quickly    Primary Cardiologist: Dr. 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  He is on anticoagulation secondary to bilateral DVTs with low probability PE on VQ (? if he may have had a small PE.  He also has severe native CAD with only 2 grafts remaining patent from his 1999 CABG.  He has chronic stable angina - notably improved with Ranexa and nitrate.  He was hospitalized for the last few weeks of May into early June for severe volume overload secondary to acute on chronic combined HF.  This hospitalization was complicated by significant anemia (found to have Barrett's Esophagus along with a small gastric ulcer) requiring transfusion, along with acute on chronic renal failure with cardiorenal syndrome.  His Psoriasis was totally out of control.  He was diuresed over several weeks & transfused.  His renal function did improve. His psoriasis was pretty significant during that hospitalization but improved and today it looks much better.  Since discharge he has seen Corine Shelter PA on 2 occasions.  He continued to have edema & his medications have been adjusted. Also his diet was discussed, as patient can be noncompliant.  Most recently, he saw  Nada Boozer most on 7/3, and his weight was finally at a stable baseline of ~198 lb.  Unfortunately, his renal function had worsened, with a Cr up to 2.58, pro BNP was ~600.  He was told to stop his lasix  He returns today for reassessment and follow-up from recent  labs & has noted significant increase in edema. His legs are tight & hurting, but he denies PND or Orhtopnea (suggesting a more RHF component).  He actually has not noted any recent episodes of angina at rest or with the extent of exertion that he is capable.  He no longer feels light-headed or woozy/dizzy.  No TIA/Amaurosis fugax or syncope/near syncope.  He denies any melena, hematochezia or hematuria.  He does note a decrease in UOP since stopping the lasix.  Allergies  Allergen Reactions  . Penicillins     Current Outpatient Prescriptions  Medication Sig Dispense Refill  . aspirin EC 81 MG tablet Take 81 mg by mouth daily.      [name] atorvastatin (LIPITOR) 20 MG tablet Take 20 mg by mouth at bedtime.      . carvedilol (COREG) 25 MG tablet Take 1 tablet (25 mg total) by mouth 2 (two) times daily with a meal.  62 tablet  0  . dorzolamide-timolol (COSOPT) 22.3-6.8 MG/ML ophthalmic solution Place 1 drop into both eyes at bedtime.      . fenofibrate 160 MG tablet Take 160 mg by mouth daily.      . folic acid (FOLVITE) 1 MG tablet Take 1 tablet (1 mg total) by mouth daily.  30 tablet  5  . furosemide (LASIX) 20 MG tablet Take 3 tablets (60 mg total) by mouth 2 (two) times daily.  120 tablet  5  . hydrALAZINE (APRESOLINE) 25 MG tablet Take 1.5  tablets (37.5 mg total) by mouth 2 (two) times daily.  90 tablet  5  . hydrocortisone cream 0.5 % Apply 1 application topically 2 (two) times daily.      . hydrOXYzine (ATARAX/VISTARIL) 10 MG tablet Take 10 mg by mouth 3 (three) times daily as needed for itching.      . isosorbide mononitrate (IMDUR) 30 MG 24 hr tablet Take 1-2 tablets (30-60 mg total) by mouth daily.  90 tablet  5  . KLOR-CON M20 20 MEQ tablet       . latanoprost (XALATAN) 0.005 % ophthalmic solution 1 drop at bedtime.      . MEPHYTON 5 MG tablet       . Multiple Vitamin (MULTIVITAMIN WITH MINERALS) TABS Take 1 tablet by mouth daily.      . nitroGLYCERIN (NITROSTAT) 0.4 MG SL tablet Place  1 tablet (0.4 mg total) under the tongue every 5 (five) minutes as needed for chest pain (hold for SBP < 110).  20 tablet  0  . oxyCODONE (OXY IR/ROXICODONE) 5 MG immediate release tablet Take 1 tablet (5 mg total) by mouth every 4 (four) hours as needed.  15 tablet  0  . potassium chloride (K-DUR) 10 MEQ tablet Take 10 mEq by mouth daily.      [name] RANEXA 500 MG 12 hr tablet Take 500 mg by mouth daily.      [name] thiamine 100 MG tablet Take 1 tablet (100 mg total) by mouth daily.  30 tablet  5  . Triamcinolone Acetonide (TRIAMCINOLONE 0.1 % CREAM : EUCERIN) CREA Apply 1 application topically 3 (three) times daily.  1 each  3  . warfarin (COUMADIN) 5 MG tablet Take 5 mg by mouth daily.      [name] acitretin (SORIATANE) 25 MG capsule Take as directed       No current facility-administered medications for this visit.    Past Medical History  Diagnosis Date  . Hypertension   . DVT (deep venous thrombosis) 11/2012    bilateral  . Psoriasis   . Chronic kidney disease     CKD 3  . CAD in native artery 1999    CABG X 5 '99, Myoview low risk 5/12  . Hyperlipidemia   . Alcohol abuse, daily use   . Diastolic dysfunction March 2014    EF 45% by echo  . GERD (gastroesophageal reflux disease)   . Optic nerve ischemia   . Problems with hearing   . Vision problems   . Carotid bruit 03/20/2007    Carotid doppler - R & L bulbs and proximal ICA -  irregular nonhemodynamically significant plaque of 0-49% diameter reduction  . Edema   . Hypoalbuminemia   . Sinus tachycardia   . Hx-TIA (transient ischemic attack)   . Barrett's esophagus 01/29/2013  . Stable angina     Chronic  . Cardiorenal syndrome with renal failure 03/23/2013  . Cardiomyopathy, ischemic - EF 45-50% March 2014 01/19/2013  . Chronic combined systolic and diastolic CHF, NYHA class 2    Past Surgical History  Procedure Laterality Date  . Doppler echocardiography      F of 45% to 50%, which was down from before. There was moderate aortic   .  Nm myoview ltd  01/2011    inferior attenuation, low risk and no ischemia, either due to infarction or scar but no ischemia noted.   . 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  Cardiac catheterization  02/14/2007    Native Vessels: RCA - 100%, LAD 100% at D1, Cx 100% in AV (AV Groov branch - collaterals fill distal RPL).  Grafts: LIMA-LAD & SVG-OM1-OM2 patent; SVG-RCA & SVG-Ramus occluded  . Colonoscopy N/A 01/24/2013    Procedure: COLONOSCOPY;  Surgeon: Hart Carwin, MD;  Location: [facility] ENDOSCOPY;  Service: Endoscopy;  Laterality: N/A;  . Esophagogastroduodenoscopy N/A 01/24/2013    Procedure: ESOPHAGOGASTRODUODENOSCOPY (EGD);  Surgeon: Hart Carwin, MD;  Location: [facility] ENDOSCOPY;  Service: Endoscopy;  Laterality: N/A;  . Knee surgery    . Appendectomy     YQM:VHQIONG:EX colds or fevers, Wgt gain (9 lb since last visi Skin:no rashes or ulcers, severe psoriasis currently controlled, due to restart Soriatine Otherwise negative by comprehensive review.  PHYSICAL EXAM BP 102/60  Pulse 82  Ht 5\' 9"  (1.753 m)  Wt 207 lb 11.2 oz (94.212 kg)  BMI 30.66 kg/m2 - 9 lb up General:Pleasant affect, NAD Skin:Warm and dry, brisk capillary refill, no sloughing skin; faded psoriasis plaques noted. HEENT:normocephalic, sclera clear, mucus membranes moist Neck:supple, no JVD, no bruits  Heart:S1S2 RRR without murmur, gallup, rub or click Lungs:clear without rales, rhonchi, or wheezes BMW:UXLK, non tender, + BS, do not palpate liver spleen or masses Ext: 3+ lower ext edema to hips, 2+ pedal pulses, 2+ radial pulses Neuro:alert and oriented, MAE, follows commands, + facial symmetry  ASSESSMENT AND PLAN Bilateral lower extremity edema - secondary to combined systolic & diastolic HF (with possible Right Sided HF component) Unfortunately with a catch 22 position here. His renal function got worse, so his diuretic was held, now his edema is worse.  I think that the weight he was down to was probably low for him at his dry weight. He probably need acute for about 202 pounds currently. His edema is significantly worse than reported by Ms. Annie Paras, NP.  To gradually restart his Lasix 80 mg twice a day for the next couple days of: 3 doses starting tonight, then go back to 60 mg twice a day. Will increase as has his BNP as well CMP next week just establish renal function is doing. He has gotten better and not worse. We'll also do a CBC status since his had a history of anemia.  Acute on chronic combined systolic and diastolic heart failure He separated regimen currently based on the fact his blood pressures as low as it is. He is on full dose carvedilol with good rate control of his heart rate. He is on hydralazine plus nitrate. And really is not having any left-sided heart failure type symptoms. Mostly all right-sided. 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 Hopefully this will improve as he is diuresed.  Acute on chronic renal failure, intermittent; with CardioRenal Syndrome We willDifficult scenario who is to have the right PDA. Clearly is volume overloaded and needs diuresis. His renal function improved after his lung status stable as last time. Reason to believe that was the removed the congestive nephropathy, the renal function will improve. Initially his renal function was exacerbated possibly by overdiuresis and hypotension, now all affected still elevated despite clearly increased volume, this is more likely a cardiorenal effect.   Closely. Renal function and electrolytes and labs next week. S. she is proven to have intermittent bouts of renal insufficiency, I think it he would probably benefit from following back up with nephrology. He was seen briefly in the hospital do to his anemia. I think the plan  was for him to be seen as an outpatient, so called ahead and refer him. He was seen by Dr.  Marjory Sneddon while in the hospital.  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 Last cath 2008 - med Rx. Negative Myoview 5/12 Thankfully he is not having any anginal pains with rest or exertion. He doing quite well and nitrate and Ranexa as well as beta blocker. The fact his volume overloaded, and not having angina and he seemed less concerned about left-sided R. failure and more convinced this is right-sided heart failure situation. His only on a 1 mg aspirin, and no longer on Plavix. As part which is in the surgery GI bleed potential in the past with his alcohol abuse.  Cardiomyopathy, ischemic - EF 45-50% March 2014 His function is estimated relatively stable from a left heart perspective. His on a good regimen as indicated above.  Chronic anticoagulation He is on warfarin, mostly because of his recent DVTs and recurrent. Unfortunately with his pulmonary pressures being with elevated, I am very concerned about the potential of having had small PEs. That did show up on a VQ scan. Microvasive be descending to be on long-term anticoagulation, this is becomes a major issue with anemia. I would shoot for the lower range of INR in the 2-2.5 range. I think this is being followed by his primary's office.  Hypoalbuminemia 8 probably malnourished, this is most likely related to his alcohol use. I talked to him about the importance of important dietary modifications someone is in the hospital. Assistant need to have continues to be induration. This is one reason why think he may end up being a good patient for the heart failure clinic in the long run if he continues to have episodes that require admission.  HTN (hypertension) Definitely controlled, if not to the point of being hypotensive on his current regimen.  Anemia, transfused 1 unit PRBCs 01/22/13 Space of affected part of his renal insufficiency is in the hospital was related to anemia coming on and to check a CBC when he is his BNP in CMP checked next  week.  Alcoholism His alcohol use. He was in the hospital long enough without alcohol to try in terms of the 90s and DTs. He were to have isolated of all withdrawal symptoms. Maybe some of his arrhythmia to the hospital and she couldn't related to the unlikely. I think you in the long on his is to benefit from abstinence. He is not sure how far we will get with him.  Hyperlipidemia Used rate and atorvastatin. Painful his hepatic function has remained stable. When I last asked him this is being followed by his primary physician.    Depending on his lab work looks like. I will like to see him back in about 2 months. If not sooner.  Marykay Lex, [name], M.S. [facility] [address] [place], [place]  [address]  [phone] Pager # [phone]

## 2013-03-19 NOTE — Patient Instructions (Addendum)
Your physician has recommended you make the following change in your medication today take   FUROSEMIDE (Lasix) 80 mg today then 80 mg twice a day for tomorrow 03/20/13              Then go back to 60 mg twice a day.  Your physician recommends that you go to the  Lab for lab work  Next Monday or Tuesday  (7/21 or 7/22)    CMP,PRO-BNP,CBC   You have been referred to see a kidney doctor (Nephrologist)   Your physician wants you to follow-up in  You will receive a reminder letter in the mail two months in advance. If you don't receive a letter, please call our office to schedule the follow-up appointment.

## 2013-03-23 ENCOUNTER — Encounter: Payer: Self-pay | Admitting: Cardiology

## 2013-03-23 DIAGNOSIS — I25810 Atherosclerosis of coronary artery bypass graft(s) without angina pectoris: Secondary | ICD-10-CM | POA: Insufficient documentation

## 2013-03-23 DIAGNOSIS — I208 Other forms of angina pectoris: Secondary | ICD-10-CM | POA: Insufficient documentation

## 2013-03-23 DIAGNOSIS — R609 Edema, unspecified: Secondary | ICD-10-CM | POA: Insufficient documentation

## 2013-03-23 DIAGNOSIS — I2089 Other forms of angina pectoris: Secondary | ICD-10-CM | POA: Insufficient documentation

## 2013-03-23 DIAGNOSIS — I1310 Hypertensive heart and chronic kidney disease without heart failure, with stage 1 through stage 4 chronic kidney disease, or unspecified chronic kidney disease: Secondary | ICD-10-CM

## 2013-03-23 DIAGNOSIS — I5042 Chronic combined systolic (congestive) and diastolic (congestive) heart failure: Secondary | ICD-10-CM | POA: Insufficient documentation

## 2013-03-23 HISTORY — DX: Hypertensive heart and chronic kidney disease without heart failure, with stage 1 through stage 4 chronic kidney disease, or unspecified chronic kidney disease: I13.10

## 2013-03-23 LAB — CBC
HCT: 28.4 % — ABNORMAL LOW (ref 39.0–52.0)
Hemoglobin: 9.5 g/dL — ABNORMAL LOW (ref 13.0–17.0)
MCHC: 33.5 g/dL (ref 30.0–36.0)
MCV: 89 fL (ref 78.0–100.0)
RDW: 14.5 % (ref 11.5–15.5)

## 2013-03-23 LAB — COMPREHENSIVE METABOLIC PANEL
AST: 25 U/L (ref 0–37)
Albumin: 3 g/dL — ABNORMAL LOW (ref 3.5–5.2)
Alkaline Phosphatase: 54 U/L (ref 39–117)
BUN: 17 mg/dL (ref 6–23)
Calcium: 9 mg/dL (ref 8.4–10.5)
Creat: 2.52 mg/dL — ABNORMAL HIGH (ref 0.50–1.35)
Glucose, Bld: 111 mg/dL — ABNORMAL HIGH (ref 70–99)
Potassium: 4.8 mEq/L (ref 3.5–5.3)

## 2013-03-23 NOTE — Assessment & Plan Note (Signed)
8 probably malnourished, this is most likely related to his alcohol use. I talked to him about the importance of important dietary modifications someone is in the hospital. Assistant need to have continues to be induration. This is one reason why think he may end up being a good patient for the heart failure clinic in the long run if he continues to have episodes that require admission.

## 2013-03-23 NOTE — Assessment & Plan Note (Addendum)
He is on warfarin, mostly because of his recent DVTs and recurrent. Unfortunately with his pulmonary pressures being with elevated, I am very concerned about the potential of having had small PEs. That did show up on a VQ scan. Microvasive be descending to be on long-term anticoagulation, this is becomes a major issue with anemia. I would shoot for the lower range of INR in the 2-2.5 range. I think this is being followed by his primary's office.

## 2013-03-23 NOTE — Assessment & Plan Note (Signed)
Definitely controlled, if not to the point of being hypotensive on his current regimen.

## 2013-03-23 NOTE — Assessment & Plan Note (Addendum)
Thankfully he is not having any anginal pains with rest or exertion. He doing quite well and nitrate and Ranexa as well as beta blocker. The fact his volume overloaded, and not having angina and he seemed less concerned about left-sided R. failure and more convinced this is right-sided heart failure situation. His only on a 1 mg aspirin, and no longer on Plavix. As part which is in the surgery GI bleed potential in the past with his alcohol abuse.

## 2013-03-23 NOTE — Assessment & Plan Note (Addendum)
We willDifficult scenario who is to have the right PDA. Clearly is volume overloaded and needs diuresis. His renal function improved after his lung status stable as last time. Reason to believe that was the removed the congestive nephropathy, the renal function will improve. Initially his renal function was exacerbated possibly by overdiuresis and hypotension, now all affected still elevated despite clearly increased volume, this is more likely a cardiorenal effect.   Closely. Renal function and electrolytes and labs next week. S. she is proven to have intermittent bouts of renal insufficiency, I think it he would probably benefit from following back up with nephrology. He was seen briefly in the hospital do to his anemia. I think the plan was for him to be seen as an outpatient, so called ahead and refer him. He was seen by Dr. Marjory Sneddon while in the hospital.

## 2013-03-23 NOTE — Assessment & Plan Note (Signed)
He separated regimen currently based on the fact his blood pressures as low as it is. He is on full dose carvedilol with good rate control of his heart rate. He is on hydralazine plus nitrate. And really is not having any left-sided heart failure type symptoms. Mostly all right-sided. 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 Hopefully this will improve as he is diuresed.

## 2013-03-23 NOTE — Assessment & Plan Note (Signed)
Used rate and atorvastatin. Painful his hepatic function has remained stable. When I last asked him this is being followed by his primary physician.

## 2013-03-23 NOTE — Assessment & Plan Note (Signed)
Space of affected part of his renal insufficiency is in the hospital was related to anemia coming on and to check a CBC when he is his BNP in CMP checked next week.

## 2013-03-23 NOTE — Assessment & Plan Note (Signed)
Unfortunately with a catch 22 position here. His renal function got worse, so his diuretic was held, now his edema is worse. I think that the weight he was down to was probably low for him at his dry weight. He probably need acute for about 202 pounds currently. His edema is significantly worse than reported by Ms. Annie Paras, NP.  To gradually restart his Lasix 80 mg twice a day for the next couple days of: 3 doses starting tonight, then go back to 60 mg twice a day. Will increase as has his BNP as well CMP next week just establish renal function is doing. He has gotten better and not worse. We'll also do a CBC status since his had a history of anemia.

## 2013-03-23 NOTE — Assessment & Plan Note (Signed)
His function is estimated relatively stable from a left heart perspective. His on a good regimen as indicated above.

## 2013-03-23 NOTE — Assessment & Plan Note (Signed)
His alcohol use. He was in the hospital long enough without alcohol to try in terms of the 90s and DTs. He were to have isolated of all withdrawal symptoms. Maybe some of his arrhythmia to the hospital and she couldn't related to the unlikely. I think you in the long on his is to benefit from abstinence. He is not sure how far we will get with him.

## 2013-03-26 ENCOUNTER — Telehealth: Payer: Self-pay | Admitting: Cardiology

## 2013-03-26 NOTE — Telephone Encounter (Signed)
called both numbers to r/s appointment but no answer

## 2013-03-30 ENCOUNTER — Telehealth: Payer: Self-pay | Admitting: Cardiology

## 2013-04-09 ENCOUNTER — Telehealth: Payer: Self-pay | Admitting: Cardiology

## 2013-04-09 NOTE — Telephone Encounter (Signed)
Good.  He has had a recent echo.  But as important as EF is his diastolic function.  Marykay Lex, MD

## 2013-04-09 NOTE — Telephone Encounter (Signed)
Message forwarded to Dr. Donneta Romberg, RN.  Medical records also notified to process request for echo and results.

## 2013-04-09 NOTE — Telephone Encounter (Signed)
Calling to let you know that Devin Williams is now in their Heart Failure Monitoring Program and they will be sending  Dr. Herbie Baltimore a welcome package and she would also like to know when was his last echo and the ejection fraction from the echo .[name] Please Call   Thanks

## 2013-04-09 NOTE — Telephone Encounter (Signed)
Left message on Erica's voicemail   LAST ECHO 11/18/2012 EF 45-50% Any question may call back

## 2013-04-21 ENCOUNTER — Ambulatory Visit (INDEPENDENT_AMBULATORY_CARE_PROVIDER_SITE_OTHER): Payer: Medicare Other | Admitting: Cardiology

## 2013-04-21 ENCOUNTER — Encounter: Payer: Self-pay | Admitting: Cardiology

## 2013-04-21 VITALS — BP 106/52 | HR 72 | Ht 68.0 in | Wt 199.4 lb

## 2013-04-21 DIAGNOSIS — R609 Edema, unspecified: Secondary | ICD-10-CM

## 2013-04-21 DIAGNOSIS — I509 Heart failure, unspecified: Secondary | ICD-10-CM

## 2013-04-21 DIAGNOSIS — I2510 Atherosclerotic heart disease of native coronary artery without angina pectoris: Secondary | ICD-10-CM

## 2013-04-21 DIAGNOSIS — I255 Ischemic cardiomyopathy: Secondary | ICD-10-CM

## 2013-04-21 DIAGNOSIS — I82409 Acute embolism and thrombosis of unspecified deep veins of unspecified lower extremity: Secondary | ICD-10-CM

## 2013-04-21 DIAGNOSIS — Z79899 Other long term (current) drug therapy: Secondary | ICD-10-CM

## 2013-04-21 DIAGNOSIS — E669 Obesity, unspecified: Secondary | ICD-10-CM

## 2013-04-21 DIAGNOSIS — I82403 Acute embolism and thrombosis of unspecified deep veins of lower extremity, bilateral: Secondary | ICD-10-CM

## 2013-04-21 DIAGNOSIS — R600 Localized edema: Secondary | ICD-10-CM

## 2013-04-21 DIAGNOSIS — I208 Other forms of angina pectoris: Secondary | ICD-10-CM

## 2013-04-21 DIAGNOSIS — I10 Essential (primary) hypertension: Secondary | ICD-10-CM

## 2013-04-21 DIAGNOSIS — I2589 Other forms of chronic ischemic heart disease: Secondary | ICD-10-CM

## 2013-04-21 DIAGNOSIS — E785 Hyperlipidemia, unspecified: Secondary | ICD-10-CM

## 2013-04-21 DIAGNOSIS — E8809 Other disorders of plasma-protein metabolism, not elsewhere classified: Secondary | ICD-10-CM

## 2013-04-21 DIAGNOSIS — N19 Unspecified kidney failure: Secondary | ICD-10-CM

## 2013-04-21 DIAGNOSIS — I5042 Chronic combined systolic (congestive) and diastolic (congestive) heart failure: Secondary | ICD-10-CM

## 2013-04-21 DIAGNOSIS — I1310 Hypertensive heart and chronic kidney disease without heart failure, with stage 1 through stage 4 chronic kidney disease, or unspecified chronic kidney disease: Secondary | ICD-10-CM

## 2013-04-21 DIAGNOSIS — I209 Angina pectoris, unspecified: Secondary | ICD-10-CM

## 2013-04-21 LAB — CBC
HCT: 30.6 % — ABNORMAL LOW (ref 39.0–52.0)
Hemoglobin: 9.8 g/dL — ABNORMAL LOW (ref 13.0–17.0)
MCH: 29 pg (ref 26.0–34.0)
MCHC: 32 g/dL (ref 30.0–36.0)
RBC: 3.38 MIL/uL — ABNORMAL LOW (ref 4.22–5.81)

## 2013-04-21 LAB — COMPREHENSIVE METABOLIC PANEL
AST: 21 U/L (ref 0–37)
Alkaline Phosphatase: 66 U/L (ref 39–117)
BUN: 16 mg/dL (ref 6–23)
Creat: 2.48 mg/dL — ABNORMAL HIGH (ref 0.50–1.35)
Total Bilirubin: 0.5 mg/dL (ref 0.3–1.2)

## 2013-04-21 NOTE — Patient Instructions (Addendum)
Your physician recommends that you schedule a follow-up appointment in 3 months    Your physician recommends that you get  lab work done today CMP,CBC

## 2013-04-22 ENCOUNTER — Telehealth: Payer: Self-pay | Admitting: *Deleted

## 2013-04-22 NOTE — Telephone Encounter (Signed)
Left message on answer machine to call back about labs result.

## 2013-04-22 NOTE — Telephone Encounter (Signed)
Message copied by Tobin Chad on Wed Apr 22, 2013  2:40 PM ------      Message from: [facility], DAVID      Created: Tue Apr 21, 2013 10:35 PM       Stable labs -- kidney function has stablized.  Potassium is a bit up (if taking supplement, stop).      Continue current Lasix dose.            Albumin continues to slowly improve.  Hemoglobin level steady, to improving.            Looks OK.            Marykay Lex, MD       ------

## 2013-04-23 ENCOUNTER — Encounter: Payer: Self-pay | Admitting: *Deleted

## 2013-04-23 NOTE — Telephone Encounter (Signed)
Mr. Geraghty returned your call from yesterday.  [phone]

## 2013-04-29 NOTE — Telephone Encounter (Signed)
Letter sent to patient on 04/23/2013

## 2013-05-01 ENCOUNTER — Encounter: Payer: Self-pay | Admitting: Cardiology

## 2013-05-01 DIAGNOSIS — E669 Obesity, unspecified: Secondary | ICD-10-CM | POA: Insufficient documentation

## 2013-05-01 NOTE — Assessment & Plan Note (Signed)
Notably improved edema. Back on stable diuretic regimen. Labs stable.  Continue current regimen with sliding scale Lasix as directed.

## 2013-05-01 NOTE — Assessment & Plan Note (Signed)
If anything, is a little borderline hypotensive today. On to her regimen. Reluctant to make any changes now as long as he is asymptomatic.

## 2013-05-01 NOTE — Assessment & Plan Note (Signed)
Thankfully, his creatinine seems to stabilized out at 2.5. He is due to see Dr. Kathrene Bongo from nephrology. We'll go and get a CBC and chemistry panel today so as to be present for his appointment tomorrow.

## 2013-05-01 NOTE — Assessment & Plan Note (Addendum)
His acute exacerbation seizure now resolved. Again not really noting any significant left-sided failure. Is mostly right-sided. On stable dose of diuretic now. Plan: Continue current regimen of focal scarring twice a day, hydralazine plus nitrate (a new ACE inhibitor/ARB with renal insufficiency) On Lasix 60 mg twice a day with instructions to increase dose to 80 twice a day but one could not weight weight gain until he gets back to baseline. Anymore than 5 pounds increase should warrant a call to the office for guidance.

## 2013-05-01 NOTE — Assessment & Plan Note (Signed)
No active anginal symptoms on his current regimen of beta blocker, nitrate and adnexa. He is on aspirin and Coumadin. Plaque is no longer on board due to being on warfarin. Would consider restarting Plavix once off warfarin for DVT PE.

## 2013-05-01 NOTE — Assessment & Plan Note (Signed)
Visit difficult situation with him being protein malnourished. I stressed the importance of focusing on healthy protein and fruits and vegetables in his diet. Avoiding high carbohydrates and fatty foods.

## 2013-05-01 NOTE — Assessment & Plan Note (Signed)
On stable regimen as noted. If he continues to have symptoms of heart failure, would want to consider right and left heart catheterization, however with his this all off. I would be feasible but left heart cath/angiography would not be advisable unless symptoms warrant

## 2013-05-01 NOTE — Assessment & Plan Note (Signed)
Slowly improving. I think is his getting back into his normal routine and in feeling better, he'll continue to better.

## 2013-05-01 NOTE — Progress Notes (Signed)
Patient ID: Devin Williams, male   DOB: 1946-08-29, 67 y.o.   MRN: [id number] PCP: [name], MD  Clinic Note: Chief Complaint  Patient presents with  . Follow-up    no chest pain,no sob ,edema   HPI: DELVON CHIPPS is a 67 y.o. male with a PMH below who presents today for one-month followup for his chronic combined systolic and diastolic heart failure, coronary disease, et Karie Soda. I last saw him in July of 2014. 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  The concave matters, he has a history of chronic alcohol abuse, as well as. Aggressive and difficult control psoriasis. He has chronic stable angina that is well-controlled on Ranexa. Of late his most notable artery symptoms have been right side as opposed to left-sided. He was referred to see Dr. Kathrene Bongo from nephrology, and is scheduled to see her tomorrow.  Interval History: This is last visit, we have stabilize out his diuretic regimen. For the last week or so he has been feeling much better. His weight had gone up to 207 pounds my last saw him. We got him back on a diuretic after a short pulse course and is now down to 199 pounds. His edema is notably improved as has his dyspnea. He currently denies any dyspnea on exertion or PND/orthopnea. His edema is almost minimal. He does a little to the feet. At the tight lotion the edema causing skin tears has all but resolved. He also had recent labs that demonstrated stable renal function with a creatinine is 2.52.  His hemoglobin levels also been stable.  Now back on his regimen, he denies any symptoms of angina with rest or exertion unless he really exerts himself strenuously. He denies any significant palpitations. No recent lightheadedness, dizziness or wooziness. No syncope near  syncope. No TIA or amaurosis fugax symptoms. No melena, hematochezia or hematuria. No claudication symptoms. He does have pain in his feet is probably a combination of neuropathic symptoms as well as do to his skin condition. He has been following up with Dr. Terri Piedra for his dermatologic condition. Unfortunately he was not able to afford the previous medication (Soriatane) that really helped out. He is now on mephyton which is not helping as much. The cream seems to be better than that.  Past Medical History  Diagnosis Date  . Hypertension   . DVT (deep venous thrombosis) 11/2012    bilateral  . Psoriasis   . Chronic kidney disease     CKD 3  . CAD in native artery 1999    CABG X 5 '99, Myoview low risk 5/12  . Hyperlipidemia   . Alcohol abuse, daily use   . Diastolic dysfunction March 2014    EF 45% by echo  . GERD (gastroesophageal reflux disease)   . Optic nerve ischemia   . Problems with hearing   . Vision problems   . Carotid bruit 03/20/2007    Carotid doppler - R & L bulbs and proximal ICA -  irregular nonhemodynamically significant plaque of 0-49% diameter reduction  . Edema   . Hypoalbuminemia   . Sinus tachycardia   . Hx-TIA (transient ischemic attack)   . Barrett's esophagus 01/29/2013  . Stable angina     Chronic  . Cardiorenal syndrome with renal failure  03/23/2013  . Cardiomyopathy, ischemic - EF 45-50% March 2014 01/19/2013  . Chronic combined systolic and diastolic CHF, NYHA class 2    Prior Cardiac Evaluation and Past Surgical History: Past Surgical History  Procedure Laterality Date  . Doppler echocardiography      F of 45% to 50%, which was down from before. There was moderate aortic   . Nm myoview ltd  01/2011    inferior attenuation, low risk and no ischemia, either due to infarction or scar but no ischemia noted.   . 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 Cardiac catheterization  02/14/2007     Native Vessels: RCA - 100%, LAD 100% at D1, Cx 100% in AV (AV Groov branch - collaterals fill distal RPL).  Grafts: LIMA-LAD & SVG-OM1-OM2 patent; SVG-RCA & SVG-Ramus occluded  . Colonoscopy N/A 01/24/2013    Procedure: COLONOSCOPY;  Surgeon: Hart Carwin, MD;  Location: [facility] ENDOSCOPY;  Service: Endoscopy;  Laterality: N/A;  . Esophagogastroduodenoscopy N/A 01/24/2013    Procedure: ESOPHAGOGASTRODUODENOSCOPY (EGD);  Surgeon: Hart Carwin, MD;  Location: [facility] ENDOSCOPY;  Service: Endoscopy;  Laterality: N/A;  . Knee surgery    . Appendectomy      Allergies  Allergen Reactions  . Penicillins     Current Outpatient Prescriptions  Medication Sig Dispense Refill  . aspirin EC 81 MG tablet Take 81 mg by mouth daily.      [name] atorvastatin (LIPITOR) 20 MG tablet Take 20 mg by mouth at bedtime.      . carvedilol (COREG) 25 MG tablet Take 1 tablet (25 mg total) by mouth 2 (two) times daily with a meal.  62 tablet  0  . dorzolamide-timolol (COSOPT) 22.3-6.8 MG/ML ophthalmic solution Place 1 drop into both eyes at bedtime.      . fenofibrate 160 MG tablet Take 160 mg by mouth daily.      . folic acid (FOLVITE) 1 MG tablet Take 1 tablet (1 mg total) by mouth daily.  30 tablet  5  . furosemide (LASIX) 20 MG tablet Take 3 tablets (60 mg total) by mouth 2 (two) times daily.  120 tablet  5  . hydrALAZINE (APRESOLINE) 25 MG tablet Take 1.5 tablets (37.5 mg total) by mouth 2 (two) times daily.  90 tablet  5  . hydrocortisone cream 0.5 % Apply 1 application topically 2 (two) times daily.      . hydrOXYzine (ATARAX/VISTARIL) 10 MG tablet Take 10 mg by mouth 3 (three) times daily as needed for itching.      . isosorbide mononitrate (IMDUR) 30 MG 24 hr tablet Take 1-2 tablets (30-60 mg total) by mouth daily.  90 tablet  5  . latanoprost (XALATAN) 0.005 % ophthalmic solution 1 drop at bedtime.      . MEPHYTON 5 MG tablet       . Multiple Vitamin (MULTIVITAMIN WITH MINERALS) TABS Take 1 tablet by mouth daily.        . nitroGLYCERIN (NITROSTAT) 0.4 MG SL tablet Place 1 tablet (0.4 mg total) under the tongue every 5 (five) minutes as needed for chest pain (hold for SBP < 110).  20 tablet  0  . oxyCODONE (OXY IR/ROXICODONE) 5 MG immediate release tablet Take 1 tablet (5 mg total) by mouth every 4 (four) hours as needed.  15 tablet  0  . potassium chloride (K-DUR) 10 MEQ tablet Take 10 mEq by mouth daily.      [name]  RANEXA 500 MG 12 hr tablet Take 500 mg by mouth daily.      [name] thiamine 100 MG tablet Take 1 tablet (100 mg total) by mouth daily.  30 tablet  5  . Triamcinolone Acetonide (TRIAMCINOLONE 0.1 % CREAM : EUCERIN) CREA Apply 1 application topically 3 (three) times daily.  1 each  3  . warfarin (COUMADIN) 5 MG tablet Take 5 mg by mouth daily.      . cephALEXin (KEFLEX) 500 MG capsule        No current facility-administered medications for this visit.    History   Social History Narrative   Lives at home 2 story house, normally does not use an assist device, but borrowed his wife's cane recently.  Previously drove.     Former Heavy EtOH drinker - now says he "hardly drinks".   Runs a [employer].   ROS: A comprehensive Review of Systems - Negative except Prevacid as noted above. He says the edema is stable. No PND orthopnea. 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 His skin continues to be much clearer than prior to his hospitalization. HEENT:normocephalic, sclera clear, mucus membranes moist  Neck:supple, no JVD, no bruits  Heart:S1S2 RRR without murmur, gallup, rub or click  Lungs:clear without rales, rhonchi, or wheezes  ZOX:WRUE, non tender, + BS, do not palpate liver spleen or masses  Ext: 3+ lower ext  edema to hips, 2+ pedal pulses, 2+ radial pulses  Neuro:alert and oriented, MAE, follows commands, + facial symmetry  AVW:[id number] today: No  Recent Labs: July 21:  Chem panel: Sodium 135, potassium 5.0, chloride 101, bicarbonate 29, BUN 16, creatinine 2.48, calcium 8.7, glucose 79. LFTs normal but albumin is slightly higher at 3.1.  Pro BNP improved to 578.  CBC showed stable hemoglobin of 9.5 otherwise stable.  ASSESSMENT / PLAN: Bilateral lower extremity edema - secondary to combined systolic & diastolic HF (with possible Right Sided HF component) Notably improved edema. Back on stable diuretic regimen. Labs stable.  Continue current regimen with sliding scale Lasix as directed.  Chronic combined systolic and diastolic CHF, NYHA class 2 His acute exacerbation seizure now resolved. Again not really noting any significant left-sided failure. Is mostly right-sided. On stable dose of diuretic now. Plan: Continue current regimen of focal scarring twice a day, hydralazine plus nitrate (a new ACE inhibitor/ARB with renal insufficiency) On Lasix 60 mg twice a day with instructions to increase dose to 80 twice a day but one could not weight weight gain until he gets back to baseline. Anymore than 5 pounds increase should warrant a call to the office for guidance.  Cardiorenal syndrome with renal failure Thankfully, his creatinine seems to stabilized out at 2.5. He is due to see Dr. Kathrene Bongo from nephrology. We'll go and get a CBC and chemistry panel today so as to be present for his appointment tomorrow.  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 Last cath 2008 - med Rx. Negative Myoview 5/12 No active anginal symptoms on his current regimen of beta blocker, nitrate and adnexa. He is on aspirin and Coumadin. Plaque is no longer on board due to being on warfarin. Would consider restarting Plavix once off  warfarin for DVT PE.  Cardiomyopathy, ischemic - EF 45-50% March 2014 On stable regimen as noted. If he  continues to have symptoms of heart failure, would want to consider right and left heart catheterization, however with his this all off. I would be feasible but left heart cath/angiography would not be advisable unless symptoms warrant  DVT (deep venous thrombosis) March 2014 Continues to be on warfarin, monitored by his primary physician.  As his DVTs or recurrent, and not sure if the best plan would be to just continue long-term anticoagulation with warfarin. I do worry with his alcohol intake, would he be at increased risk of bleeding. I counseled him significantly on the importance of Foley quitting versus simply cutting back to a reasonable amount of alcohol.  HTN (hypertension) If anything, is a little borderline hypotensive today. On to her regimen. Reluctant to make any changes now as long as he is asymptomatic.  Hypoalbuminemia Slowly improving. I think is his getting back into his normal routine and in feeling better, he'll continue to better.  Hyperlipidemia He is on Lipitor, tolerating it well. His hepatic function has remained stable. This will now be been followed by termination myself nephrology and his primary physician. I will defer to his primary physician if he will be seen bear more frequently.  Stable angina On excellent regimen. No active angina symptoms now.  Obesity (BMI 30-39.9) Visit difficult situation with him being protein malnourished. I stressed the importance of focusing on healthy protein and fruits and vegetables in his diet. Avoiding high carbohydrates and fatty foods.    Orders Placed This Encounter  Procedures  . Comprehensive metabolic panel  . CBC    Followup: 3 months  DAVID W. Herbie Baltimore, [name], M.S. [facility] [address] [place], [place]  [address]  [phone] Pager # [phone]

## 2013-05-01 NOTE — Assessment & Plan Note (Signed)
On excellent regimen. No active angina symptoms now.

## 2013-05-01 NOTE — Assessment & Plan Note (Signed)
Continues to be on warfarin, monitored by his primary physician.  As his DVTs or recurrent, and not sure if the best plan would be to just continue long-term anticoagulation with warfarin. I do worry with his alcohol intake, would he be at increased risk of bleeding. I counseled him significantly on the importance of Foley quitting versus simply cutting back to a reasonable amount of alcohol.

## 2013-05-01 NOTE — Assessment & Plan Note (Signed)
He is on Lipitor, tolerating it well. His hepatic function has remained stable. This will now be been followed by termination myself nephrology and his primary physician. I will defer to his primary physician if he will be seen bear more frequently.

## 2013-06-15 ENCOUNTER — Telehealth: Payer: Self-pay | Admitting: Cardiology

## 2013-06-15 MED ORDER — NITROGLYCERIN 0.4 MG SL SUBL: 0.4000 mg | SUBLINGUAL_TABLET | SUBLINGUAL | Status: DC | PRN

## 2013-06-15 NOTE — Telephone Encounter (Signed)
Pharmacy states that Devin Williams is at the pharmacy having chest pain and wants his Nitro Stat refilled.  He was advised to go to the ER but he refused.

## 2013-06-15 NOTE — Telephone Encounter (Signed)
Notified pharmacy, e-sent prescription

## 2013-07-27 ENCOUNTER — Ambulatory Visit (INDEPENDENT_AMBULATORY_CARE_PROVIDER_SITE_OTHER): Payer: Medicare Other | Admitting: Cardiology

## 2013-07-27 ENCOUNTER — Encounter: Payer: Self-pay | Admitting: Cardiology

## 2013-07-27 VITALS — BP 102/60 | HR 91 | Ht 69.0 in | Wt 204.5 lb

## 2013-07-27 DIAGNOSIS — I82409 Acute embolism and thrombosis of unspecified deep veins of unspecified lower extremity: Secondary | ICD-10-CM

## 2013-07-27 DIAGNOSIS — I2510 Atherosclerotic heart disease of native coronary artery without angina pectoris: Secondary | ICD-10-CM

## 2013-07-27 DIAGNOSIS — I10 Essential (primary) hypertension: Secondary | ICD-10-CM

## 2013-07-27 DIAGNOSIS — I5043 Acute on chronic combined systolic (congestive) and diastolic (congestive) heart failure: Secondary | ICD-10-CM

## 2013-07-27 DIAGNOSIS — R609 Edema, unspecified: Secondary | ICD-10-CM

## 2013-07-27 DIAGNOSIS — E785 Hyperlipidemia, unspecified: Secondary | ICD-10-CM

## 2013-07-27 DIAGNOSIS — I209 Angina pectoris, unspecified: Secondary | ICD-10-CM

## 2013-07-27 DIAGNOSIS — F1020 Alcohol dependence, uncomplicated: Secondary | ICD-10-CM

## 2013-07-27 DIAGNOSIS — R600 Localized edema: Secondary | ICD-10-CM

## 2013-07-27 DIAGNOSIS — N189 Chronic kidney disease, unspecified: Secondary | ICD-10-CM

## 2013-07-27 DIAGNOSIS — I208 Other forms of angina pectoris: Secondary | ICD-10-CM

## 2013-07-27 DIAGNOSIS — I82403 Acute embolism and thrombosis of unspecified deep veins of lower extremity, bilateral: Secondary | ICD-10-CM

## 2013-07-27 DIAGNOSIS — N179 Acute kidney failure, unspecified: Secondary | ICD-10-CM

## 2013-07-27 MED ORDER — FUROSEMIDE 20 MG PO TABS: 60.0000 mg | ORAL_TABLET | Freq: Two times a day (BID) | ORAL | Status: AC

## 2013-07-27 MED ORDER — RANOLAZINE ER 500 MG PO TB12: 500.0000 mg | ORAL_TABLET | Freq: Every day | ORAL | Status: DC

## 2013-07-27 NOTE — Assessment & Plan Note (Signed)
Continue Lipitor. PCP to check cholesterol levels on December 4th. Have asked patient to have these faxed to [place].

## 2013-07-27 NOTE — Assessment & Plan Note (Signed)
Encouraged patient to continue to follow up with Dr. Hyman Hopes. Patient is to see primary care doctor December 4th and will have lipids and chemistry labwork faxed to [place].

## 2013-07-27 NOTE — Assessment & Plan Note (Signed)
Well controlled. Continue current regimen. As previously, borderline hypotensive but patient asymptomatic.

## 2013-07-27 NOTE — Assessment & Plan Note (Signed)
On warfarin. Given DVTs were recurrent, likely lifelong therapy.

## 2013-07-27 NOTE — Assessment & Plan Note (Signed)
Last drink over a month ago. Congratulated patient and encouraged continued cessation.

## 2013-07-27 NOTE — Progress Notes (Signed)
Patient ID: Devin Williams, male   DOB: [birth year], 67 y.o.   MRN: [id number] PCP: [name], MD  Clinic Note: Chief Complaint  Patient presents with  . Follow-up    3 months:  No chest pain, SOB, dizziness.  Occas. edema right greater than left.   HPI: Devin Williams is a 67 y.o. male with a PMH below who presents today for threemonth followup for his coronary artery disease and chronic combined systolic and diastolic heart failure.  Prior History Patient with DVTs bilateral and PE starting in the fall of 2013. He has had several hospitalizations since that time with recurrent DVTs and heart failure exacerbations as well as acute on chronic renal insufficiency cardiorenal syndrome (now followed by Dr. Hyman Hopes) of [facility].  Patient has a history of chronic alcohol abuse (last drink 1-2 months ago he states), aggressive and difficult to control psoriasis, and chronic stable angina that is well-controlled on Ranexa. Patient was last seen in August after a CHF exacerbation within the previous month. His diuretic regimen was stabilized at last visit and he is taking 60mg  lasix twice a day after requiring a burst of increased lasix for his exacerbation. His weight had come from 207 to 199 during outpatient treatment for that CHF exacerbation. Dyspnea significantly improved with this treatment.   Interval History: Patient states he has had no chest pain or shortness of breath over the last month. He can walk up a flight of stairs without difficulty though multiple flights he avoids.   Prior to that, he ran out of ranexa and his lasix for at least a week for which he states he had some chest pain and increased swelling in his legs. That corresponds to a phone note in October where patient was requesting nitroglycerin and was told to go to ED which he refused. His weight is up 5 lbs from last visit (but wearing heavy clothing today). He currently denies any dyspnea on exertion or  PND/orthopnea. Denies lightheadedness or dizziness including with standing. Denies syncope.  He states his edema has been much better since getting back on his lasix but not improved to levels of last office visit. Labs in august showed stable creatinine near 2.5 and stable hgb levels. He thinks he saw Dr. Hyman Hopes last week and that he was told his kidney function was better. He continues to follow with Dr. Terri Piedra for his dermatologic condition. He is on mephyton (due to cost) which does not work as well as the previous medication (Soriatane) that really helped out. His last stress test was in May of 2012.   Past Medical History  Diagnosis Date  . Hypertension   . DVT (deep venous thrombosis) 11/2012    bilateral  . Psoriasis   . Chronic kidney disease     CKD 3  . CAD in native artery 1999    CABG X 5 '99, Myoview low risk 5/12  . Hyperlipidemia   . Alcohol abuse, daily use   . Diastolic dysfunction March 2014    EF 45% by echo  . GERD (gastroesophageal reflux disease)   . Optic nerve ischemia   . Problems with hearing   . Vision problems   . Carotid bruit 03/20/2007    Carotid doppler - R & L bulbs and proximal ICA -  irregular nonhemodynamically significant plaque of 0-49% diameter reduction  . Edema   . Hypoalbuminemia   . Sinus tachycardia   . Hx-TIA (transient ischemic attack)   .  Barrett's esophagus 01/29/2013  . Stable angina     Chronic  . Cardiorenal syndrome with renal failure 03/23/2013  . Cardiomyopathy, ischemic - EF 45-50% March 2014 01/19/2013  . Chronic combined systolic and diastolic CHF, NYHA class 2    Prior Cardiac Evaluation and Past Surgical History: Past Surgical History  Procedure Laterality Date  . Doppler echocardiography  March 2012    EF of 45% to 50%, which was down from before. There was moderate aortic   . Nm myoview ltd  01/2011    inferior attenuation, low risk and no ischemia, either due to infarction or scar but no ischemia noted.   . Coronary artery  bypass graft  09/1997    LIMA-LAD, SVG-OM1-OM2 sequential, SVG-ramus intermedius, SVG-RCA  . Cardiac catheterization  02/14/2007    Native Vessels: RCA - 100%, LAD 100% at D1, Cx 100% in AV (AV Groov branch - collaterals fill distal RPL).  Grafts: LIMA-LAD & SVG-OM1-OM2 patent; SVG-RCA & SVG-Ramus occluded  . Colonoscopy N/A 01/24/2013    Procedure: COLONOSCOPY;  Surgeon: Hart Carwin, MD;  Location[facility] ENDOSCOPY;  Service: Endoscopy;  Laterality: N/A;  . Esophagogastroduodenoscopy N/A 01/24/2013    Procedure: ESOPHAGOGASTRODUODENOSCOPY (EGD);  Surgeon: Hart Carwin, MD;  Location: [facility] ENDOSCOPY;  Service: Endoscopy;  Laterality: N/A;  . Knee surgery    . Appendectomy      Allergies  Allergen Reactions  . Penicillins     Current Outpatient Prescriptions  Medication Sig Dispense Refill  . aspirin EC 81 MG tablet Take 81 mg by mouth daily.      [name] atorvastatin (LIPITOR) 20 MG tablet Take 20 mg by mouth at bedtime.      . carvedilol (COREG) 25 MG tablet Take 1 tablet (25 mg total) by mouth 2 (two) times daily with a meal.  62 tablet  0  . dorzolamide-timolol (COSOPT) 22.3-6.8 MG/ML ophthalmic solution Place 1 drop into both eyes at bedtime.      . fenofibrate 160 MG tablet Take 160 mg by mouth daily.      . folic acid (FOLVITE) 1 MG tablet Take 1 tablet (1 mg total) by mouth daily.  30 tablet  5  . furosemide (LASIX) 20 MG tablet Take 3-4 tablets (60-80 mg total) by mouth 2 (two) times daily.  240 tablet  11  . hydrALAZINE (APRESOLINE) 25 MG tablet Take 1.5 tablets (37.5 mg total) by mouth 2 (two) times daily.  90 tablet  5  . hydrocortisone cream 0.5 % Apply 1 application topically 2 (two) times daily.      . hydrOXYzine (ATARAX/VISTARIL) 10 MG tablet Take 10 mg by mouth 3 (three) times daily as needed for itching.      . isosorbide mononitrate (IMDUR) 30 MG 24 hr tablet Take 1-2 tablets (30-60 mg total) by mouth daily.  90 tablet  5  . latanoprost (XALATAN) 0.005 % ophthalmic solution 1 drop  at bedtime.      . MEPHYTON 5 MG tablet       . Multiple Vitamin (MULTIVITAMIN WITH MINERALS) TABS Take 1 tablet by mouth daily.      . nitroGLYCERIN (NITROSTAT) 0.4 MG SL tablet Place 1 tablet (0.4 mg total) under the tongue every 5 (five) minutes as needed for chest pain (hold for SBP < 110).  25 tablet  3  . oxyCODONE (OXY IR/ROXICODONE) 5 MG immediate release tablet Take 1 tablet (5 mg total) by mouth every 4 (four) hours as needed.  15 tablet  0  .  potassium chloride (K-DUR) 10 MEQ tablet Take 10 mEq by mouth daily.      . ranolazine (RANEXA) 500 MG 12 hr tablet Take 1 tablet (500 mg total) by mouth daily.  60 tablet  11  . thiamine 100 MG tablet Take 1 tablet (100 mg total) by mouth daily.  30 tablet  5  . Triamcinolone Acetonide (TRIAMCINOLONE 0.1 % CREAM : EUCERIN) CREA Apply 1 application topically 3 (three) times daily.  1 each  3  . warfarin (COUMADIN) 5 MG tablet Take 5 mg by mouth daily.       No current facility-administered medications for this visit.    History   Social History Narrative   Lives at home 2 story house, normally does not use an assist device, but borrowed his wife's cane recently.  Previously drove.     Former Heavy EtOH drinker - now says he "hardly drinks".   Runs a [employer].   ROS: A comprehensive Review of Systems - Negative except Prevacid as noted above. He says the edema is stable. No PND orthopnea. 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 His skin continues to be much clearer than prior to his hospitalization. HEENT:normocephalic, sclera clear, mucus membranes moist  Neck:supple, no JVD, no bruits  Heart:S1S2 RRR without murmur, gallup, rub or click    Lungs:clear without rales, rhonchi, or wheezes  FAO:ZHYQ, non tender, + BS, do not palpate liver spleen or masses  Ext: 2+ lower ext edema to knees, 2+ pedal pulses, 2+ radial pulses  Neuro:alert and oriented, MAE, follows commands, + facial symmetry  MVH:QIONGEXBM today: NSR with rate of 91. Nonspecific st-t wave changes.   Recent Labs:  Most recently from August. Will repeat today  ASSESSMENT / PLAN: Acute on chronic combined systolic and diastolic heart failure Weight up 5 lbs from last visit. Will increase lasix to 80mg  BID x 1 week then have patient return to 60mg  BID. Increased weight likely due to patient running out for at least a week within the last 2 months.   Alcoholism Last drink over a month ago. Congratulated patient and encouraged continued cessation.   Acute on chronic renal failure, intermittent; with CardioRenal Syndrome Encouraged patient to continue to follow up with Dr. Hyman Hopes. Patient is to see primary care doctor December 4th and will have lipids and chemistry labwork faxed to [place].   Bilateral lower extremity edema - secondary to combined systolic & diastolic HF (with possible Right Sided HF component) 1+ to knees. Will increase lasix x 1 week to 80mg  BID then return to 60mg  BID.   CAD in native artery No chest pain or shortness of breath when on lasix and ranexa. Suspect when patient ran out of both he had increased demands from CHF and symptoms worsened. As long as patient asymptomatic will not pursue stress test. Given creatinine up to 2.5 so catheterization would not be without risk.   DVT (deep venous thrombosis) March 2014 On warfarin. Given DVTs were recurrent, likely lifelong therapy.   HTN (hypertension) Well controlled. Continue current regimen. As previously, borderline hypotensive but patient asymptomatic.   Hyperlipidemia Continue Lipitor. PCP to check cholesterol levels on December 4th. Have asked  patient to have these faxed to [place].   Stable  angina As always is on the nitrate and Ranexa he sees a doing well. I think he probably also has symptoms of his volume will get his. We discussed sliding scale Lasix therapy and will continue the nitrate plus Ranexa and beta blocker.    Orders Placed This Encounter  Procedures  . EKG 12-Lead   Followup: 6 months  Dr. Herbie Baltimore has seen and evaluated the patient. We have discussed the history, exam, assessment, and plan as noted above. He agrees with management.   Aldine Contes. Marti Sleigh, MD, PGY-3 [facility] 07/27/2013   I have seen and evaluated the patient this PM along with Dr. Durene Cal.   I agree with his findings, examination as well as impression recommendations.   Mr. Krohn seemed to do quite well today. He did have an episode or. When he was noticing more exertional angina and shortness of breath as well as edema. He apparently run out of his Lasix and Ranexa. Now he is back on those he is doing much better. He has lost back some of the weight that he gained. He probably still about 4-5 pounds over what he should be with the amantadine that he has. We would need to check chemistry panels on him, he said that he recently had his blood work checked by his nephrologist and things looked well. His psoriasis is quite well controlled.  I discussed the assessment and plan above with Dr. Durene Cal. Agree with the plan. With Thanksgiving pending and still being edematous/volume overloaded, all have him stay on 80 twice a day Lasix throughout the week and then go back to 60 twice a day. Manual start back to his pain regimen of sliding scale 20 on his weights. His blood pressure is well-controlled today, if anything a little low. He really doesn't have much in the way of heart failure symptoms currently besides the edema. He is on hydralazine/nitrate for afterload reduction. Once his renal function improves I would like to put him back on an ACE inhibitor which may mean need  to back off on the carvedilol.  I do still want him on hydralazine/nitrate combination however as the nitrate will provide antianginal effect. We may be of the switch to BiDil is able to increase that dose, we'll continue to monitor to see if is able to be done that way since the data would support the use of the combination medication over individual medication. As long as he is on the nitrate plus Ranexa his angina as these are well-controlled.  Remains on fenofibrate plus atorvastatin along for his lipids. His primary physician has been handling this.  No bleeding complications of warfarin.  Followup in 6 months  DAVID W. Herbie Baltimore, [name], M.S. [facility] [address] [place], [place]  [address]  [phone] Pager # [phone]

## 2013-07-27 NOTE — Assessment & Plan Note (Signed)
No chest pain or shortness of breath when on lasix and ranexa. Suspect when patient ran out of both he had increased demands from CHF and symptoms worsened. As long as patient asymptomatic will not pursue stress test. Given creatinine up to 2.5 so catheterization would not be without risk.

## 2013-07-27 NOTE — Patient Instructions (Signed)
Take 4 of your 20mg  lasix pills for the rest of this week then go back to 3 of your 20mg  pills after that.   If you gain more than 3 lbs above your baseline (4 lbs below what you are today-weigh today), then call us for advice.   See [place] in 6 months.

## 2013-07-27 NOTE — Assessment & Plan Note (Signed)
1+ to knees. Will increase lasix x 1 week to 80mg  BID then return to 60mg  BID.

## 2013-07-27 NOTE — Assessment & Plan Note (Addendum)
Weight up 5 lbs from last visit. Will increase lasix to 80mg  BID x 1 week then have patient return to 60mg  BID. Increased weight likely due to patient running out for at least a week within the last 2 months.

## 2013-07-28 ENCOUNTER — Encounter: Payer: Self-pay | Admitting: Cardiology

## 2013-07-28 NOTE — Assessment & Plan Note (Signed)
As always is on the nitrate and Ranexa he sees a doing well. I think he probably also has symptoms of his volume will get his. We discussed sliding scale Lasix therapy and will continue the nitrate plus Ranexa and beta blocker.

## 2013-12-23 NOTE — Telephone Encounter (Signed)
Encounter has been closed--TP 12/23/13 

## 2014-01-19 ENCOUNTER — Other Ambulatory Visit: Payer: Self-pay

## 2014-01-19 MED ORDER — NITROGLYCERIN 0.4 MG SL SUBL: 0.4000 mg | SUBLINGUAL_TABLET | SUBLINGUAL | Status: DC | PRN

## 2014-01-19 NOTE — Telephone Encounter (Signed)
Rx was sent to pharmacy electronically. 

## 2014-01-26 ENCOUNTER — Other Ambulatory Visit: Payer: Self-pay | Admitting: *Deleted

## 2014-01-26 DIAGNOSIS — I208 Other forms of angina pectoris: Secondary | ICD-10-CM

## 2014-01-26 MED ORDER — RANOLAZINE ER 500 MG PO TB12: 500.0000 mg | ORAL_TABLET | Freq: Every day | ORAL | Status: DC

## 2014-01-26 NOTE — Telephone Encounter (Signed)
Rx refill sent to patient pharmacy   

## 2015-02-16 ENCOUNTER — Inpatient Hospital Stay (HOSPITAL_COMMUNITY)
Admission: EM | Admit: 2015-02-16 | Discharge: 2015-02-21 | DRG: 872 | Disposition: A | Discharge status: Home Health | Payer: Medicare Other | Attending: Internal Medicine | Admitting: Internal Medicine

## 2015-02-16 ENCOUNTER — Emergency Department (HOSPITAL_COMMUNITY): Payer: Medicare Other

## 2015-02-16 ENCOUNTER — Encounter (HOSPITAL_COMMUNITY): Payer: Self-pay

## 2015-02-16 DIAGNOSIS — D649 Anemia, unspecified: Secondary | ICD-10-CM | POA: Diagnosis present

## 2015-02-16 DIAGNOSIS — R112 Nausea with vomiting, unspecified: Secondary | ICD-10-CM

## 2015-02-16 DIAGNOSIS — R7401 Elevation of levels of liver transaminase levels: Secondary | ICD-10-CM | POA: Diagnosis present

## 2015-02-16 DIAGNOSIS — R1013 Epigastric pain: Secondary | ICD-10-CM | POA: Diagnosis present

## 2015-02-16 DIAGNOSIS — M26609 Unspecified temporomandibular joint disorder, unspecified side: Secondary | ICD-10-CM | POA: Clinically undetermined

## 2015-02-16 DIAGNOSIS — I159 Secondary hypertension, unspecified: Secondary | ICD-10-CM | POA: Diagnosis present

## 2015-02-16 DIAGNOSIS — E860 Dehydration: Secondary | ICD-10-CM | POA: Diagnosis present

## 2015-02-16 DIAGNOSIS — F10939 Alcohol use, unspecified with withdrawal, unspecified: Secondary | ICD-10-CM

## 2015-02-16 DIAGNOSIS — F10239 Alcohol dependence with withdrawal, unspecified: Secondary | ICD-10-CM | POA: Diagnosis present

## 2015-02-16 DIAGNOSIS — A419 Sepsis, unspecified organism: Secondary | ICD-10-CM | POA: Diagnosis not present

## 2015-02-16 DIAGNOSIS — I82409 Acute embolism and thrombosis of unspecified deep veins of unspecified lower extremity: Secondary | ICD-10-CM | POA: Diagnosis present

## 2015-02-16 DIAGNOSIS — I10 Essential (primary) hypertension: Secondary | ICD-10-CM | POA: Diagnosis present

## 2015-02-16 DIAGNOSIS — E871 Hypo-osmolality and hyponatremia: Secondary | ICD-10-CM | POA: Diagnosis present

## 2015-02-16 DIAGNOSIS — K2970 Gastritis, unspecified, without bleeding: Secondary | ICD-10-CM | POA: Diagnosis present

## 2015-02-16 DIAGNOSIS — I208 Other forms of angina pectoris: Secondary | ICD-10-CM | POA: Diagnosis present

## 2015-02-16 DIAGNOSIS — Z7901 Long term (current) use of anticoagulants: Secondary | ICD-10-CM

## 2015-02-16 DIAGNOSIS — L409 Psoriasis, unspecified: Secondary | ICD-10-CM | POA: Diagnosis present

## 2015-02-16 DIAGNOSIS — Z9119 Patient's noncompliance with other medical treatment and regimen: Secondary | ICD-10-CM | POA: Diagnosis present

## 2015-02-16 DIAGNOSIS — Z86718 Personal history of other venous thrombosis and embolism: Secondary | ICD-10-CM

## 2015-02-16 DIAGNOSIS — H9190 Unspecified hearing loss, unspecified ear: Secondary | ICD-10-CM | POA: Diagnosis present

## 2015-02-16 DIAGNOSIS — K2920 Alcoholic gastritis without bleeding: Secondary | ICD-10-CM | POA: Diagnosis present

## 2015-02-16 DIAGNOSIS — Z951 Presence of aortocoronary bypass graft: Secondary | ICD-10-CM

## 2015-02-16 DIAGNOSIS — F1020 Alcohol dependence, uncomplicated: Secondary | ICD-10-CM | POA: Diagnosis present

## 2015-02-16 DIAGNOSIS — D696 Thrombocytopenia, unspecified: Secondary | ICD-10-CM | POA: Diagnosis present

## 2015-02-16 DIAGNOSIS — D6959 Other secondary thrombocytopenia: Secondary | ICD-10-CM | POA: Diagnosis present

## 2015-02-16 DIAGNOSIS — E876 Hypokalemia: Secondary | ICD-10-CM | POA: Diagnosis present

## 2015-02-16 DIAGNOSIS — N183 Chronic kidney disease, stage 3 unspecified: Secondary | ICD-10-CM | POA: Diagnosis present

## 2015-02-16 DIAGNOSIS — M2660 Temporomandibular joint disorder, unspecified: Secondary | ICD-10-CM | POA: Diagnosis present

## 2015-02-16 DIAGNOSIS — K219 Gastro-esophageal reflux disease without esophagitis: Secondary | ICD-10-CM | POA: Diagnosis present

## 2015-02-16 DIAGNOSIS — Z87891 Personal history of nicotine dependence: Secondary | ICD-10-CM

## 2015-02-16 DIAGNOSIS — Z833 Family history of diabetes mellitus: Secondary | ICD-10-CM

## 2015-02-16 DIAGNOSIS — I2510 Atherosclerotic heart disease of native coronary artery without angina pectoris: Secondary | ICD-10-CM | POA: Diagnosis present

## 2015-02-16 DIAGNOSIS — I25119 Atherosclerotic heart disease of native coronary artery with unspecified angina pectoris: Secondary | ICD-10-CM | POA: Diagnosis present

## 2015-02-16 DIAGNOSIS — R740 Nonspecific elevation of levels of transaminase and lactic acid dehydrogenase [LDH]: Secondary | ICD-10-CM

## 2015-02-16 DIAGNOSIS — I5042 Chronic combined systolic (congestive) and diastolic (congestive) heart failure: Secondary | ICD-10-CM | POA: Diagnosis present

## 2015-02-16 DIAGNOSIS — R000 Tachycardia, unspecified: Secondary | ICD-10-CM

## 2015-02-16 DIAGNOSIS — Z8673 Personal history of transient ischemic attack (TIA), and cerebral infarction without residual deficits: Secondary | ICD-10-CM

## 2015-02-16 DIAGNOSIS — K2270 Barrett's esophagus without dysplasia: Secondary | ICD-10-CM | POA: Diagnosis present

## 2015-02-16 DIAGNOSIS — E785 Hyperlipidemia, unspecified: Secondary | ICD-10-CM | POA: Diagnosis present

## 2015-02-16 LAB — COMPREHENSIVE METABOLIC PANEL
ALK PHOS: 184 U/L — AB (ref 38–126)
ALT: 79 U/L — AB (ref 17–63)
AST: 143 U/L — ABNORMAL HIGH (ref 15–41)
Albumin: 4.6 g/dL (ref 3.5–5.0)
Anion gap: 18 — ABNORMAL HIGH (ref 5–15)
BUN: 24 mg/dL — ABNORMAL HIGH (ref 6–20)
CHLORIDE: 86 mmol/L — AB (ref 101–111)
CO2: 22 mmol/L (ref 22–32)
Calcium: 10.6 mg/dL — ABNORMAL HIGH (ref 8.9–10.3)
Creatinine, Ser: 2.32 mg/dL — ABNORMAL HIGH (ref 0.61–1.24)
GFR calc non Af Amer: 27 mL/min — ABNORMAL LOW (ref >60)
GFR, EST AFRICAN AMERICAN: 32 mL/min — AB (ref >60)
Glucose, Bld: 188 mg/dL — ABNORMAL HIGH (ref 65–99)
Potassium: 3.3 mmol/L — ABNORMAL LOW (ref 3.5–5.1)
SODIUM: 126 mmol/L — AB (ref 135–145)
TOTAL PROTEIN: 9.2 g/dL — AB (ref 6.5–8.1)
Total Bilirubin: 2 mg/dL — ABNORMAL HIGH (ref 0.3–1.2)

## 2015-02-16 LAB — CBC WITH DIFFERENTIAL/PLATELET
Basophils Absolute: 0 10*3/uL (ref 0.0–0.1)
Basophils Relative: 0 % (ref 0–1)
Eosinophils Absolute: 0.1 10*3/uL (ref 0.0–0.7)
Eosinophils Relative: 1 % (ref 0–5)
HEMATOCRIT: 41.1 % (ref 39.0–52.0)
Hemoglobin: 14.3 g/dL (ref 13.0–17.0)
LYMPHS ABS: 2 10*3/uL (ref 0.7–4.0)
Lymphocytes Relative: 23 % (ref 12–46)
MCH: 29.4 pg (ref 26.0–34.0)
MCHC: 34.8 g/dL (ref 30.0–36.0)
MCV: 84.4 fL (ref 78.0–100.0)
MONO ABS: 1.1 10*3/uL — AB (ref 0.1–1.0)
Monocytes Relative: 13 % — ABNORMAL HIGH (ref 3–12)
NEUTROS ABS: 5.4 10*3/uL (ref 1.7–7.7)
Neutrophils Relative %: 63 % (ref 43–77)
PLATELETS: 86 10*3/uL — AB (ref 150–400)
RBC: 4.87 MIL/uL (ref 4.22–5.81)
RDW: 17.4 % — AB (ref 11.5–15.5)
WBC: 8.6 10*3/uL (ref 4.0–10.5)

## 2015-02-16 LAB — POC OCCULT BLOOD, ED: FECAL OCCULT BLD: NEGATIVE

## 2015-02-16 LAB — PROTIME-INR
INR: 0.99 (ref 0.00–1.49)
PROTHROMBIN TIME: 13.3 s (ref 11.6–15.2)

## 2015-02-16 LAB — LIPASE, BLOOD: LIPASE: 28 U/L (ref 22–51)

## 2015-02-16 LAB — ETHANOL: Alcohol, Ethyl (B): <5 mg/dL (ref <5)

## 2015-02-16 MED ORDER — VITAMIN B-1 100 MG PO TABS
100.0000 mg | ORAL_TABLET | Freq: Every day | ORAL | Status: DC
  Administered 2015-02-17: 100 mg via ORAL
  Filled 2015-02-16 (×2): qty 1

## 2015-02-16 MED ORDER — LORAZEPAM 2 MG/ML IJ SOLN
1.0000 mg | Freq: Once | INTRAMUSCULAR | Status: AC
Start: 2015-02-16 — End: 2015-02-16
  Administered 2015-02-16: 1 mg via INTRAVENOUS
  Filled 2015-02-16: qty 1

## 2015-02-16 MED ORDER — SODIUM CHLORIDE 0.9 % IV BOLUS (SEPSIS)
1000.0000 mL | Freq: Once | INTRAVENOUS | Status: AC
  Administered 2015-02-16: 1000 mL via INTRAVENOUS

## 2015-02-16 MED ORDER — ONDANSETRON HCL 4 MG/2ML IJ SOLN
4.0000 mg | Freq: Once | INTRAMUSCULAR | Status: AC
  Administered 2015-02-16: 4 mg via INTRAVENOUS
  Filled 2015-02-16: qty 2

## 2015-02-16 MED ORDER — THIAMINE HCL 100 MG/ML IJ SOLN
100.0000 mg | Freq: Every day | INTRAMUSCULAR | Status: DC
  Administered 2015-02-16: 100 mg via INTRAVENOUS
  Filled 2015-02-16: qty 2

## 2015-02-16 MED ORDER — METOCLOPRAMIDE HCL 5 MG/ML IJ SOLN
10.0000 mg | Freq: Once | INTRAMUSCULAR | Status: AC
  Administered 2015-02-16: 10 mg via INTRAVENOUS
  Filled 2015-02-16: qty 2

## 2015-02-16 MED ORDER — LORAZEPAM 2 MG/ML IJ SOLN
0.0000 mg | Freq: Four times a day (QID) | INTRAMUSCULAR | Status: DC
  Administered 2015-02-17: 2 mg via INTRAVENOUS

## 2015-02-16 MED ORDER — LORAZEPAM 2 MG/ML IJ SOLN
0.0000 mg | Freq: Two times a day (BID) | INTRAMUSCULAR | Status: DC
  Administered 2015-02-16: 2 mg via INTRAVENOUS
  Filled 2015-02-16 (×2): qty 1

## 2015-02-16 NOTE — ED Provider Notes (Signed)
CSN: [id number]     Arrival date & time 02/16/15  2011 History   First MD Initiated Contact with Patient 02/16/15 2144     Chief Complaint  Patient presents with  . Hiccups     (Consider location/radiation/quality/duration/timing/severity/associated sxs/prior Treatment) HPI JERYN CERNEY is a 69 y.o. male with history of hypertension, DVT, chronic kidney disease, coronary disease, alcohol abuse, presents to emergency department complaining of hiccups, nausea, vomiting, epigastric abdominal pain. Patient states his symptoms started 5 days ago. Started with pickups and then developed abdominal pain. States nausea and vomiting has been getting worse, admits to gagging himself in order to throw up, because he states is the only thing that makes him feel better. He denies any blood in his emesis or in his stool. He denies any dark or tarry stools. He also states he hasn't been able to drink alcohol in 3-4 days because of the vomiting. He states he normally drinks daily, large amounts of alcohol every day. He denies any chest pain, but admits to shortness of breath and anxiety. He denies any fever or chills. No cough. No diarrhea. States he has not had a bowel movement in several days.  Past Medical History  Diagnosis Date  . Hypertension   . DVT (deep venous thrombosis) 11/2012    bilateral  . Psoriasis   . Chronic kidney disease     CKD 3  . CAD in native artery 1999    CABG X 5 '99, Myoview low risk 5/12  . Hyperlipidemia   . Alcohol abuse, daily use   . Diastolic dysfunction March 2014    EF 45% by echo  . GERD (gastroesophageal reflux disease)   . Optic nerve ischemia   . Problems with hearing   . Vision problems   . Carotid bruit 03/20/2007    Carotid doppler - R & L bulbs and proximal ICA -  irregular nonhemodynamically significant plaque of 0-49% diameter reduction  . Edema   . Hypoalbuminemia   . Sinus tachycardia   . Hx-TIA (transient ischemic attack)   . Barrett's  esophagus 01/29/2013  . Stable angina     Chronic  . Cardiorenal syndrome with renal failure 03/23/2013  . Cardiomyopathy, ischemic - EF 45-50% March 2014 01/19/2013  . Chronic combined systolic and diastolic CHF, NYHA class 2    Past Surgical History  Procedure Laterality Date  . Doppler echocardiography  March 2012    EF of 45% to 50%, which was down from before. There was moderate aortic   . Nm myoview ltd  01/2011    inferior attenuation, low risk and no ischemia, either due to infarction or scar but no ischemia noted.   . 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 Cardiac catheterization  02/14/2007    Native Vessels: RCA - 100%, LAD 100% at D1, Cx 100% in AV (AV Groov branch - collaterals fill distal RPL).  Grafts: LIMA-LAD & SVG-OM1-OM2 patent; SVG-RCA & SVG-Ramus occluded  . Colonoscopy N/A 01/24/2013    Procedure: COLONOSCOPY;  Surgeon: Lafayette Dragon, MD;  Location: [facility] ENDOSCOPY;  Service: Endoscopy;  Laterality: N/A;  . Esophagogastroduodenoscopy N/A 01/24/2013    Procedure: ESOPHAGOGASTRODUODENOSCOPY (EGD);  Surgeon: Lafayette Dragon, MD;  Location: [facility] ENDOSCOPY;  Service: Endoscopy;  Laterality: N/A;  . Knee surgery    . Appendectomy     Family History  Problem Relation Age of Onset  . Diabetes Mellitus II Father   .  Psoriasis Father   . Scleroderma Mother   . Blindness Maternal Grandfather    History  Substance Use Topics  . Smoking status: Former Smoker -- 0.50 packs/day    Types: Cigarettes    Quit date: 11/17/2000  . Smokeless tobacco: Never Used     Comment: 69 yo  . Alcohol Use: 24.0 oz/week    40 Shots of liquor per week     Comment: four days PTA    Review of Systems  Constitutional: Positive for diaphoresis. Negative for fever and chills.  Respiratory: Positive for shortness of breath. Negative for cough and chest tightness.   Cardiovascular: Negative for chest pain, palpitations and leg  swelling.  Gastrointestinal: Positive for nausea, vomiting and abdominal pain. Negative for diarrhea and abdominal distention.  Genitourinary: Negative for dysuria, urgency, frequency and hematuria.  Musculoskeletal: Negative for myalgias, arthralgias, neck pain and neck stiffness.  Skin: Negative for rash.  Allergic/Immunologic: Negative for immunocompromised state.  Neurological: Positive for tremors. Negative for dizziness, weakness, light-headedness, numbness and headaches.  All other systems reviewed and are negative.     Allergies  Penicillins  Home Medications   Prior to Admission medications   Medication Sig Start Date End Date Taking? Authorizing Provider  aspirin EC 81 MG tablet Take 81 mg by mouth daily.   Yes Historical Provider, MD  atorvastatin (LIPITOR) 20 MG tablet Take 20 mg by mouth at bedtime.   Yes Historical Provider, MD  carvedilol (COREG) 25 MG tablet Take 1 tablet (25 mg total) by mouth 2 (two) times daily with a meal. 12/02/12  Yes Eugenie Filler, MD  dorzolamide-timolol (COSOPT) 22.3-6.8 MG/ML ophthalmic solution Place 1 drop into both eyes at bedtime.   Yes Historical Provider, MD  fenofibrate 160 MG tablet Take 160 mg by mouth daily.   Yes Historical Provider, MD  folic acid (FOLVITE) 1 MG tablet Take 1 tablet (1 mg total) by mouth daily. 02/01/13  Yes Brett Canales, PA-C  furosemide (LASIX) 20 MG tablet Take 3-4 tablets (60-80 mg total) by mouth 2 (two) times daily. Patient taking differently: Take 40 mg by mouth 2 (two) times daily.  07/27/13  Yes Marin Olp, MD  hydrALAZINE (APRESOLINE) 25 MG tablet Take 1.5 tablets (37.5 mg total) by mouth 2 (two) times daily. 02/01/13  Yes Brett Canales, PA-C  hydrocortisone cream 0.5 % Apply 1 application topically 2 (two) times daily.   Yes Historical Provider, MD  hydrOXYzine (ATARAX/VISTARIL) 10 MG tablet Take 10 mg by mouth 3 (three) times daily as needed for itching.   Yes Historical Provider, MD  isosorbide  mononitrate (IMDUR) 30 MG 24 hr tablet Take 1-2 tablets (30-60 mg total) by mouth daily. 02/01/13  Yes Einar Pheasant Hager, PA-C  latanoprost (XALATAN) 0.005 % ophthalmic solution 1 drop at bedtime.   Yes Historical Provider, MD  MEPHYTON 5 MG tablet Take 5 mg by mouth daily.  03/01/13  Yes Historical Provider, MD  Multiple Vitamin (MULTIVITAMIN WITH MINERALS) TABS Take 1 tablet by mouth daily.   Yes Historical Provider, MD  nitroGLYCERIN (NITROSTAT) 0.4 MG SL tablet Place 1 tablet (0.4 mg total) under the tongue every 5 (five) minutes as needed for chest pain (hold for SBP < 110). 01/19/14  Yes Leonie Man, MD  oxyCODONE (OXY IR/ROXICODONE) 5 MG immediate release tablet Take 1 tablet (5 mg total) by mouth every 4 (four) hours as needed. 12/02/12  Yes Eugenie Filler, MD  potassium chloride SA (K-DUR,KLOR-CON) 20 MEQ tablet  Take 20 mEq by mouth daily.   Yes Historical Provider, MD  ranolazine (RANEXA) 500 MG 12 hr tablet Take 1 tablet (500 mg total) by mouth daily. 01/26/14  Yes Leonie Man, MD  thiamine 100 MG tablet Take 1 tablet (100 mg total) by mouth daily. 02/01/13  Yes Brett Canales, PA-C  Triamcinolone Acetonide (TRIAMCINOLONE 0.1 % CREAM : EUCERIN) CREA Apply 1 application topically 3 (three) times daily. 02/01/13  Yes Brett Canales, PA-C  warfarin (COUMADIN) 5 MG tablet Take 5 mg by mouth daily.   Yes Historical Provider, MD   BP 176/105 mmHg  Pulse 122  Temp(Src) 98.1 F (36.7 C) (Oral)  Resp 20  Ht 5\' 8"  (1.727 m)  Wt 195 lb (88.451 kg)  BMI 29.66 kg/m2  SpO2 100% Physical Exam  Constitutional: He is oriented to person, place, and time. He appears well-developed and well-nourished.  Actively vomiting  HENT:  Head: Normocephalic and atraumatic.  Eyes: Conjunctivae and EOM are normal. Pupils are equal, round, and reactive to light.  Neck: Normal range of motion. Neck supple.  Cardiovascular: Regular rhythm and normal heart sounds.   Tachycardic  Pulmonary/Chest: Effort normal and  breath sounds normal. No respiratory distress. He has no wheezes. He has no rales.  Abdominal: Soft. Bowel sounds are normal. He exhibits no distension. There is tenderness. There is no rebound.  Musculoskeletal: He exhibits no edema.  Neurological: He is alert and oriented to person, place, and time.  Intentional tremor noted  Skin: Skin is warm. He is diaphoretic.  Psychiatric:  Anxious appearing  Nursing note and vitals reviewed.   ED Course  Procedures (including critical care time) Labs Review Labs Reviewed  CBC WITH DIFFERENTIAL/PLATELET - Abnormal; Notable for the following:    RDW 17.4 (*)    All other components within normal limits  COMPREHENSIVE METABOLIC PANEL - Abnormal; Notable for the following:    Sodium 126 (*)    Potassium 3.3 (*)    Chloride 86 (*)    Glucose, Bld 188 (*)    BUN 24 (*)    Creatinine, Ser 2.32 (*)    Calcium 10.6 (*)    Total Protein 9.2 (*)    AST 143 (*)    ALT 79 (*)    Alkaline Phosphatase 184 (*)    Total Bilirubin 2.0 (*)    GFR calc non Af Amer 27 (*)    GFR calc Af Amer 32 (*)    Anion gap 18 (*)    All other components within normal limits  LIPASE, BLOOD  PROTIME-INR  URINALYSIS, ROUTINE W REFLEX MICROSCOPIC (NOT AT [facility])  ETHANOL  I-STAT TROPOININ, ED  POC OCCULT BLOOD, ED    Imaging Review Dg Chest 1 View  02/16/2015   CLINICAL DATA:  Intractable hiccups for 5 days.  EXAM: CHEST  1 VIEW  COMPARISON:  01/16/2013  FINDINGS: The cardiac silhouette, mediastinal and hilar contours are within normal limits and stable. Prominent right hilum is likely a slightly enlarged right pulmonary artery. This is a stable finding. 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 The lungs are clear. No pleural effusion. Numerous healed rib fractures are noted bilaterally.  IMPRESSION: No acute cardiopulmonary findings.   Electronically Signed   By: Marijo Sanes [name]   On: 02/16/2015 22:20     EKG Interpretation   Date/Time:   Wednesday February 16 2015 21:51:35 EDT Ventricular Rate:  114 PR Interval:  146 QRS Duration: 100 QT Interval:  344  QTC Calculation: 474 R Axis:   61 Text Interpretation:  Sinus tachycardia Anterior infarct , age  undetermined ST \\T \ T wave abnormality, consider inferior ischemia  Abnormal ECG No significant change since last tracing Confirmed by  Winfred Leeds  MD, SAM [phone]) on 02/16/2015 9:58:32 PM      MDM   Final diagnoses:  Non-intractable vomiting with nausea, vomiting of unspecified type  Epigastric pain  Alcohol withdrawal, with unspecified complication  Tachycardia  Secondary hypertension, unspecified    Patient seen and examined, patient appears to be very anxious, tremulous, here with nausea and vomiting, epigastric abdominal pain, possibly alcohol withdrawal. The patient is a daily drinker, wife states drinks large amount of alcohol daily. He has not been able to have an alcohol several days. I will start him on Ativan IV, IV fluids, labs, EKG ordered. Zofran for nausea. Will reassess. Patient is tachycardic, hypertensive.  10:52 PM Pt continues to be tachcyardic. Will give more fluids. GCS 15. At times confused. Labs show hypokalemia, hyponatremia, elevated bun and creat, elevated lfts. Pt in CIWA requiring multiple doses of ativan. Will admit for possible alcohol withdrawal, nausea, vomiting.    Spoke with triad, will admit  Filed Vitals:   02/17/15 1600 02/17/15 1700 02/17/15 1743 02/17/15 2000  BP: 105/61 104/70  101/56  Pulse: 89 86 87 79  Temp:    98.5 F (36.9 C)  TempSrc:    Oral  Resp: 20 18  21   Height:      Weight:      SpO2: 97% 97%  100%     Jeannett Senior, PA-C 02/17/15 2131  Orlie Dakin, MD 02/18/15 [phone]

## 2015-02-16 NOTE — ED Provider Notes (Signed)
Patient reports having hiccups 5 days ago. He also reports epigastric pain and vomiting multiple times today. Last drank alcohol 4 days ago. Patient appears tremulous. Glasgow Coma Score 15 HEENT exam mucous memory dry. Nonicteric neck supple lungs clear auscultation heart tachycardic regular rhythm abdomen nondistended nontender rectal normal tone brown stool Hemoccult-negative external result edema. Neurologic Glasgow Coma Score 15 moves all extremities well canners 2 through 12 grossly intact. Pt  appears to be withdrawing from alcohol  Orlie Dakin, MD 02/17/15 0040

## 2015-02-16 NOTE — ED Notes (Addendum)
Patient reports he has had hiccups x 5 days, occasionally vomiting.  He has not seen PMD.  Decided to come to ED tonight because of increased soreness in upper abdomen.  Wife, at bedside, reports he occasionally sticks his fingers down his throat to induce vomiting.  Patient reports it relieves the pressure when he does that.

## 2015-02-17 DIAGNOSIS — N189 Chronic kidney disease, unspecified: Secondary | ICD-10-CM | POA: Diagnosis not present

## 2015-02-17 DIAGNOSIS — F10239 Alcohol dependence with withdrawal, unspecified: Secondary | ICD-10-CM | POA: Diagnosis present

## 2015-02-17 DIAGNOSIS — E860 Dehydration: Secondary | ICD-10-CM | POA: Diagnosis present

## 2015-02-17 DIAGNOSIS — K2270 Barrett's esophagus without dysplasia: Secondary | ICD-10-CM | POA: Diagnosis present

## 2015-02-17 DIAGNOSIS — K219 Gastro-esophageal reflux disease without esophagitis: Secondary | ICD-10-CM | POA: Diagnosis present

## 2015-02-17 DIAGNOSIS — Z7901 Long term (current) use of anticoagulants: Secondary | ICD-10-CM | POA: Diagnosis not present

## 2015-02-17 DIAGNOSIS — R112 Nausea with vomiting, unspecified: Secondary | ICD-10-CM | POA: Diagnosis not present

## 2015-02-17 DIAGNOSIS — I82409 Acute embolism and thrombosis of unspecified deep veins of unspecified lower extremity: Secondary | ICD-10-CM

## 2015-02-17 DIAGNOSIS — Z833 Family history of diabetes mellitus: Secondary | ICD-10-CM | POA: Diagnosis not present

## 2015-02-17 DIAGNOSIS — Z8673 Personal history of transient ischemic attack (TIA), and cerebral infarction without residual deficits: Secondary | ICD-10-CM | POA: Diagnosis not present

## 2015-02-17 DIAGNOSIS — F1020 Alcohol dependence, uncomplicated: Secondary | ICD-10-CM | POA: Diagnosis not present

## 2015-02-17 DIAGNOSIS — I10 Essential (primary) hypertension: Secondary | ICD-10-CM

## 2015-02-17 DIAGNOSIS — F10939 Alcohol use, unspecified with withdrawal, unspecified: Secondary | ICD-10-CM | POA: Diagnosis present

## 2015-02-17 DIAGNOSIS — A419 Sepsis, unspecified organism: Secondary | ICD-10-CM | POA: Diagnosis present

## 2015-02-17 DIAGNOSIS — I5042 Chronic combined systolic (congestive) and diastolic (congestive) heart failure: Secondary | ICD-10-CM | POA: Diagnosis present

## 2015-02-17 DIAGNOSIS — Z9119 Patient's noncompliance with other medical treatment and regimen: Secondary | ICD-10-CM | POA: Diagnosis present

## 2015-02-17 DIAGNOSIS — N183 Chronic kidney disease, stage 3 (moderate): Secondary | ICD-10-CM | POA: Diagnosis present

## 2015-02-17 DIAGNOSIS — E876 Hypokalemia: Secondary | ICD-10-CM | POA: Diagnosis present

## 2015-02-17 DIAGNOSIS — R1013 Epigastric pain: Secondary | ICD-10-CM | POA: Diagnosis present

## 2015-02-17 DIAGNOSIS — E785 Hyperlipidemia, unspecified: Secondary | ICD-10-CM

## 2015-02-17 DIAGNOSIS — K2920 Alcoholic gastritis without bleeding: Secondary | ICD-10-CM | POA: Diagnosis present

## 2015-02-17 DIAGNOSIS — I25119 Atherosclerotic heart disease of native coronary artery with unspecified angina pectoris: Secondary | ICD-10-CM | POA: Diagnosis present

## 2015-02-17 DIAGNOSIS — D649 Anemia, unspecified: Secondary | ICD-10-CM | POA: Diagnosis present

## 2015-02-17 DIAGNOSIS — D696 Thrombocytopenia, unspecified: Secondary | ICD-10-CM | POA: Diagnosis present

## 2015-02-17 DIAGNOSIS — Z86718 Personal history of other venous thrombosis and embolism: Secondary | ICD-10-CM | POA: Diagnosis not present

## 2015-02-17 DIAGNOSIS — Z87891 Personal history of nicotine dependence: Secondary | ICD-10-CM | POA: Diagnosis not present

## 2015-02-17 DIAGNOSIS — M2660 Temporomandibular joint disorder, unspecified: Secondary | ICD-10-CM | POA: Diagnosis present

## 2015-02-17 DIAGNOSIS — H9190 Unspecified hearing loss, unspecified ear: Secondary | ICD-10-CM | POA: Diagnosis present

## 2015-02-17 DIAGNOSIS — E871 Hypo-osmolality and hyponatremia: Secondary | ICD-10-CM | POA: Diagnosis present

## 2015-02-17 DIAGNOSIS — D6959 Other secondary thrombocytopenia: Secondary | ICD-10-CM | POA: Diagnosis present

## 2015-02-17 DIAGNOSIS — I82403 Acute embolism and thrombosis of unspecified deep veins of lower extremity, bilateral: Secondary | ICD-10-CM | POA: Diagnosis not present

## 2015-02-17 DIAGNOSIS — I159 Secondary hypertension, unspecified: Secondary | ICD-10-CM | POA: Diagnosis present

## 2015-02-17 DIAGNOSIS — Z951 Presence of aortocoronary bypass graft: Secondary | ICD-10-CM | POA: Diagnosis not present

## 2015-02-17 DIAGNOSIS — L409 Psoriasis, unspecified: Secondary | ICD-10-CM | POA: Diagnosis present

## 2015-02-17 LAB — COMPREHENSIVE METABOLIC PANEL
ALBUMIN: 3.6 g/dL (ref 3.5–5.0)
ALT: 62 U/L (ref 17–63)
AST: 99 U/L — AB (ref 15–41)
Alkaline Phosphatase: 146 U/L — ABNORMAL HIGH (ref 38–126)
Anion gap: 8 (ref 5–15)
BILIRUBIN TOTAL: 1.3 mg/dL — AB (ref 0.3–1.2)
BUN: 22 mg/dL — ABNORMAL HIGH (ref 6–20)
CALCIUM: 9.3 mg/dL (ref 8.9–10.3)
CHLORIDE: 99 mmol/L — AB (ref 101–111)
CO2: 24 mmol/L (ref 22–32)
CREATININE: 1.8 mg/dL — AB (ref 0.61–1.24)
GFR calc Af Amer: 43 mL/min — ABNORMAL LOW (ref >60)
GFR calc non Af Amer: 37 mL/min — ABNORMAL LOW (ref >60)
Glucose, Bld: 131 mg/dL — ABNORMAL HIGH (ref 65–99)
Potassium: 3.5 mmol/L (ref 3.5–5.1)
Sodium: 131 mmol/L — ABNORMAL LOW (ref 135–145)
Total Protein: 7.5 g/dL (ref 6.5–8.1)

## 2015-02-17 LAB — URINALYSIS, ROUTINE W REFLEX MICROSCOPIC
Bilirubin Urine: NEGATIVE
GLUCOSE, UA: NEGATIVE mg/dL
Hgb urine dipstick: NEGATIVE
Ketones, ur: NEGATIVE mg/dL
LEUKOCYTES UA: NEGATIVE
Nitrite: NEGATIVE
PH: 5 (ref 5.0–8.0)
Protein, ur: 30 mg/dL — AB
Specific Gravity, Urine: 1.025 (ref 1.005–1.030)
Urobilinogen, UA: 0.2 mg/dL (ref 0.0–1.0)

## 2015-02-17 LAB — GLUCOSE, CAPILLARY: GLUCOSE-CAPILLARY: 109 mg/dL — AB (ref 65–99)

## 2015-02-17 LAB — CBC
HEMATOCRIT: 37.3 % — AB (ref 39.0–52.0)
HEMOGLOBIN: 12.5 g/dL — AB (ref 13.0–17.0)
MCH: 28.7 pg (ref 26.0–34.0)
MCHC: 33.5 g/dL (ref 30.0–36.0)
MCV: 85.6 fL (ref 78.0–100.0)
Platelets: 76 10*3/uL — ABNORMAL LOW (ref 150–400)
RBC: 4.36 MIL/uL (ref 4.22–5.81)
RDW: 17.8 % — ABNORMAL HIGH (ref 11.5–15.5)
WBC: 8 10*3/uL (ref 4.0–10.5)

## 2015-02-17 LAB — BRAIN NATRIURETIC PEPTIDE: B NATRIURETIC PEPTIDE 5: 60.4 pg/mL (ref 0.0–100.0)

## 2015-02-17 LAB — MRSA PCR SCREENING: MRSA by PCR: NEGATIVE

## 2015-02-17 LAB — AMMONIA: Ammonia: 30 umol/L (ref 9–35)

## 2015-02-17 LAB — I-STAT CG4 LACTIC ACID, ED: Lactic Acid, Venous: 2.42 mmol/L (ref 0.5–2.0)

## 2015-02-17 LAB — MAGNESIUM: MAGNESIUM: 1.7 mg/dL (ref 1.7–2.4)

## 2015-02-17 LAB — TROPONIN I

## 2015-02-17 LAB — URINE MICROSCOPIC-ADD ON

## 2015-02-17 LAB — HEPARIN LEVEL (UNFRACTIONATED)
HEPARIN UNFRACTIONATED: 0.51 [IU]/mL (ref 0.30–0.70)
Heparin Unfractionated: 0.61 IU/mL (ref 0.30–0.70)

## 2015-02-17 LAB — PROCALCITONIN: Procalcitonin: 0.3 ng/mL

## 2015-02-17 LAB — LACTIC ACID, PLASMA: Lactic Acid, Venous: 1.9 mmol/L (ref 0.5–2.0)

## 2015-02-17 MED ORDER — CHLORPROMAZINE HCL 25 MG PO TABS
25.0000 mg | ORAL_TABLET | Freq: Four times a day (QID) | ORAL | Status: DC | PRN
  Administered 2015-02-17: 25 mg via ORAL
  Filled 2015-02-17 (×3): qty 1

## 2015-02-17 MED ORDER — ASPIRIN EC 81 MG PO TBEC
81.0000 mg | DELAYED_RELEASE_TABLET | Freq: Every day | ORAL | Status: DC
  Administered 2015-02-17 – 2015-02-21 (×5): 81 mg via ORAL
  Filled 2015-02-17 (×6): qty 1

## 2015-02-17 MED ORDER — ADULT MULTIVITAMIN W/MINERALS CH
1.0000 | ORAL_TABLET | Freq: Every day | ORAL | Status: DC
  Administered 2015-02-17 – 2015-02-21 (×5): 1 via ORAL
  Filled 2015-02-17 (×5): qty 1

## 2015-02-17 MED ORDER — PANTOPRAZOLE SODIUM 40 MG IV SOLR
40.0000 mg | Freq: Two times a day (BID) | INTRAVENOUS | Status: DC
  Administered 2015-02-17 – 2015-02-18 (×4): 40 mg via INTRAVENOUS
  Filled 2015-02-17 (×4): qty 40

## 2015-02-17 MED ORDER — ATORVASTATIN CALCIUM 10 MG PO TABS
20.0000 mg | ORAL_TABLET | Freq: Every day | ORAL | Status: DC
Start: 2015-02-17 — End: 2015-02-21
  Administered 2015-02-17 – 2015-02-20 (×4): 20 mg via ORAL
  Filled 2015-02-17 (×4): qty 2
  Filled 2015-02-17: qty 1

## 2015-02-17 MED ORDER — CHLORHEXIDINE GLUCONATE 0.12 % MT SOLN
15.0000 mL | Freq: Two times a day (BID) | OROMUCOSAL | Status: DC
  Administered 2015-02-17 – 2015-02-21 (×8): 15 mL via OROMUCOSAL
  Filled 2015-02-17 (×8): qty 15

## 2015-02-17 MED ORDER — DORZOLAMIDE HCL-TIMOLOL MAL 2-0.5 % OP SOLN
1.0000 [drp] | Freq: Every day | OPHTHALMIC | Status: DC
  Administered 2015-02-18 – 2015-02-20 (×3): 1 [drp] via OPHTHALMIC
  Filled 2015-02-17 (×2): qty 10

## 2015-02-17 MED ORDER — PHYTONADIONE 5 MG PO TABS
5.0000 mg | ORAL_TABLET | Freq: Every day | ORAL | Status: DC
Start: 2015-02-17 — End: 2015-02-17

## 2015-02-17 MED ORDER — VITAMIN B-1 100 MG PO TABS: 100.0000 mg | ORAL_TABLET | Freq: Every day | ORAL | Status: DC

## 2015-02-17 MED ORDER — POTASSIUM CHLORIDE 10 MEQ/100ML IV SOLN
10.0000 meq | INTRAVENOUS | Status: AC
  Administered 2015-02-17 (×2): 10 meq via INTRAVENOUS
  Filled 2015-02-17 (×2): qty 100

## 2015-02-17 MED ORDER — VITAMIN B-1 100 MG PO TABS
100.0000 mg | ORAL_TABLET | Freq: Every day | ORAL | Status: DC
  Administered 2015-02-18 – 2015-02-21 (×4): 100 mg via ORAL
  Filled 2015-02-17 (×4): qty 1

## 2015-02-17 MED ORDER — HEPARIN (PORCINE) IN NACL 100-0.45 UNIT/ML-% IJ SOLN
1300.0000 [IU]/h | INTRAMUSCULAR | Status: DC
  Administered 2015-02-17 (×2): 1300 [IU]/h via INTRAVENOUS
  Filled 2015-02-17 (×5): qty 250

## 2015-02-17 MED ORDER — SODIUM CHLORIDE 0.9 % IV SOLN
INTRAVENOUS | Status: DC
  Administered 2015-02-17: 75 mL/h via INTRAVENOUS

## 2015-02-17 MED ORDER — TRIAMCINOLONE 0.1 % CREAM:EUCERIN CREAM 1:1
1.0000 "application " | TOPICAL_CREAM | Freq: Three times a day (TID) | CUTANEOUS | Status: DC
  Administered 2015-02-17 – 2015-02-21 (×13): 1 via TOPICAL
  Filled 2015-02-17: qty 1

## 2015-02-17 MED ORDER — THIAMINE HCL 100 MG/ML IJ SOLN
100.0000 mg | Freq: Every day | INTRAMUSCULAR | Status: DC
  Filled 2015-02-17 (×2): qty 2

## 2015-02-17 MED ORDER — NITROGLYCERIN 0.4 MG SL SUBL: 0.4000 mg | SUBLINGUAL_TABLET | SUBLINGUAL | Status: DC | PRN

## 2015-02-17 MED ORDER — HYDROXYZINE HCL 50 MG/ML IM SOLN
25.0000 mg | Freq: Four times a day (QID) | INTRAMUSCULAR | Status: DC | PRN
  Filled 2015-02-17: qty 0.5

## 2015-02-17 MED ORDER — WARFARIN - PHARMACIST DOSING INPATIENT: Freq: Every day | Status: DC

## 2015-02-17 MED ORDER — FOLIC ACID 1 MG PO TABS
1.0000 mg | ORAL_TABLET | Freq: Every day | ORAL | Status: DC
  Administered 2015-02-17 – 2015-02-21 (×5): 1 mg via ORAL
  Filled 2015-02-17 (×5): qty 1

## 2015-02-17 MED ORDER — SODIUM CHLORIDE 0.9 % IV BOLUS (SEPSIS)
500.0000 mL | Freq: Once | INTRAVENOUS | Status: AC
  Administered 2015-02-17: 500 mL via INTRAVENOUS

## 2015-02-17 MED ORDER — SODIUM CHLORIDE 0.9 % IV SOLN
INTRAVENOUS | Status: AC
  Administered 2015-02-17: 04:00:00 via INTRAVENOUS

## 2015-02-17 MED ORDER — POTASSIUM CHLORIDE CRYS ER 20 MEQ PO TBCR
40.0000 meq | EXTENDED_RELEASE_TABLET | Freq: Once | ORAL | Status: AC
  Administered 2015-02-17: 40 meq via ORAL
  Filled 2015-02-17: qty 2

## 2015-02-17 MED ORDER — RANOLAZINE ER 500 MG PO TB12
500.0000 mg | ORAL_TABLET | Freq: Every day | ORAL | Status: DC
  Administered 2015-02-18 – 2015-02-21 (×4): 500 mg via ORAL
  Filled 2015-02-17 (×5): qty 1

## 2015-02-17 MED ORDER — CARVEDILOL 25 MG PO TABS
25.0000 mg | ORAL_TABLET | Freq: Two times a day (BID) | ORAL | Status: DC
  Administered 2015-02-17 – 2015-02-21 (×9): 25 mg via ORAL
  Filled 2015-02-17 (×2): qty 1
  Filled 2015-02-17: qty 2
  Filled 2015-02-17 (×2): qty 1
  Filled 2015-02-17: qty 2
  Filled 2015-02-17 (×5): qty 1

## 2015-02-17 MED ORDER — ALUM & MAG HYDROXIDE-SIMETH 200-200-20 MG/5ML PO SUSP: 30.0000 mL | Freq: Four times a day (QID) | ORAL | Status: DC | PRN

## 2015-02-17 MED ORDER — MORPHINE SULFATE 2 MG/ML IJ SOLN: 2.0000 mg | INTRAMUSCULAR | Status: DC | PRN

## 2015-02-17 MED ORDER — PANTOPRAZOLE SODIUM 40 MG IV SOLR
40.0000 mg | Freq: Once | INTRAVENOUS | Status: AC
  Administered 2015-02-17: 40 mg via INTRAVENOUS
  Filled 2015-02-17: qty 40

## 2015-02-17 MED ORDER — SODIUM CHLORIDE 0.9 % IV SOLN
Freq: Once | INTRAVENOUS | Status: AC
  Administered 2015-02-17: 02:00:00 via INTRAVENOUS

## 2015-02-17 MED ORDER — METOCLOPRAMIDE HCL 5 MG/ML IJ SOLN
10.0000 mg | Freq: Once | INTRAMUSCULAR | Status: AC
  Administered 2015-02-17: 10 mg via INTRAVENOUS
  Filled 2015-02-17: qty 2

## 2015-02-17 MED ORDER — SODIUM CHLORIDE 0.9 % IJ SOLN
3.0000 mL | Freq: Two times a day (BID) | INTRAMUSCULAR | Status: DC
  Administered 2015-02-17 – 2015-02-21 (×7): 3 mL via INTRAVENOUS

## 2015-02-17 MED ORDER — OXYCODONE HCL 5 MG PO TABS
5.0000 mg | ORAL_TABLET | ORAL | Status: DC | PRN
  Administered 2015-02-18 – 2015-02-21 (×8): 5 mg via ORAL
  Filled 2015-02-17 (×8): qty 1

## 2015-02-17 MED ORDER — ISOSORBIDE MONONITRATE ER 30 MG PO TB24
30.0000 mg | ORAL_TABLET | Freq: Every day | ORAL | Status: DC
  Administered 2015-02-18 – 2015-02-21 (×4): 30 mg via ORAL
  Filled 2015-02-17 (×5): qty 1

## 2015-02-17 MED ORDER — LATANOPROST 0.005 % OP SOLN
1.0000 [drp] | Freq: Every day | OPHTHALMIC | Status: DC
  Administered 2015-02-18 – 2015-02-20 (×3): 1 [drp] via OPHTHALMIC
  Filled 2015-02-17 (×2): qty 2.5

## 2015-02-17 MED ORDER — CETYLPYRIDINIUM CHLORIDE 0.05 % MT LIQD
7.0000 mL | Freq: Two times a day (BID) | OROMUCOSAL | Status: DC
  Administered 2015-02-17 – 2015-02-20 (×8): 7 mL via OROMUCOSAL

## 2015-02-17 MED ORDER — HYDRALAZINE HCL 25 MG PO TABS
37.5000 mg | ORAL_TABLET | Freq: Two times a day (BID) | ORAL | Status: DC
  Administered 2015-02-17 – 2015-02-21 (×8): 37.5 mg via ORAL
  Filled 2015-02-17 (×10): qty 2

## 2015-02-17 MED ORDER — WARFARIN SODIUM 5 MG PO TABS
7.5000 mg | ORAL_TABLET | Freq: Once | ORAL | Status: AC
Start: 2015-02-17 — End: 2015-02-17
  Administered 2015-02-17: 7.5 mg via ORAL
  Filled 2015-02-17: qty 1
  Filled 2015-02-17: qty 1.5

## 2015-02-17 MED ORDER — FENOFIBRATE 160 MG PO TABS
160.0000 mg | ORAL_TABLET | Freq: Every day | ORAL | Status: DC
  Administered 2015-02-18 – 2015-02-21 (×4): 160 mg via ORAL
  Filled 2015-02-17 (×5): qty 1

## 2015-02-17 NOTE — Progress Notes (Signed)
PT Cancellation Note  Patient Details Name: Devin Williams MRN: [id number] DOB: [birth year]   Cancelled Treatment:    Reason Eval/Treat Not Completed: Patient not medically ready. Spoke with RN-pt detoxing. Recommended PT check back another day.    Weston Anna, MPT Pager: [phone]

## 2015-02-17 NOTE — Progress Notes (Signed)
I have seen and assessed the patient and agree with Dr. Edgar Frisk assessment and plan. Patient still with pickups states abdominal pain improving. Place on gentle IV hydration. Continue supportive care. Continue Ativan withdrawal protocol. Will place on Thorazine when necessary for hiccups. Check a magnesium level. Replete electrolytes. Continue supportive care.

## 2015-02-17 NOTE — Progress Notes (Addendum)
ANTICOAGULATION CONSULT NOTE - Initial Consult  Pharmacy Consult for Heparin & Warfarin Indication: DVT  Allergies  Allergen Reactions  . Penicillins Other (See Comments)    Childhood allergy.    Patient Measurements: Height: 5\' 8"  (172.7 cm) Weight: 195 lb (88.451 kg) IBW/kg (Calculated) : 68.4 Heparin Dosing Weight: 86 kg   Vital Signs: Temp: 99 F (37.2 C) (06/16 0215) Temp Source: Oral (06/16 0215) BP: 157/107 mmHg (06/16 0300) Pulse Rate: 78 (06/16 0300)  Labs:  Recent Labs  02/16/15 2148 02/16/15 2152  HGB 14.3  --   HCT 41.1  --   PLT 86*  --   LABPROT 13.3  --   INR 0.99  --   CREATININE 2.32*  --   TROPONINI  --  <0.03    Estimated Creatinine Clearance: 33.4 mL/min (by C-G formula based on Cr of 2.32).   Medical History: Past Medical History  Diagnosis Date  . Hypertension   . DVT (deep venous thrombosis) 11/2012    bilateral  . Psoriasis   . Chronic kidney disease     CKD 3  . CAD in native artery 1999    CABG X 5 '99, Myoview low risk 5/12  . Hyperlipidemia   . Alcohol abuse, daily use   . Diastolic dysfunction March 2014    EF 45% by echo  . GERD (gastroesophageal reflux disease)   . Optic nerve ischemia   . Problems with hearing   . Vision problems   . Carotid bruit 03/20/2007    Carotid doppler - R & L bulbs and proximal ICA -  irregular nonhemodynamically significant plaque of 0-49% diameter reduction  . Edema   . Hypoalbuminemia   . Sinus tachycardia   . Hx-TIA (transient ischemic attack)   . Barrett's esophagus 01/29/2013  . Stable angina     Chronic  . Cardiorenal syndrome with renal failure 03/23/2013  . Cardiomyopathy, ischemic - EF 45-50% March 2014 01/19/2013  . Chronic combined systolic and diastolic CHF, NYHA class 2     Medications:  Scheduled:  . aspirin EC  81 mg Oral Daily  . atorvastatin  20 mg Oral QHS  . carvedilol  25 mg Oral BID WC  . dorzolamide-timolol  1 drop Both Eyes QHS  . fenofibrate  160 mg Oral Daily   . folic acid  1 mg Oral Daily  . hydrALAZINE  37.5 mg Oral BID  . isosorbide mononitrate  30-60 mg Oral Daily  . latanoprost  1 drop Both Eyes QHS  . LORazepam  0-4 mg Intravenous 4 times per day  . LORazepam  0-4 mg Intravenous Q12H  . multivitamin with minerals  1 tablet Oral Daily  . pantoprazole (PROTONIX) IV  40 mg Intravenous Q12H  . phytonadione  5 mg Oral Daily  . ranolazine  500 mg Oral Daily  . sodium chloride  3 mL Intravenous Q12H  . thiamine  100 mg Intravenous Daily  . thiamine  100 mg Oral Daily  . thiamine  100 mg Oral Daily  . triamcinolone 0.1 % cream : eucerin  1 application Topical TID   Infusions:  . sodium chloride    . heparin    . potassium chloride    . sodium chloride      Assessment:  69 yr male with h/o bilateral DVT (2014) on warfarin 5mg  daily PTA.  Last dose noted as taken on 6/13.  Patient also with RX on med rec for Vitamin K 5mg  PO daily with  last dose taken sometime this past week.  INR upon admission = 0.99 (Goal 2-3)  Pharmacy consulted to dose warfarin for treatment of past DVT as well as bridge with IV heparin   PLTC = 86 therefore will not give heparin bolus  Goal of Therapy:  Heparin level 0.3-0.7 units/ml Monitor platelets by anticoagulation protocol: Yes  INR 2-3   Plan:   Begin IV heparin infusion @ 1300 units/hr  Warfarin 7.5mg  po x 1 this AM (dose 1.5 x home dose per protocol due to subtherapeutic level)  Check heparin level 8 hr after heparin started  Follow heparin level & CBC daily  Check daily PT/INR  Airica Schwartzkopf, Toribio Harbour, PharmD 02/17/2015,3:17 AM

## 2015-02-17 NOTE — ED Notes (Signed)
Notified admitting MD abt critical labs

## 2015-02-17 NOTE — Progress Notes (Signed)
ANTICOAGULATION CONSULT NOTE - follow up  Pharmacy Consult for Heparin & warfarin Indication: DVT  Allergies  Allergen Reactions  . Penicillins Other (See Comments)    Childhood allergy.    Patient Measurements: Height: 5\' 8"  (172.7 cm) Weight: 190 lb 0.6 oz (86.2 kg) IBW/kg (Calculated) : 68.4 Heparin Dosing Weight: 86 kg   Vital Signs: Temp: 98.5 F (36.9 C) (06/16 1200) Temp Source: Oral (06/16 1200) BP: 109/61 mmHg (06/16 1400) Pulse Rate: 93 (06/16 1400)  Labs:  Recent Labs  02/16/15 2148 02/16/15 2152 02/17/15 0655 02/17/15 1425  HGB 14.3  --  12.5*  --   HCT 41.1  --  37.3*  --   PLT 86*  --  76*  --   LABPROT 13.3  --   --   --   INR 0.99  --   --   --   HEPARINUNFRC  --   --   --  0.51  CREATININE 2.32*  --  1.80*  --   TROPONINI  --  <0.03  --   --     Estimated Creatinine Clearance: 42.5 mL/min (by C-G formula based on Cr of 1.8).   Medical History: Past Medical History  Diagnosis Date  . Hypertension   . DVT (deep venous thrombosis) 11/2012    bilateral  . Psoriasis   . Chronic kidney disease     CKD 3  . CAD in native artery 1999    CABG X 5 '99, Myoview low risk 5/12  . Hyperlipidemia   . Alcohol abuse, daily use   . Diastolic dysfunction March 2014    EF 45% by echo  . GERD (gastroesophageal reflux disease)   . Optic nerve ischemia   . Problems with hearing   . Vision problems   . Carotid bruit 03/20/2007    Carotid doppler - R & L bulbs and proximal ICA -  irregular nonhemodynamically significant plaque of 0-49% diameter reduction  . Edema   . Hypoalbuminemia   . Sinus tachycardia   . Hx-TIA (transient ischemic attack)   . Barrett's esophagus 01/29/2013  . Stable angina     Chronic  . Cardiorenal syndrome with renal failure 03/23/2013  . Cardiomyopathy, ischemic - EF 45-50% March 2014 01/19/2013  . Chronic combined systolic and diastolic CHF, NYHA class 2     Medications:  Scheduled:  . sodium chloride   Intravenous STAT  .  antiseptic oral rinse  7 mL Mouth Rinse q12n4p  . aspirin EC  81 mg Oral Daily  . atorvastatin  20 mg Oral QHS  . carvedilol  25 mg Oral BID WC  . chlorhexidine  15 mL Mouth Rinse BID  . dorzolamide-timolol  1 drop Both Eyes QHS  . fenofibrate  160 mg Oral Daily  . folic acid  1 mg Oral Daily  . hydrALAZINE  37.5 mg Oral BID  . isosorbide mononitrate  30 mg Oral Daily  . latanoprost  1 drop Both Eyes QHS  . LORazepam  0-4 mg Intravenous 4 times per day  . LORazepam  0-4 mg Intravenous Q12H  . multivitamin with minerals  1 tablet Oral Daily  . pantoprazole (PROTONIX) IV  40 mg Intravenous Q12H  . ranolazine  500 mg Oral Daily  . sodium chloride  3 mL Intravenous Q12H  . [START ON 02/18/2015] thiamine  100 mg Oral Daily   Or  . [START ON 02/18/2015] thiamine IV  100 mg Intravenous Daily  . triamcinolone 0.1 % cream :  eucerin  1 application Topical TID  . Warfarin - Pharmacist Dosing Inpatient   Does not apply q1800   Infusions:  . sodium chloride 75 mL/hr (02/17/15 0840)  . heparin 1,300 Units/hr (02/17/15 1400)    Assessment:  69 yr male with h/o bilateral DVT (2014) on warfarin 5mg  daily PTA.  Last dose noted as taken on 6/13.  Patient also with RX on med rec for Vitamin K 5mg  PO daily with last dose taken sometime this past week.  INR upon admission = 0.99 (Goal 2-3)  Pharmacy consulted to dose warfarin for treatment of past DVT as well as bridge with IV heparin   PLTC = 86 therefore will not give heparin bolus  1st heparin level=0.51 (therapeutic)  Goal of Therapy:  Heparin level 0.3-0.7 units/ml Monitor platelets by anticoagulation protocol: Yes  INR 2-3   Plan:   Continue IV heparin infusion @ 1300 units/hr  Warfarin 7.5mg  po x 1 was given this AM  Check  level heparin level 6 hr (confirmatory)  Follow heparin level & CBC daily  Check daily PT/INR  Dolly Rias RPh 02/17/2015, 3:10 PM Pager [phone]

## 2015-02-17 NOTE — Progress Notes (Signed)
ANTICOAGULATION CONSULT - Brief Follow Up Note  Pharmacy Consult for IV Heparin Indication: history of DVT  Repeat heparin level remains therapeutic at 0.61 with infusion at 1300 units/hr.  Please see pharmacist note from earlier today for further detail.  Goal of Therapy:  Heparin level 0.3-0.7 units/ml  Plan: Continue heparin infusion at 1300 units/hr until INR therapeutic with warfarin. F/u heparin level with AM labs.  Hershal Coria, PharmD, BCPS Pager: [phone] 02/17/2015 9:12 PM

## 2015-02-17 NOTE — Care Management Note (Signed)
Case Management Note  Patient Details  Name: Devin Williams MRN: [id number] Date of Birth: [birth year]  Subjective/Objective:                hiccupis and new cardiac finding for nstemi    Action/Plan: home   Expected Discharge Date:       [id number]           Expected Discharge Plan:  Home/Self Care  In-House Referral:  NA  Discharge planning Services  CM Consult  Post Acute Care Choice:  NA Choice offered to:  NA  DME Arranged:  N/A DME Agency:  NA  HH Arranged:  NA HH Agency:  NA  Status of Service:  In process, will continue to follow  Medicare Important Message Given:    Date Medicare IM Given:    Medicare IM give by:    Date Additional Medicare IM Given:    Additional Medicare Important Message give by:     If discussed at [place] of Stay Meetings, dates discussed:    Additional Comments:  [name], RN 02/17/2015, 9:21 AM

## 2015-02-17 NOTE — H&P (Addendum)
[facility] History and Physical  Devin Williams BJY:[id number] DOB: 1946-02-08 DOA: 02/16/2015  Referring physician: ED physician PCP: Maggie Font, MD  Specialists:   Chief Complaint: Nausea, vomiting, abdominal pain  HPI: Devin Williams is a 69 y.o. male with PMH of hypertension, hyperlipidemia, GERD, CAD (post status of CABG), history of DVT (noncompliant to Coumadin), chronic kidney disease-status 3, alcohol abuse, combined systolic and diastolic congestive heart failure (EF of 45-50% with grade 1 diastolic dysfunction), history of TIA, who presents with nausea, vomiting and abdominal pain.  He reports that he has been having nausea, vomiting and abdominal pain for 5 days. Initially, started with pickups and then developed abdominal pain. His abdominal pain is located in epigastric area. No diarrhea. He states that nausea and vomiting have been getting worse. He tried gagging himself in order to throw up, because he states is the only thing that makes him feel better. No hematochezia or hematemesis. He also states he hasn't been able to drink alcohol in 3-4 days because of the vomiting. He states he normally drinks daily, large amounts of alcohol every day. No chest pain and cough, but has mild shortness of breath. no symptoms of a UTI.  In ED, patient was found to have lactate 2.42, INR 0.99, negative troponin, negative urinalysis, tachycardia, lipase 28, abnormal liver function with ALP 184, AST 143, ALT 79, total bilirubin 2.0, potassium 3.3, stable renal function. Chest x-ray is negative for acute abnormalities.   Where does patient live?   At home    Can patient participate in ADLs?   Some   Review of Systems:   General: no fevers, chills, no changes in body weight, has poor appetite, has fatigue HEENT: no blurry vision, hearing changes or sore throat Pulm: has dyspnea, no coughing, wheezing CV: no chest pain, palpitations Abd: has nausea, vomiting, abdominal pain, no  diarrhea, constipation GU: no dysuria, burning on urination, increased urinary frequency, hematuria  Ext: no leg edema Neuro: no unilateral weakness, numbness, or tingling, no vision change or hearing loss Skin: no rash MSK: No muscle spasm, no deformity, no limitation of range of movement in spin Heme: No easy bruising.  Travel history: No recent long distant travel.  Allergy:  Allergies  Allergen Reactions  . Penicillins Other (See Comments)    Childhood allergy.    Past Medical History  Diagnosis Date  . Hypertension   . DVT (deep venous thrombosis) 11/2012    bilateral  . Psoriasis   . Chronic kidney disease     CKD 3  . CAD in native artery 1999    CABG X 5 '99, Myoview low risk 5/12  . Hyperlipidemia   . Alcohol abuse, daily use   . Diastolic dysfunction March 2014    EF 45% by echo  . GERD (gastroesophageal reflux disease)   . Optic nerve ischemia   . Problems with hearing   . Vision problems   . Carotid bruit 03/20/2007    Carotid doppler - R & L bulbs and proximal ICA -  irregular nonhemodynamically significant plaque of 0-49% diameter reduction  . Edema   . Hypoalbuminemia   . Sinus tachycardia   . Hx-TIA (transient ischemic attack)   . Barrett's esophagus 01/29/2013  . Stable angina     Chronic  . Cardiorenal syndrome with renal failure 03/23/2013  . Cardiomyopathy, ischemic - EF 45-50% March 2014 01/19/2013  . Chronic combined systolic and diastolic CHF, NYHA class 2     Past Surgical  History  Procedure Laterality Date  . Doppler echocardiography  March 2012    EF of 45% to 50%, which was down from before. There was moderate aortic   . Nm myoview ltd  01/2011    inferior attenuation, low risk and no ischemia, either due to infarction or scar but no ischemia noted.   . 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 Cardiac catheterization  02/14/2007    Native Vessels: RCA - 100%, LAD 100% at D1,  Cx 100% in AV (AV Groov branch - collaterals fill distal RPL).  Grafts: LIMA-LAD & SVG-OM1-OM2 patent; SVG-RCA & SVG-Ramus occluded  . Colonoscopy N/A 01/24/2013    Procedure: COLONOSCOPY;  Surgeon: Lafayette Dragon, MD;  Location: [facility] ENDOSCOPY;  Service: Endoscopy;  Laterality: N/A;  . Esophagogastroduodenoscopy N/A 01/24/2013    Procedure: ESOPHAGOGASTRODUODENOSCOPY (EGD);  Surgeon: Lafayette Dragon, MD;  Location: [facility] ENDOSCOPY;  Service: Endoscopy;  Laterality: N/A;  . Knee surgery    . Appendectomy      Social History:  reports that he quit smoking about 14 years ago. His smoking use included Cigarettes. He smoked 0.50 packs per day. He has never used smokeless tobacco. He reports that he drinks about 24.0 oz of alcohol per week. He reports that he does not use illicit drugs.  Family History:  Family History  Problem Relation Age of Onset  . Diabetes Mellitus II Father   . Psoriasis Father   . Scleroderma Mother   . Blindness Maternal Grandfather      Prior to Admission medications   Medication Sig Start Date End Date Taking? Authorizing Provider  aspirin EC 81 MG tablet Take 81 mg by mouth daily.   Yes Historical Provider, MD  atorvastatin (LIPITOR) 20 MG tablet Take 20 mg by mouth at bedtime.   Yes Historical Provider, MD  carvedilol (COREG) 25 MG tablet Take 1 tablet (25 mg total) by mouth 2 (two) times daily with a meal. 12/02/12  Yes Eugenie Filler, MD  dorzolamide-timolol (COSOPT) 22.3-6.8 MG/ML ophthalmic solution Place 1 drop into both eyes at bedtime.   Yes Historical Provider, MD  fenofibrate 160 MG tablet Take 160 mg by mouth daily.   Yes Historical Provider, MD  folic acid (FOLVITE) 1 MG tablet Take 1 tablet (1 mg total) by mouth daily. 02/01/13  Yes Brett Canales, PA-C  furosemide (LASIX) 20 MG tablet Take 3-4 tablets (60-80 mg total) by mouth 2 (two) times daily. Patient taking differently: Take 40 mg by mouth 2 (two) times daily.  07/27/13  Yes Marin Olp, MD   hydrALAZINE (APRESOLINE) 25 MG tablet Take 1.5 tablets (37.5 mg total) by mouth 2 (two) times daily. 02/01/13  Yes Brett Canales, PA-C  hydrocortisone cream 0.5 % Apply 1 application topically 2 (two) times daily.   Yes Historical Provider, MD  hydrOXYzine (ATARAX/VISTARIL) 10 MG tablet Take 10 mg by mouth 3 (three) times daily as needed for itching.   Yes Historical Provider, MD  isosorbide mononitrate (IMDUR) 30 MG 24 hr tablet Take 1-2 tablets (30-60 mg total) by mouth daily. 02/01/13  Yes Einar Pheasant Hager, PA-C  latanoprost (XALATAN) 0.005 % ophthalmic solution 1 drop at bedtime.   Yes Historical Provider, MD  MEPHYTON 5 MG tablet Take 5 mg by mouth daily.  03/01/13  Yes Historical Provider, MD  Multiple Vitamin (MULTIVITAMIN WITH MINERALS) TABS Take 1 tablet by mouth daily.   Yes Historical Provider, MD  nitroGLYCERIN (NITROSTAT) 0.4 MG SL tablet Place 1 tablet (0.4 mg total) under the tongue every 5 (five) minutes as needed for chest pain (hold for SBP < 110). 01/19/14  Yes Leonie Man, MD  oxyCODONE (OXY IR/ROXICODONE) 5 MG immediate release tablet Take 1 tablet (5 mg total) by mouth every 4 (four) hours as needed. 12/02/12  Yes Eugenie Filler, MD  potassium chloride SA (K-DUR,KLOR-CON) 20 MEQ tablet Take 20 mEq by mouth daily.   Yes Historical Provider, MD  ranolazine (RANEXA) 500 MG 12 hr tablet Take 1 tablet (500 mg total) by mouth daily. 01/26/14  Yes Leonie Man, MD  thiamine 100 MG tablet Take 1 tablet (100 mg total) by mouth daily. 02/01/13  Yes Brett Canales, PA-C  Triamcinolone Acetonide (TRIAMCINOLONE 0.1 % CREAM : EUCERIN) CREA Apply 1 application topically 3 (three) times daily. 02/01/13  Yes Brett Canales, PA-C  warfarin (COUMADIN) 5 MG tablet Take 5 mg by mouth daily.   Yes Historical Provider, MD    Physical Exam: Filed Vitals:   02/17/15 0300 02/17/15 0405 02/17/15 0407 02/17/15 0600  BP: 157/107  141/79 109/67  Pulse: 78  113   Temp:      TempSrc:      Resp:   26    Height:      Weight:  86.2 kg (190 lb 0.6 oz)    SpO2: 82%  100%    General: Not in acute distress HEENT:       Eyes: PERRL, EOMI, no scleral icterus.       ENT: No discharge from the ears and nose, no pharynx injection, no tonsillar enlargement.        Neck: No JVD, no bruit, no mass felt. Heme: No neck lymph node enlargement. Cardiac: S1/S2, RRR, tachycardia, No murmurs, No gallops or rubs. Pulm: No rales, wheezing, rhonchi or rubs. Abd: Soft, nondistended, tenderness over epigastric area, no rebound pain, no organomegaly, BS present. Ext: No pitting leg edema bilaterally. 2+DP/PT pulse bilaterally. Musculoskeletal: No joint deformities, No joint redness or warmth, no limitation of ROM in spin. Skin: No rashes.  Neuro: Alert, oriented X3, cranial nerves II-XII grossly intact, muscle strength 5/5 in all extremities, sensation to light touch intact.  Psych: Patient is not psychotic, no suicidal or hemocidal ideation.  Labs on Admission:  Basic Metabolic Panel:  Recent Labs Lab 02/16/15 2148 02/17/15 0655  NA 126* 131*  K 3.3* 3.5  CL 86* 99*  CO2 22 24  GLUCOSE 188* 131*  BUN 24* 22*  CREATININE 2.32* 1.80*  CALCIUM 10.6* 9.3   Liver Function Tests:  Recent Labs Lab 02/16/15 2148 02/17/15 0655  AST 143* 99*  ALT 79* 62  ALKPHOS 184* 146*  BILITOT 2.0* 1.3*  PROT 9.2* 7.5  ALBUMIN 4.6 3.6    Recent Labs Lab 02/16/15 2148  LIPASE 28    Recent Labs Lab 02/17/15 0156  AMMONIA 30   CBC:  Recent Labs Lab 02/16/15 2148 02/17/15 0655  WBC 8.6 8.0  NEUTROABS 5.4  --   HGB 14.3 12.5*  HCT 41.1 37.3*  MCV 84.4 85.6  PLT 86* 76*   Cardiac Enzymes:  Recent Labs Lab 02/16/15 2152  TROPONINI <0.03    BNP (last 3 results)  Recent Labs  02/17/15 0655  BNP 60.4    ProBNP (last 3 results) No results for input(s): PROBNP in the last 8760 hours.  CBG: No results for input(s): GLUCAP in the last 168 hours.  Radiological  Exams on  Admission: Dg Chest 1 View  02/16/2015   CLINICAL DATA:  Intractable hiccups for 5 days.  EXAM: CHEST  1 VIEW  COMPARISON:  01/16/2013  FINDINGS: The cardiac silhouette, mediastinal and hilar contours are within normal limits and stable. Prominent right hilum is likely a slightly enlarged right pulmonary artery. This is a stable finding. 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 The lungs are clear. No pleural effusion. Numerous healed rib fractures are noted bilaterally.  IMPRESSION: No acute cardiopulmonary findings.   Electronically Signed   By: Marijo Sanes [name]   On: 02/16/2015 22:20    EKG: Independently reviewed.  Abnormal findings:  QTc interval 474, LAE  Assessment/Plan Principal Problem:   Nausea & vomiting Active Problems:   DVT (deep venous thrombosis) March 2014   CAD, 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 Last cath 2008 - med Rx. Negative Myoview 5/12   Alcoholism   HTN (hypertension)   Hyperlipidemia   Psoriasis   Transaminitis   Chronic anticoagulation   Barrett's esophagus, by biopsy 01/24/13   Chronic renal insufficiency, stage III (moderate)   Stable angina   Chronic combined systolic and diastolic CHF, NYHA class 2   Alcohol withdrawal   Sepsis   Hypokalemia  Nausea & vomiting and abdominal pain and sepsis: Most likely due to alcoholic gastritis. Lipase is negative, ruling out pancreatitis. He has abnormal liver function, indicating possible alcoholic hepatitis. Patient is septic on admission with the elevated lactate and tachycardia. No source of infection identified. Likely due to alcohol withdraw and gastritis.  -will admit to SDU -IV protonix  -Hydroxyzine for nausea and morphine for pain -will get Procalcitonin and trend lactic acid levels per sepsis protocol. -IVF: 2.5L of NS bolus in ED, followed by 125 cc/h  DVT (deep venous thrombosis) March 2014: supposed to be taking coumadin, but INR=0.99. -IV heparin bridging -coumadin per pharmacy  CAD: s/p of CABG X 5  '99. Last cath 2008 - med Rx. Negative Myoview 5/12: No chest pain currently. -Aspirin, Lipitor, Coreg, Imdur, Renolazine and prn NTG  Alcohol abuse: -Did counseling about the importance of quitting drinking -CIWA protocol  HTN: -Coreg, hydralazine -Hold Lasix due to sepsis  Chronic renal insufficiency, stage III (moderate): Stable. Previously creatinine was  2.48 on 04/21/13, his creatinine is 2.32 on admission. -Follow-up renal function, BMP  Chronic combined systolic and diastolic CHF, NYHA class 2: 2-D echo on 11/18/12 showed EF 45-50% with grade 1 diastolic dysfunction. Patient is clinically dry. No any leg edema. -Hold Lasix -Continue aspirin and Coreg  Hypokalemia: K= 3.3 on admission. - Repleted  DVT ppx: on IV Heparin and coumadin  Code Status: Full code Family Communication:  Yes, patient's wife at bed side Disposition Plan: Admit to inpatient   Date of Service 02/17/2015    Ivor Costa [facility] Pager [phone]  If 7PM-7AM, please contact night-coverage [email or web address] Password [email or web address] 02/17/2015, 8:04 AM

## 2015-02-17 NOTE — Progress Notes (Signed)
Date:  February 17, 2015 U.R. performed for needs and level of care. Will continue to follow for Case Management needs.  Velva Harman, RN, BSN, [place]   [phone]

## 2015-02-18 DIAGNOSIS — R1013 Epigastric pain: Secondary | ICD-10-CM

## 2015-02-18 DIAGNOSIS — I82403 Acute embolism and thrombosis of unspecified deep veins of lower extremity, bilateral: Secondary | ICD-10-CM

## 2015-02-18 DIAGNOSIS — D696 Thrombocytopenia, unspecified: Secondary | ICD-10-CM

## 2015-02-18 DIAGNOSIS — D649 Anemia, unspecified: Secondary | ICD-10-CM

## 2015-02-18 DIAGNOSIS — K2970 Gastritis, unspecified, without bleeding: Secondary | ICD-10-CM | POA: Diagnosis present

## 2015-02-18 LAB — CBC
HCT: 31.3 % — ABNORMAL LOW (ref 39.0–52.0)
HEMOGLOBIN: 10.5 g/dL — AB (ref 13.0–17.0)
MCH: 29.4 pg (ref 26.0–34.0)
MCHC: 33.5 g/dL (ref 30.0–36.0)
MCV: 87.7 fL (ref 78.0–100.0)
PLATELETS: 74 10*3/uL — AB (ref 150–400)
RBC: 3.57 MIL/uL — ABNORMAL LOW (ref 4.22–5.81)
RDW: 18.4 % — AB (ref 11.5–15.5)
WBC: 4.6 10*3/uL (ref 4.0–10.5)

## 2015-02-18 LAB — COMPREHENSIVE METABOLIC PANEL
ALK PHOS: 109 U/L (ref 38–126)
ALT: 57 U/L (ref 17–63)
AST: 109 U/L — ABNORMAL HIGH (ref 15–41)
Albumin: 2.9 g/dL — ABNORMAL LOW (ref 3.5–5.0)
Anion gap: 7 (ref 5–15)
BUN: 15 mg/dL (ref 6–20)
CALCIUM: 8.9 mg/dL (ref 8.9–10.3)
CHLORIDE: 106 mmol/L (ref 101–111)
CO2: 21 mmol/L — AB (ref 22–32)
Creatinine, Ser: 1.57 mg/dL — ABNORMAL HIGH (ref 0.61–1.24)
GFR, EST AFRICAN AMERICAN: 51 mL/min — AB (ref >60)
GFR, EST NON AFRICAN AMERICAN: 44 mL/min — AB (ref >60)
GLUCOSE: 105 mg/dL — AB (ref 65–99)
POTASSIUM: 3.7 mmol/L (ref 3.5–5.1)
Sodium: 134 mmol/L — ABNORMAL LOW (ref 135–145)
Total Bilirubin: 1.5 mg/dL — ABNORMAL HIGH (ref 0.3–1.2)
Total Protein: 6.1 g/dL — ABNORMAL LOW (ref 6.5–8.1)

## 2015-02-18 LAB — PROTIME-INR
INR: 1.24 (ref 0.00–1.49)
Prothrombin Time: 15.7 seconds — ABNORMAL HIGH (ref 11.6–15.2)

## 2015-02-18 LAB — HEPARIN LEVEL (UNFRACTIONATED)
HEPARIN UNFRACTIONATED: 0.61 [IU]/mL (ref 0.30–0.70)
Heparin Unfractionated: 0.79 IU/mL — ABNORMAL HIGH (ref 0.30–0.70)
Heparin Unfractionated: 0.71 IU/mL — ABNORMAL HIGH (ref 0.30–0.70)

## 2015-02-18 LAB — GLUCOSE, CAPILLARY: GLUCOSE-CAPILLARY: 84 mg/dL (ref 65–99)

## 2015-02-18 LAB — LACTIC ACID, PLASMA: Lactic Acid, Venous: 1 mmol/L (ref 0.5–2.0)

## 2015-02-18 MED ORDER — SUCRALFATE 1 GM/10ML PO SUSP
1.0000 g | Freq: Three times a day (TID) | ORAL | Status: DC
  Administered 2015-02-18 – 2015-02-21 (×13): 1 g via ORAL
  Filled 2015-02-18 (×13): qty 10

## 2015-02-18 MED ORDER — HYDROXYZINE HCL 10 MG PO TABS
10.0000 mg | ORAL_TABLET | Freq: Three times a day (TID) | ORAL | Status: DC | PRN
  Filled 2015-02-18: qty 1

## 2015-02-18 MED ORDER — HEPARIN (PORCINE) IN NACL 100-0.45 UNIT/ML-% IJ SOLN
950.0000 [IU]/h | INTRAMUSCULAR | Status: AC
Start: 2015-02-18 — End: 2015-02-19

## 2015-02-18 MED ORDER — HEPARIN (PORCINE) IN NACL 100-0.45 UNIT/ML-% IJ SOLN
1050.0000 [IU]/h | INTRAMUSCULAR | Status: DC
  Administered 2015-02-18: 1050 [IU]/h via INTRAVENOUS
  Filled 2015-02-18 (×2): qty 250

## 2015-02-18 MED ORDER — ACETAMINOPHEN 500 MG PO TABS
1000.0000 mg | ORAL_TABLET | Freq: Two times a day (BID) | ORAL | Status: DC
  Administered 2015-02-18 – 2015-02-21 (×7): 1000 mg via ORAL
  Filled 2015-02-18 (×7): qty 2

## 2015-02-18 MED ORDER — WARFARIN SODIUM 2.5 MG PO TABS
7.5000 mg | ORAL_TABLET | Freq: Once | ORAL | Status: AC
  Administered 2015-02-18: 7.5 mg via ORAL
  Filled 2015-02-18 (×2): qty 1

## 2015-02-18 MED ORDER — PANTOPRAZOLE SODIUM 40 MG PO TBEC
40.0000 mg | DELAYED_RELEASE_TABLET | Freq: Two times a day (BID) | ORAL | Status: DC
  Administered 2015-02-18 – 2015-02-21 (×6): 40 mg via ORAL
  Filled 2015-02-18 (×6): qty 1

## 2015-02-18 MED ORDER — LORAZEPAM 2 MG/ML IJ SOLN: 1.0000 mg | Freq: Four times a day (QID) | INTRAMUSCULAR | Status: AC | PRN

## 2015-02-18 MED ORDER — HEPARIN (PORCINE) IN NACL 100-0.45 UNIT/ML-% IJ SOLN
1200.0000 [IU]/h | INTRAMUSCULAR | Status: DC
  Filled 2015-02-18: qty 250

## 2015-02-18 MED ORDER — LORAZEPAM 1 MG PO TABS
1.0000 mg | ORAL_TABLET | Freq: Four times a day (QID) | ORAL | Status: AC | PRN
  Administered 2015-02-18: 1 mg via ORAL
  Filled 2015-02-18: qty 1

## 2015-02-18 NOTE — Evaluation (Signed)
Physical Therapy Evaluation Patient Details Name: GEVORK AYYAD MRN: [id number] DOB: [birth year] Today's Date: 02/18/2015   History of Present Illness  69 yo male admitted with N/V, sepsis, abd pain. Hx of HTN, DVT, ETOH abuse, TIA, sinus tachycardia, hearing/vision deficits.   Clinical Impression  On eval, pt required Min assist +2 for mobility-able to ambulate ~150 feet with 2 HHA. Unsteady and at risk for falls. Recommend HHPT, RW. Pt would benefit from daily ambulation with nursing supervision/assist.     Follow Up Recommendations Home health PT;Supervision for mobility/OOB    Equipment Recommendations  Rolling walker with 5" wheels    Recommendations for Other Services OT consult     Precautions / Restrictions Precautions Precautions: Fall Restrictions Weight Bearing Restrictions: No      Mobility  Bed Mobility               General bed mobility comments: pt sitting EOB  Transfers Overall transfer level: Needs assistance Equipment used: Rolling walker (2 wheeled) Transfers: Sit to/from Stand Sit to Stand: Min assist;+2 safety/equipment         General transfer comment: LOb x1 posteriorly with initial standing. Assist to rise, stabilize, control descent.   Ambulation/Gait Ambulation/Gait assistance: Min assist;+2 physical assistance;+2 safety/equipment Ambulation Distance (Feet): 150 Feet Assistive device: 2 person hand held assist Gait Pattern/deviations: Step-through pattern     General Gait Details: Assist to stabilize. Unsteady.   Stairs            Wheelchair Mobility    Modified Rankin (Stroke Patients Only)       Balance Overall balance assessment: Needs assistance         Standing balance support: During functional activity Standing balance-Leahy Scale: Poor                               Pertinent Vitals/Pain Pain Assessment: Faces Faces Pain Scale: Hurts little more Pain Location: jaw Pain  Intervention(s): Patient requesting pain meds-RN notified    Home Living Family/patient expects to be discharged to:: Private residence Living Arrangements: Children             Home Equipment: None      Prior Function Level of Independence: Independent               Hand Dominance        Extremity/Trunk Assessment   Upper Extremity Assessment: Defer to OT evaluation           Lower Extremity Assessment: Generalized weakness      Cervical / Trunk Assessment: Normal  Communication   Communication: HOH  Cognition Arousal/Alertness: Awake/alert Behavior During Therapy: WFL for tasks assessed/performed Overall Cognitive Status: Within Functional Limits for tasks assessed                      General Comments      Exercises        Assessment/Plan    PT Assessment Patient needs continued PT services  PT Diagnosis Difficulty walking;Generalized weakness   PT Problem List Decreased strength;Decreased activity tolerance;Decreased balance;Decreased mobility;Pain  PT Treatment Interventions DME instruction;Gait training;Functional mobility training;Therapeutic activities;Therapeutic exercise;Balance training   PT Goals (Current goals can be found in the Care Plan section) Acute Rehab PT Goals Patient Stated Goal: none stated PT Goal Formulation: With patient Time For Goal Achievement: 03/04/15 Potential to Achieve Goals: Good    Frequency Min 3X/week   Barriers to discharge  Co-evaluation               End of Session Equipment Utilized During Treatment: Gait belt Activity Tolerance: Patient tolerated treatment well Patient left: in chair;with call bell/phone within reach;with chair alarm set           Time: 1555-1610 PT Time Calculation (min) (ACUTE ONLY): 15 min   Charges:   PT Evaluation $Initial PT Evaluation Tier I: 1 Procedure     PT G Codes:        Weston Anna, MPT Pager: [phone]

## 2015-02-18 NOTE — Progress Notes (Signed)
[facility] PROGRESS NOTE  Devin Williams ATF:[id number] DOB: [birth year] DOA: 02/16/2015 PCP: Maggie Font, MD  Assessment/Plan: #1 nausea and vomiting/abdominal pain/probable gastritis Likely secondary to gastritis likely alcohol induced. Patient with clinical improvement. FOBT is negative. Hemoglobin has trended down likely dilutional effect. Continue PPI twice a day. Will add Carafate. Advance diet to a full liquid diet and advance as tolerated to a soft diet. Continue antiemetics. Continue supportive care. Alcohol cessation. Follow.  #2 history of DVT March 2014 Prior to admission patient was supposed to be on Coumadin however INR was subtherapeutic. Continue IV heparin bridge with Coumadin. Goal INR 2-3.  #3 coronary artery disease status post CABG 5 1999 Last cath was in 2008 which recommended medical treatment. Patient had a negative Myoview 5 2012. Patient is currently asymptomatic. Continue aspirin, Lipitor, Coreg,imdur,ranexa.  #4 alcohol abuse Alcohol cessation. Continue the Ativan withdrawal protocol. Continue thiamine, folic acid, multivitamin.  #5 hypertension Stable. Lasix on hold. Continue current regimen of Coreg, hydralazine,imdur.  #6 chronic kidney disease stage III Some improvement with creatinine which is currently at 1.57. Saline lock IV fluids. Follow.  #7 elevated lactic acid Likely secondary to dehydration improved with hydration. Blood cultures pending. Chest x-ray negative for any acute abnormalities. Urinalysis is negative. Follow.  #8 ?? Lockjaw per patient We'll discuss with dental surgery. May need ENT input. Follow for now.  #9 thrombocytopenia Likely secondary to chronic alcohol use. No overt bleeding. Follow.  #10 hypokalemia Repleted.  #11 anemia Likely dilutional component. FOBT negative. No overt bleeding. Check an anemia panel. Follow H&H. Transfusion threshold hemoglobin less than 7.  #12 prophylaxis On heparin for DVT  prophylaxis.   Code Status: Full Family Communication: Updated patient. No family at bedside. Disposition Plan: Transfer to telemetry.   Consultants:  None  Procedures:  Chest x-ray 02/16/2015  Antibiotics:  None  HPI/Subjective: Patient c/o feeling like he has lock jaw. Patient denies any abdominal pain. No emesis. Hiccups improved.  Objective: Filed Vitals:   02/18/15 0913  BP: 134/77  Pulse:   Temp:   Resp:     Intake/Output Summary (Last 24 hours) at 02/18/15 1002 Last data filed at 02/18/15 0800  Gross per 24 hour  Intake 1934.22 ml  Output   1575 ml  Net 359.22 ml   Filed Weights   02/16/15 2047 02/17/15 0405 02/18/15 0400  Weight: 88.451 kg (195 lb) 86.2 kg (190 lb 0.6 oz) 88.9 kg (195 lb 15.8 oz)    Exam:   General:  NAD  Cardiovascular: RRR  Respiratory: CTAB anterior lung fields.  Abdomen: Soft, nontender, nondistended, positive bowel sounds.  Musculoskeletal: No c/c/e  Data Reviewed: Basic Metabolic Panel:  Recent Labs Lab 02/16/15 2148 02/17/15 0655 02/18/15 0350  NA 126* 131* 134*  K 3.3* 3.5 3.7  CL 86* 99* 106  CO2 22 24 21*  GLUCOSE 188* 131* 105*  BUN 24* 22* 15  CREATININE 2.32* 1.80* 1.57*  CALCIUM 10.6* 9.3 8.9  MG  --  1.7  --    Liver Function Tests:  Recent Labs Lab 02/16/15 2148 02/17/15 0655 02/18/15 0350  AST 143* 99* 109*  ALT 79* 62 57  ALKPHOS 184* 146* 109  BILITOT 2.0* 1.3* 1.5*  PROT 9.2* 7.5 6.1*  ALBUMIN 4.6 3.6 2.9*    Recent Labs Lab 02/16/15 2148  LIPASE 28    Recent Labs Lab 02/17/15 0156  AMMONIA 30   CBC:  Recent Labs Lab 02/16/15 2148 02/17/15 0655 02/18/15 0350  WBC 8.6  8.0 4.6  NEUTROABS 5.4  --   --   HGB 14.3 12.5* 10.5*  HCT 41.1 37.3* 31.3*  MCV 84.4 85.6 87.7  PLT 86* 76* 74*   Cardiac Enzymes:  Recent Labs Lab 02/16/15 2152  TROPONINI <0.03   BNP (last 3 results)  Recent Labs  02/17/15 0655  BNP 60.4    ProBNP (last 3 results) No results  for input(s): PROBNP in the last 8760 hours.  CBG:  Recent Labs Lab 02/17/15 0735  GLUCAP 109*    Recent Results (from the past 240 hour(s))  MRSA PCR Screening     Status: None   Collection Time: 02/17/15  4:03 AM  Result Value Ref Range Status   MRSA by PCR NEGATIVE NEGATIVE Final    Comment:        The GeneXpert MRSA Assay (FDA approved for NASAL specimens only), is one component of a comprehensive MRSA colonization surveillance program. It is not intended to diagnose MRSA infection nor to guide or monitor treatment for MRSA infections.      Studies: Dg Chest 1 View  02/16/2015   CLINICAL DATA:  Intractable hiccups for 5 days.  EXAM: CHEST  1 VIEW  COMPARISON:  01/16/2013  FINDINGS: The cardiac silhouette, mediastinal and hilar contours are within normal limits and stable. Prominent right hilum is likely a slightly enlarged right pulmonary artery. This is a stable finding. 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 The lungs are clear. No pleural effusion. Numerous healed rib fractures are noted bilaterally.  IMPRESSION: No acute cardiopulmonary findings.   Electronically Signed   By: Marijo Sanes [name]   On: 02/16/2015 22:20    Scheduled Meds: . antiseptic oral rinse  7 mL Mouth Rinse q12n4p  . aspirin EC  81 mg Oral Daily  . atorvastatin  20 mg Oral QHS  . carvedilol  25 mg Oral BID WC  . chlorhexidine  15 mL Mouth Rinse BID  . dorzolamide-timolol  1 drop Both Eyes QHS  . fenofibrate  160 mg Oral Daily  . folic acid  1 mg Oral Daily  . hydrALAZINE  37.5 mg Oral BID  . isosorbide mononitrate  30 mg Oral Daily  . latanoprost  1 drop Both Eyes QHS  . multivitamin with minerals  1 tablet Oral Daily  . pantoprazole (PROTONIX) IV  40 mg Intravenous Q12H  . ranolazine  500 mg Oral Daily  . sodium chloride  3 mL Intravenous Q12H  . sucralfate  1 g Oral TID AC & HS  . thiamine  100 mg Oral Daily   Or  . thiamine IV  100 mg Intravenous Daily  .  triamcinolone 0.1 % cream : eucerin  1 application Topical TID  . warfarin  7.5 mg Oral ONCE-1800  . Warfarin - Pharmacist Dosing Inpatient   Does not apply q1800   Continuous Infusions: . heparin 1,200 Units/hr (02/18/15 0626)    Principal Problem:   Nausea & vomiting Active Problems:   DVT (deep venous thrombosis) March 2014   CAD, 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 Last cath 2008 - med Rx. Negative Myoview 5/12   Alcoholism   HTN (hypertension)   Hyperlipidemia   Normocytic anemia   Psoriasis   Transaminitis   Chronic anticoagulation   Barrett's esophagus, by biopsy 01/24/13   Chronic renal insufficiency, stage III (moderate)   Stable angina   Chronic combined systolic and diastolic CHF, NYHA class 2   Alcohol withdrawal   Sepsis   Hypokalemia  Thrombocytopenia    Time spent: 21 minutes    THOMPSON,DANIEL [name] [facility] Pager [phone]. If 7PM-7AM, please contact night-coverage at [email or web address], password [facility] 02/18/2015, 10:02 AM  LOS: 1 day

## 2015-02-18 NOTE — Progress Notes (Signed)
ANTICOAGULATION CONSULT NOTE - follow up  Pharmacy Consult for Heparin & warfarin Indication: DVT  Allergies  Allergen Reactions  . Penicillins Other (See Comments)    Childhood allergy.    Patient Measurements: Height: 5\' 8"  (172.7 cm) Weight: 195 lb 15.8 oz (88.9 kg) IBW/kg (Calculated) : 68.4 Heparin Dosing Weight: 86 kg   Vital Signs: Temp: 98.5 F (36.9 C) (06/17 1200) Temp Source: Oral (06/17 1200) BP: 105/55 mmHg (06/17 1205) Pulse Rate: 80 (06/17 1205)  Labs:  Recent Labs  02/16/15 2148 02/16/15 2152 02/17/15 0655  02/17/15 2020 02/18/15 0350 02/18/15 1153  HGB 14.3  --  12.5*  --   --  10.5*  --   HCT 41.1  --  37.3*  --   --  31.3*  --   PLT 86*  --  76*  --   --  74*  --   LABPROT 13.3  --   --   --   --  15.7*  --   INR 0.99  --   --   --   --  1.24  --   HEPARINUNFRC  --   --   --   < > 0.61 0.79* 0.71*  CREATININE 2.32*  --  1.80*  --   --  1.57*  --   TROPONINI  --  <0.03  --   --   --   --   --   < > = values in this interval not displayed.  Estimated Creatinine Clearance: 49.5 mL/min (by C-G formula based on Cr of 1.57).   Medical History: Past Medical History  Diagnosis Date  . Hypertension   . DVT (deep venous thrombosis) 11/2012    bilateral  . Psoriasis   . Chronic kidney disease     CKD 3  . CAD in native artery 1999    CABG X 5 '99, Myoview low risk 5/12  . Hyperlipidemia   . Alcohol abuse, daily use   . Diastolic dysfunction March 2014    EF 45% by echo  . GERD (gastroesophageal reflux disease)   . Optic nerve ischemia   . Problems with hearing   . Vision problems   . Carotid bruit 03/20/2007    Carotid doppler - R & L bulbs and proximal ICA -  irregular nonhemodynamically significant plaque of 0-49% diameter reduction  . Edema   . Hypoalbuminemia   . Sinus tachycardia   . Hx-TIA (transient ischemic attack)   . Barrett's esophagus 01/29/2013  . Stable angina     Chronic  . Cardiorenal syndrome with renal failure 03/23/2013   . Cardiomyopathy, ischemic - EF 45-50% March 2014 01/19/2013  . Chronic combined systolic and diastolic CHF, NYHA class 2     Medications:  Scheduled:  . acetaminophen  1,000 mg Oral BID  . antiseptic oral rinse  7 mL Mouth Rinse q12n4p  . aspirin EC  81 mg Oral Daily  . atorvastatin  20 mg Oral QHS  . carvedilol  25 mg Oral BID WC  . chlorhexidine  15 mL Mouth Rinse BID  . dorzolamide-timolol  1 drop Both Eyes QHS  . fenofibrate  160 mg Oral Daily  . folic acid  1 mg Oral Daily  . hydrALAZINE  37.5 mg Oral BID  . isosorbide mononitrate  30 mg Oral Daily  . latanoprost  1 drop Both Eyes QHS  . multivitamin with minerals  1 tablet Oral Daily  . pantoprazole  40 mg Oral BID AC  .  ranolazine  500 mg Oral Daily  . sodium chloride  3 mL Intravenous Q12H  . sucralfate  1 g Oral TID AC & HS  . thiamine  100 mg Oral Daily   Or  . thiamine IV  100 mg Intravenous Daily  . triamcinolone 0.1 % cream : eucerin  1 application Topical TID  . warfarin  7.5 mg Oral ONCE-1800  . Warfarin - Pharmacist Dosing Inpatient   Does not apply q1800   Infusions:  . heparin 1,200 Units/hr (02/18/15 2957)    Assessment:  69 yr male with h/o bilateral DVT (2014) on warfarin 5mg  daily PTA.  Last dose noted as taken on 6/13.  Patient also with RX on med rec for Vitamin K 5mg  PO daily with last dose taken sometime this past week.  INR upon admission = 0.99 (Goal 2-3)  Pharmacy consulted to dose warfarin for treatment of past DVT as well as bridge with IV heparin   Today, 02/18/2015:  Heparin level = 0.71 (SUPRAtherapeutic) on 1200 units/hr (drawn 5h from dose change).  INR = 1.24  CBC: Hgb dec/trending down, pltc = 74 (stable) - possibly d/t EtOH abuse  Diet:  Drug interactions: no significant  Goal of Therapy:  Heparin level 0.3-0.7 units/ml, preferably 0.3-0.5 for thrombocytopenia Monitor platelets by anticoagulation protocol: Yes  INR 2-3   Plan:   Reduce heparin to 1050 units/hr, note  lower goal d/t thrombocytopenia  Warfarin 7.5mg  po x 1 tonight as ordered  Check  level heparin level 6 hr   Follow heparin level & CBC daily  Check daily PT/INR  Doreene Eland, PharmD, BCPS.   Pager: [phone] 02/18/2015 12:36 PM

## 2015-02-18 NOTE — Progress Notes (Signed)
Agree with shift assessment by ICU RN. Lind Guest, RN

## 2015-02-18 NOTE — Evaluation (Signed)
Occupational Therapy Evaluation Patient Details Name: Devin Williams MRN: [id number] DOB: 1946-05-18 Today's Date: 02/18/2015    History of Present Illness 69 yo male admitted with N/V, sepsis, abd pain. Hx of HTN, DVT, ETOH abuse, TIA, sinus tachycardia, hearing/vision deficits.    Clinical Impression   Pt was admitted for the above.  Pt reports he was independent with adls at baseline and that son and grandchildren live with him.  Pt needed min A x 2 for mobility at time of evaluation and min A for LB adls due to balance. Will follow in acute.  Goals are set for min guard to supervision level.    Follow Up Recommendations  SNF;Supervision/Assistance - 24 hour    Equipment Recommendations   (to be further assessed, ?3:1)    Recommendations for Other Services       Precautions / Restrictions Precautions Precautions: Fall Restrictions Weight Bearing Restrictions: No      Mobility Bed Mobility               General bed mobility comments: pt sitting EOB  Transfers Overall transfer level: Needs assistance Equipment used: Rolling walker (2 wheeled) Transfers: Sit to/from Stand Sit to Stand: Min assist;+1-2  For safety/equipment         General transfer comment: LOB once posteriorly.  Assist to rise and steady    Balance Overall balance assessment: Needs assistance         Standing balance support: During functional activity Standing balance-Leahy Scale: Poor                              ADL Overall ADL's : Needs assistance/impaired     Grooming: Set up;Sitting   Upper Body Bathing: Set up;Sitting   Lower Body Bathing: Minimal assistance;Sit to/from stand   Upper Body Dressing : Minimal assistance;Sitting (IV)   Lower Body Dressing: Minimal assistance;Sit to/from stand   Toilet Transfer: Minimal assistance;+2 for physical assistance (hand held assist, chair)             General ADL Comments: pt needed min A for balance for  ADLs.  +2 assistance for ambulating for safety as pt was off balance.  He is not used to using a walker.  Pt had a limp when ambulating.  At first, denied pain then said that his feet did hurt.  Not rated.  C/O jaw during session     Vision     Perception     Praxis      Pertinent Vitals/Pain Pain Assessment: Faces Faces Pain Scale: Hurts little more Pain Location: jaw Pain Intervention(s): Limited activity within patient's tolerance;Monitored during session;Patient requesting pain meds-RN notified     Hand Dominance     Extremity/Trunk Assessment Upper Extremity Assessment Upper Extremity Assessment: Overall WFL for tasks assessed      Cervical / Trunk Assessment Cervical / Trunk Assessment: Normal   Communication Communication Communication: HOH   Cognition Arousal/Alertness: Awake/alert Behavior During Therapy: WFL for tasks assessed/performed Overall Cognitive Status: No family/caregiver present to determine baseline cognitive functioning Area of Impairment: Memory               General Comments: pt had some difficulty answering questions and was very appropriate and on-target for questions he had.  Pt is aware that he is weaker than usual.     General Comments       Exercises       Shoulder Instructions  Home Living Family/patient expects to be discharged to:: Private residence Living Arrangements: Children                 Bathroom Shower/Tub: Occupational psychologist: [place]: None          Prior Functioning/Environment Level of Independence: Independent             OT Diagnosis: Generalized weakness   OT Problem List: Decreased strength;Decreased activity tolerance;Impaired balance (sitting and/or standing);Decreased knowledge of use of DME or AE;Decreased cognition   OT Treatment/Interventions: Self-care/ADL training;DME and/or AE instruction;Patient/family education;Balance training    OT  Goals(Current goals can be found in the care plan section) Acute Rehab OT Goals Patient Stated Goal: none stated OT Goal Formulation: With patient Time For Goal Achievement: 03/04/15 Potential to Achieve Goals: Good ADL Goals Pt Will Transfer to Toilet: with min guard assist;ambulating;regular height toilet;bedside commode (vs) Pt Will Perform Toileting - Clothing Manipulation and hygiene: with supervision;sit to/from stand Additional ADL Goal #1: pt will complete LB adls with set up/supervision, sit to stand  OT Frequency: Min 2X/week   Barriers to D/C:            Co-evaluation PT/OT/SLP Co-Evaluation/Treatment: Yes Reason for Co-Treatment: For patient/therapist safety PT goals addressed during session: Mobility/safety with mobility OT goals addressed during session: ADL's and self-care      End of Session    Activity Tolerance: Patient tolerated treatment well Patient left: in chair;with call bell/phone within reach;with chair alarm set   Time: 1555-1610 OT Time Calculation (min): 15 min Charges:  OT General Charges $OT Visit: 1 Procedure OT Evaluation $Initial OT Evaluation Tier I: 1 Procedure G-Codes:    Anisten Tomassi 02/23/15, 4:33 PM  Lesle Chris, OTR/L [phone] 2015-02-23

## 2015-02-18 NOTE — Progress Notes (Signed)
OT Cancellation Note  Patient Details Name: Devin Williams MRN: [id number] DOB: 12-26-1945   Cancelled Treatment:    Reason Eval/Treat Not Completed: Other (comment).  Pt just received meal and asked for therapy to return later.  Will try to do so.  Matthew Pais 02/18/2015, 3:36 PM  Lesle Chris, OTR/L [phone] 02/18/2015

## 2015-02-18 NOTE — Progress Notes (Signed)
ANTICOAGULATION CONSULT NOTE - follow up  Pharmacy Consult for Heparin & warfarin Indication: DVT  Allergies  Allergen Reactions  . Penicillins Other (See Comments)    Childhood allergy.    Patient Measurements: Height: 5\' 8"  (172.7 cm) Weight: 195 lb 15.8 oz (88.9 kg) IBW/kg (Calculated) : 68.4 Heparin Dosing Weight: 86 kg   Vital Signs: Temp: 98.3 F (36.8 C) (06/17 0400) Temp Source: Oral (06/17 0400) BP: 121/66 mmHg (06/17 0400) Pulse Rate: 80 (06/17 0400)  Labs:  Recent Labs  02/16/15 2148 02/16/15 2152 02/17/15 0655 02/17/15 1425 02/17/15 2020 02/18/15 0350  HGB 14.3  --  12.5*  --   --  10.5*  HCT 41.1  --  37.3*  --   --  31.3*  PLT 86*  --  76*  --   --  74*  LABPROT 13.3  --   --   --   --  15.7*  INR 0.99  --   --   --   --  1.24  HEPARINUNFRC  --   --   --  0.51 0.61 0.79*  CREATININE 2.32*  --  1.80*  --   --  1.57*  TROPONINI  --  <0.03  --   --   --   --     Estimated Creatinine Clearance: 49.5 mL/min (by C-G formula based on Cr of 1.57).   Medical History: Past Medical History  Diagnosis Date  . Hypertension   . DVT (deep venous thrombosis) 11/2012    bilateral  . Psoriasis   . Chronic kidney disease     CKD 3  . CAD in native artery 1999    CABG X 5 '99, Myoview low risk 5/12  . Hyperlipidemia   . Alcohol abuse, daily use   . Diastolic dysfunction March 2014    EF 45% by echo  . GERD (gastroesophageal reflux disease)   . Optic nerve ischemia   . Problems with hearing   . Vision problems   . Carotid bruit 03/20/2007    Carotid doppler - R & L bulbs and proximal ICA -  irregular nonhemodynamically significant plaque of 0-49% diameter reduction  . Edema   . Hypoalbuminemia   . Sinus tachycardia   . Hx-TIA (transient ischemic attack)   . Barrett's esophagus 01/29/2013  . Stable angina     Chronic  . Cardiorenal syndrome with renal failure 03/23/2013  . Cardiomyopathy, ischemic - EF 45-50% March 2014 01/19/2013  . Chronic combined  systolic and diastolic CHF, NYHA class 2     Medications:  Scheduled:  . antiseptic oral rinse  7 mL Mouth Rinse q12n4p  . aspirin EC  81 mg Oral Daily  . atorvastatin  20 mg Oral QHS  . carvedilol  25 mg Oral BID WC  . chlorhexidine  15 mL Mouth Rinse BID  . dorzolamide-timolol  1 drop Both Eyes QHS  . fenofibrate  160 mg Oral Daily  . folic acid  1 mg Oral Daily  . hydrALAZINE  37.5 mg Oral BID  . isosorbide mononitrate  30 mg Oral Daily  . latanoprost  1 drop Both Eyes QHS  . LORazepam  0-4 mg Intravenous 4 times per day  . LORazepam  0-4 mg Intravenous Q12H  . multivitamin with minerals  1 tablet Oral Daily  . pantoprazole (PROTONIX) IV  40 mg Intravenous Q12H  . ranolazine  500 mg Oral Daily  . sodium chloride  3 mL Intravenous Q12H  . thiamine  100 mg Oral Daily   Or  . thiamine IV  100 mg Intravenous Daily  . triamcinolone 0.1 % cream : eucerin  1 application Topical TID  . Warfarin - Pharmacist Dosing Inpatient   Does not apply q1800   Infusions:  . sodium chloride 75 mL/hr (02/17/15 0840)  . heparin      Assessment:  69 yr male with h/o bilateral DVT (2014) on warfarin 5mg  daily PTA.  Last dose noted as taken on 6/13.  Patient also with RX on med rec for Vitamin K 5mg  PO daily with last dose taken sometime this past week.  INR upon admission = 0.99 (Goal 2-3)  Pharmacy consulted to dose warfarin for treatment of past DVT as well as bridge with IV heparin   PLTC = 86 therefore will not give heparin bolus  First two heparin levels therapeutic on rate of 1300 units/hr; however this AM heparin level SUPRAtherapeutic = 2.42  No complications of therapy noted  INR increasing after first dose of warfarin on 6/16  Goal of Therapy:  Heparin level 0.3-0.7 units/ml Monitor platelets by anticoagulation protocol: Yes  INR 2-3   Plan:   Decrease IV heparin infusion to 1200 units/hr  Warfarin 7.5mg  po x 1 today  Check  level heparin level 6 hr after rate  decrease  Follow heparin level & CBC daily  Check daily PT/INR  Leone Haven, PharmD  02/18/2015, 5:27 AM

## 2015-02-18 NOTE — Progress Notes (Signed)
PHARMACY - HEPARIN (brief follow up note)  IV heparin infusing @ 1050 units/hr Heparin level = 0.61 (goal 0.3-0.5 due to low platelet count) No bleeding noted  Plan:  Reduce heparin to 950 units/hr            F/U AM labs  Leone Haven, PharmD 02/18/15 @ 22:33

## 2015-02-19 DIAGNOSIS — M2660 Temporomandibular joint disorder, unspecified: Secondary | ICD-10-CM

## 2015-02-19 DIAGNOSIS — N189 Chronic kidney disease, unspecified: Secondary | ICD-10-CM

## 2015-02-19 DIAGNOSIS — R112 Nausea with vomiting, unspecified: Secondary | ICD-10-CM | POA: Insufficient documentation

## 2015-02-19 DIAGNOSIS — M26609 Unspecified temporomandibular joint disorder, unspecified side: Secondary | ICD-10-CM | POA: Clinically undetermined

## 2015-02-19 LAB — PROTIME-INR
INR: 1.29 (ref 0.00–1.49)
Prothrombin Time: 16.2 seconds — ABNORMAL HIGH (ref 11.6–15.2)

## 2015-02-19 LAB — BASIC METABOLIC PANEL
Anion gap: 7 (ref 5–15)
BUN: 15 mg/dL (ref 6–20)
CHLORIDE: 107 mmol/L (ref 101–111)
CO2: 19 mmol/L — AB (ref 22–32)
Calcium: 8.9 mg/dL (ref 8.9–10.3)
Creatinine, Ser: 1.45 mg/dL — ABNORMAL HIGH (ref 0.61–1.24)
GFR calc non Af Amer: 48 mL/min — ABNORMAL LOW (ref >60)
GFR, EST AFRICAN AMERICAN: 56 mL/min — AB (ref >60)
Glucose, Bld: 106 mg/dL — ABNORMAL HIGH (ref 65–99)
POTASSIUM: 4 mmol/L (ref 3.5–5.1)
Sodium: 133 mmol/L — ABNORMAL LOW (ref 135–145)

## 2015-02-19 LAB — CBC
HCT: 29.1 % — ABNORMAL LOW (ref 39.0–52.0)
Hemoglobin: 9.8 g/dL — ABNORMAL LOW (ref 13.0–17.0)
MCH: 29.5 pg (ref 26.0–34.0)
MCHC: 33.7 g/dL (ref 30.0–36.0)
MCV: 87.7 fL (ref 78.0–100.0)
Platelets: 104 10*3/uL — ABNORMAL LOW (ref 150–400)
RBC: 3.32 MIL/uL — ABNORMAL LOW (ref 4.22–5.81)
RDW: 18.5 % — ABNORMAL HIGH (ref 11.5–15.5)
WBC: 3.8 10*3/uL — AB (ref 4.0–10.5)

## 2015-02-19 LAB — GLUCOSE, CAPILLARY: Glucose-Capillary: 93 mg/dL (ref 65–99)

## 2015-02-19 LAB — MAGNESIUM: Magnesium: 1.6 mg/dL — ABNORMAL LOW (ref 1.7–2.4)

## 2015-02-19 LAB — HEPARIN LEVEL (UNFRACTIONATED): Heparin Unfractionated: 0.51 IU/mL (ref 0.30–0.70)

## 2015-02-19 MED ORDER — SODIUM BICARBONATE 650 MG PO TABS
650.0000 mg | ORAL_TABLET | Freq: Two times a day (BID) | ORAL | Status: DC
  Administered 2015-02-19 – 2015-02-21 (×5): 650 mg via ORAL
  Filled 2015-02-19 (×5): qty 1

## 2015-02-19 MED ORDER — ENOXAPARIN (LOVENOX) PATIENT EDUCATION KIT
PACK | Freq: Once | Status: AC
  Administered 2015-02-19: 11:00:00
  Filled 2015-02-19: qty 1

## 2015-02-19 MED ORDER — WARFARIN SODIUM 7.5 MG PO TABS
7.5000 mg | ORAL_TABLET | Freq: Once | ORAL | Status: AC
  Administered 2015-02-19: 7.5 mg via ORAL
  Filled 2015-02-19: qty 1

## 2015-02-19 MED ORDER — ENOXAPARIN SODIUM 100 MG/ML ~~LOC~~ SOLN
90.0000 mg | Freq: Two times a day (BID) | SUBCUTANEOUS | Status: DC
  Administered 2015-02-19 (×2): 90 mg via SUBCUTANEOUS
  Filled 2015-02-19 (×2): qty 1

## 2015-02-19 MED ORDER — MAGNESIUM SULFATE 50 % IJ SOLN
3.0000 g | Freq: Once | INTRAVENOUS | Status: AC
  Administered 2015-02-19: 3 g via INTRAVENOUS
  Filled 2015-02-19: qty 6

## 2015-02-19 NOTE — Progress Notes (Signed)
Instructed pt and his wife on Lovenox injections, pt education packet given, pt's wife states she does not feel comfortable giving injections and is unsure pt will remain compliant with medication, is requesting CM consult about possible HH for injections

## 2015-02-19 NOTE — Progress Notes (Signed)
[facility] PROGRESS NOTE  Devin Williams NEEDS PPI:[id number] DOB: May 02, 1946 DOA: 02/16/2015 PCP: Maggie Font, MD  Assessment/Plan: #1 nausea and vomiting/abdominal pain/probable gastritis Likely secondary to gastritis likely alcohol induced. Patient with clinical improvement. FOBT is negative. Hemoglobin has trended down likely dilutional effect. Continue PPI twice a day, Carafate. Patient tolerating a soft diet. Continue antiemetics. Continue supportive care. Alcohol cessation. Follow.  #2 history of DVT March 2014 Prior to admission patient was supposed to be on Coumadin however INR was subtherapeutic. Change IV heparin to full dose Lovenox bridge. Continue Coumadin. Goal INR 2-3.   #3 coronary artery disease status post CABG 5 1999 Last cath was in 2008 which recommended medical treatment. Patient had a negative Myoview 5 2012. Patient is currently asymptomatic. Continue aspirin, Lipitor, Coreg,imdur,ranexa.  #4 alcohol abuse Alcohol cessation. Continue the Ativan withdrawal protocol. Continue thiamine, folic acid, multivitamin.  #5 hypertension Stable. Lasix on hold. Continue current regimen of Coreg, hydralazine,imdur.  #6 chronic kidney disease stage III Some improvement with creatinine which is currently at  1.45 from 1.57. Saline lock IV fluids. Follow.  #7 elevated lactic acid Likely secondary to dehydration improved with hydration. Blood cultures pending. Chest x-ray negative for any acute abnormalities. Urinalysis is negative. Follow.  #8 ?? 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 Unable to use NSAIDs due to current management of gastritis. Will need to follow-up with dentist as outpatient.  #9 thrombocytopenia Likely secondary to chronic alcohol use. No overt bleeding. Follow.  #10  hypokalemia Repleted.  #11 anemia Likely dilutional component. FOBT negative. No overt bleeding. Hemoglobin currently stable at 9.8. Follow H&H. Transfusion threshold hemoglobin less than 7.  #12 prophylaxis On heparin for DVT prophylaxis.   Code Status: Full Family Communication: Updated patient. No family at bedside. Disposition Plan: Hopefully home tomorrow.   Consultants:  None  Procedures:  Chest x-ray 02/16/2015  Antibiotics:  None  HPI/Subjective: Patient states some improvement with lockjaw. Patient denies any chest pain. No shortness of breath. No hiccups. No abdominal pain. Patient asking when he may be able to be discharged home.   Objective: Filed Vitals:   02/19/15 0631  BP: 118/66  Pulse: 73  Temp: 97.9 F (36.6 C)  Resp: 20    Intake/Output Summary (Last 24 hours) at 02/19/15 1344 Last data filed at 02/19/15 0908  Gross per 24 hour  Intake 1184.18 ml  Output    400 ml  Net 784.18 ml   Filed Weights   02/17/15 0405 02/18/15 0400 02/19/15 0700  Weight: 86.2 kg (190 lb 0.6 oz) 88.9 kg (195 lb 15.8 oz) 88.6 kg (195 lb 5.2 oz)    Exam:   General:  NAD  Cardiovascular: RRR  Respiratory: CTAB anterior lung fields.  Abdomen: Soft, nontender, nondistended, positive bowel sounds.  Musculoskeletal: No c/c/e  Data Reviewed: Basic Metabolic Panel:  Recent Labs Lab 02/16/15 2148 02/17/15 0655 02/18/15 0350 02/19/15 0420  NA 126* 131* 134* 133*  K 3.3* 3.5 3.7 4.0  CL 86* 99* 106 107  CO2 22 24 21* 19*  GLUCOSE 188* 131* 105* 106*  BUN 24* 22* 15 15  CREATININE 2.32* 1.80* 1.57* 1.45*  CALCIUM 10.6* 9.3 8.9 8.9  MG  --  1.7  --  1.6*   Liver Function Tests:  Recent Labs Lab 02/16/15 2148 02/17/15 0655 02/18/15 0350  AST 143*  99* 109*  ALT 79* 62 57  ALKPHOS 184* 146* 109  BILITOT 2.0* 1.3* 1.5*  PROT 9.2* 7.5 6.1*  ALBUMIN 4.6 3.6 2.9*    Recent Labs Lab 02/16/15 2148  LIPASE 28    Recent Labs Lab 02/17/15 0156   AMMONIA 30   CBC:  Recent Labs Lab 02/16/15 2148 02/17/15 0655 02/18/15 0350 02/19/15 0420  WBC 8.6 8.0 4.6 3.8*  NEUTROABS 5.4  --   --   --   HGB 14.3 12.5* 10.5* 9.8*  HCT 41.1 37.3* 31.3* 29.1*  MCV 84.4 85.6 87.7 87.7  PLT 86* 76* 74* 104*   Cardiac Enzymes:  Recent Labs Lab 02/16/15 2152  TROPONINI <0.03   BNP (last 3 results)  Recent Labs  02/17/15 0655  BNP 60.4    ProBNP (last 3 results) No results for input(s): PROBNP in the last 8760 hours.  CBG:  Recent Labs Lab 02/17/15 0735 02/18/15 0820 02/19/15 0732  GLUCAP 109* 84 93    Recent Results (from the past 240 hour(s))  MRSA PCR Screening     Status: None   Collection Time: 02/17/15  4:03 AM  Result Value Ref Range Status   MRSA by PCR NEGATIVE NEGATIVE Final    Comment:        The GeneXpert MRSA Assay (FDA approved for NASAL specimens only), is one component of a comprehensive MRSA colonization surveillance program. It is not intended to diagnose MRSA infection nor to guide or monitor treatment for MRSA infections.   Culture, blood (x 2)     Status: None (Preliminary result)   Collection Time: 02/17/15  6:50 AM  Result Value Ref Range Status   Specimen Description BLOOD LEFT HAND  Final   Special Requests BOTTLES DRAWN AEROBIC ONLY 2CC  Final   Culture   Final    NO GROWTH 1 DAY Performed at [facility]    Report Status PENDING  Incomplete  Culture, blood (x 2)     Status: None (Preliminary result)   Collection Time: 02/17/15  7:00 AM  Result Value Ref Range Status   Specimen Description BLOOD RIGHT ARM  Final   Special Requests BOTTLES DRAWN AEROBIC AND ANAEROBIC 5CC  Final   Culture   Final    NO GROWTH 1 DAY Performed at [facility]    Report Status PENDING  Incomplete     Studies: No results found.  Scheduled Meds: . acetaminophen  1,000 mg Oral BID  . antiseptic oral rinse  7 mL Mouth Rinse q12n4p  . aspirin EC  81 mg Oral Daily  . atorvastatin   20 mg Oral QHS  . carvedilol  25 mg Oral BID WC  . chlorhexidine  15 mL Mouth Rinse BID  . dorzolamide-timolol  1 drop Both Eyes QHS  . enoxaparin (LOVENOX) injection  90 mg Subcutaneous Q12H  . fenofibrate  160 mg Oral Daily  . folic acid  1 mg Oral Daily  . hydrALAZINE  37.5 mg Oral BID  . isosorbide mononitrate  30 mg Oral Daily  . latanoprost  1 drop Both Eyes QHS  . multivitamin with minerals  1 tablet Oral Daily  . pantoprazole  40 mg Oral BID AC  . ranolazine  500 mg Oral Daily  . sodium bicarbonate  650 mg Oral BID  . sodium chloride  3 mL Intravenous Q12H  . sucralfate  1 g Oral TID AC & HS  . thiamine  100 mg Oral Daily  Or  . thiamine IV  100 mg Intravenous Daily  . triamcinolone 0.1 % cream : eucerin  1 application Topical TID  . warfarin  7.5 mg Oral ONCE-1800  . Warfarin - Pharmacist Dosing Inpatient   Does not apply q1800   Continuous Infusions:    Principal Problem:   Nausea & vomiting Active Problems:   DVT (deep venous thrombosis) March 2014   CAD, 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 Last cath 2008 - med Rx. 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 [facility] Pager [phone]. If 7PM-7AM, please contact night-coverage at [email or web address], password [facility] 02/19/2015, 1:44 PM  LOS: 2 days

## 2015-02-19 NOTE — Progress Notes (Addendum)
ANTICOAGULATION CONSULT NOTE - follow up  Pharmacy Consult for Heparin --> enoxaparin & warfarin Indication: DVT  Allergies  Allergen Reactions  . Penicillins Other (See Comments)    Childhood allergy.    Patient Measurements: Height: 5\' 8"  (172.7 cm) Weight: 195 lb 5.2 oz (88.6 kg) IBW/kg (Calculated) : 68.4 Heparin Dosing Weight: 86 kg   Vital Signs: Temp: 97.9 F (36.6 C) (06/18 0631) Temp Source: Oral (06/18 0631) BP: 118/66 mmHg (06/18 0631) Pulse Rate: 73 (06/18 0631)  Labs:  Recent Labs  02/16/15 2148 02/16/15 2152 02/17/15 0655  02/18/15 0350 02/18/15 1153 02/18/15 2120 02/19/15 0420  HGB 14.3  --  12.5*  --  10.5*  --   --  9.8*  HCT 41.1  --  37.3*  --  31.3*  --   --  29.1*  PLT 86*  --  76*  --  74*  --   --  104*  LABPROT 13.3  --   --   --  15.7*  --   --  16.2*  INR 0.99  --   --   --  1.24  --   --  1.29  HEPARINUNFRC  --   --   --   < > 0.79* 0.71* 0.61 0.51  CREATININE 2.32*  --  1.80*  --  1.57*  --   --  1.45*  TROPONINI  --  <0.03  --   --   --   --   --   --   < > = values in this interval not displayed.  Estimated Creatinine Clearance: 53.5 mL/min (by C-G formula based on Cr of 1.45).   Medical History: Past Medical History  Diagnosis Date  . Hypertension   . DVT (deep venous thrombosis) 11/2012    bilateral  . Psoriasis   . Chronic kidney disease     CKD 3  . CAD in native artery 1999    CABG X 5 '99, Myoview low risk 5/12  . Hyperlipidemia   . Alcohol abuse, daily use   . Diastolic dysfunction March 2014    EF 45% by echo  . GERD (gastroesophageal reflux disease)   . Optic nerve ischemia   . Problems with hearing   . Vision problems   . Carotid bruit 03/20/2007    Carotid doppler - R & L bulbs and proximal ICA -  irregular nonhemodynamically significant plaque of 0-49% diameter reduction  . Edema   . Hypoalbuminemia   . Sinus tachycardia   . Hx-TIA (transient ischemic attack)   . Barrett's esophagus 01/29/2013  . Stable  angina     Chronic  . Cardiorenal syndrome with renal failure 03/23/2013  . Cardiomyopathy, ischemic - EF 45-50% March 2014 01/19/2013  . Chronic combined systolic and diastolic CHF, NYHA class 2     Medications:  Scheduled:  . acetaminophen  1,000 mg Oral BID  . antiseptic oral rinse  7 mL Mouth Rinse q12n4p  . aspirin EC  81 mg Oral Daily  . atorvastatin  20 mg Oral QHS  . carvedilol  25 mg Oral BID WC  . chlorhexidine  15 mL Mouth Rinse BID  . dorzolamide-timolol  1 drop Both Eyes QHS  . fenofibrate  160 mg Oral Daily  . folic acid  1 mg Oral Daily  . hydrALAZINE  37.5 mg Oral BID  . isosorbide mononitrate  30 mg Oral Daily  . latanoprost  1 drop Both Eyes QHS  . multivitamin with minerals  1 tablet Oral Daily  . pantoprazole  40 mg Oral BID AC  . ranolazine  500 mg Oral Daily  . sodium chloride  3 mL Intravenous Q12H  . sucralfate  1 g Oral TID AC & HS  . thiamine  100 mg Oral Daily   Or  . thiamine IV  100 mg Intravenous Daily  . triamcinolone 0.1 % cream : eucerin  1 application Topical TID  . Warfarin - Pharmacist Dosing Inpatient   Does not apply q1800   Infusions:  . heparin 950 Units/hr (02/18/15 2236)    Assessment:  69 yr male with h/o bilateral DVT (2014) on warfarin 5mg  daily PTA.  Last dose noted as taken on 6/13.  Patient also with RX on med rec for Vitamin K 5mg  PO daily with last dose taken sometime this past week.  INR upon admission = 0.99 (Goal 2-3)  Pharmacy consulted to dose warfarin for treatment of past DVT as well as bridge with IV heparin   Today, 02/19/2015:  Heparin level = 0.51 (therapeutic) on 950 units/hr   INR = 1.29 - slow to move following 2 doses of warfarin  CBC: Hgb dec/trending down, pltc = 104 (improving) - possibly d/t EtOH abuse  Diet:soft - eating very little per documentation  Drug interactions: no significant  Goal of Therapy:  Heparin level 0.3-0.7 units/ml, preferably 0.3-0.5 for thrombocytopenia Monitor platelets  by anticoagulation protocol: Yes  INR 2-3   Plan:   Continue 950 units/hr, note lower goal d/t thrombocytopenia (pltc improving)  Warfarin 7.5mg  po x 1 tonight (increase dose 6/19 if INR not trending up)  Follow INR, heparin level & CBC daily  Monitor for bleeding, Hgb trending down  Doreene Eland, PharmD, BCPS.   Pager: [phone] 02/19/2015 7:39 AM  Addendum: Dr. Grandville Silos wishes to change heparin gtt to enoxaparin for bridge therapy  Renal: SCr elevated/improving but CrCl  Is 66ml/min Weight 88.6kg  Plan:  Stop heparin gtt at 9am, given enoxaparin 90mg  (1mg /kg) SQ q24h at 10am (if platelets continue to improve, also appropriate to dose 1.5mg /kg (130mg ) q24h  Watch CBC and renal function  02/19/2015 8:22 AM Doreene Eland, PharmD, BCPS.   Pager: [phone]

## 2015-02-20 LAB — BASIC METABOLIC PANEL
Anion gap: 6 (ref 5–15)
BUN: 13 mg/dL (ref 6–20)
CALCIUM: 8.8 mg/dL — AB (ref 8.9–10.3)
CO2: 22 mmol/L (ref 22–32)
CREATININE: 1.56 mg/dL — AB (ref 0.61–1.24)
Chloride: 104 mmol/L (ref 101–111)
GFR calc Af Amer: 51 mL/min — ABNORMAL LOW (ref >60)
GFR calc non Af Amer: 44 mL/min — ABNORMAL LOW (ref >60)
GLUCOSE: 101 mg/dL — AB (ref 65–99)
Potassium: 3.7 mmol/L (ref 3.5–5.1)
Sodium: 132 mmol/L — ABNORMAL LOW (ref 135–145)

## 2015-02-20 LAB — CBC
HEMATOCRIT: 28.8 % — AB (ref 39.0–52.0)
Hemoglobin: 9.6 g/dL — ABNORMAL LOW (ref 13.0–17.0)
MCH: 29.1 pg (ref 26.0–34.0)
MCHC: 33.3 g/dL (ref 30.0–36.0)
MCV: 87.3 fL (ref 78.0–100.0)
Platelets: 119 10*3/uL — ABNORMAL LOW (ref 150–400)
RBC: 3.3 MIL/uL — ABNORMAL LOW (ref 4.22–5.81)
RDW: 18.1 % — ABNORMAL HIGH (ref 11.5–15.5)
WBC: 3.1 10*3/uL — ABNORMAL LOW (ref 4.0–10.5)

## 2015-02-20 LAB — PROTIME-INR
INR: 1.59 — ABNORMAL HIGH (ref 0.00–1.49)
Prothrombin Time: 19 seconds — ABNORMAL HIGH (ref 11.6–15.2)

## 2015-02-20 LAB — MAGNESIUM: Magnesium: 1.9 mg/dL (ref 1.7–2.4)

## 2015-02-20 LAB — GLUCOSE, CAPILLARY: Glucose-Capillary: 85 mg/dL (ref 65–99)

## 2015-02-20 MED ORDER — ENOXAPARIN SODIUM 150 MG/ML ~~LOC~~ SOLN
130.0000 mg | SUBCUTANEOUS | Status: DC
  Administered 2015-02-20 – 2015-02-21 (×2): 130 mg via SUBCUTANEOUS
  Filled 2015-02-20 (×2): qty 1

## 2015-02-20 MED ORDER — WARFARIN SODIUM 5 MG PO TABS
7.5000 mg | ORAL_TABLET | Freq: Once | ORAL | Status: AC
  Administered 2015-02-20: 7.5 mg via ORAL
  Filled 2015-02-20 (×2): qty 1

## 2015-02-20 NOTE — Care Management Note (Signed)
Case Management Note  Patient Details  Name: Devin Williams MRN: [id number] Date of Birth: 01/02/1946  Subjective/Objective:     Abdominal pain               Action/Plan: Home Health  Expected Discharge Date:  02/20/2015             Expected Discharge Plan:  [place]  In-House Referral:  NA  Discharge planning Services  CM Consult  Post Acute Care Choice:  NA, Home Health Choice offered to:  Patient  DME Arranged:  Hospital bed DME Agency:     HH Arranged:  RN, PT, OT, Nurse's Aide [place] Agency:  [place]  Status of Service:  Completed, signed off  Medicare Important Message Given:  Yes Date Medicare IM Given:  02/20/15 Medicare IM give by:  Jonnie Finner RN CCM Date Additional Medicare IM Given:    Additional Medicare Important Message give by:     If discussed at [place] of Stay Meetings, dates discussed:    Additional Comments: Consulted for Home Health and RW. NCM spoke to pt and he has RW at home. Provided [facility] list and offered choice. Pt requested [facility] for HH. Contacted [facility] for PT/INR checks at home results sent to PCP. Pt states he has been trained to administer his Lovenox shots and feels confident he can continue at home.  Erenest Rasher, RN 02/20/2015, 10:49 AM

## 2015-02-20 NOTE — Progress Notes (Signed)
Physical Therapy Treatment Patient Details Name: Devin Williams MRN: [id number] DOB: Dec 01, 1945 Today's Date: 02/20/2015    History of Present Illness 69 yo male admitted with N/V, sepsis, abd pain. Hx of HTN, DVT, ETOH abuse, TIA, sinus tachycardia, hearing/vision deficits.     PT Comments    Pt feeling better.  Tolerated amb around full unit with NO assistive devise.  Good alternating gait and NO LOB.    Follow Up Recommendations  Home health PT     Equipment Recommendations  Rolling walker with 5" wheels (pt might not be agreeable/confirm on D/C)    Recommendations for Other Services       Precautions / Restrictions Precautions Precautions: Fall Restrictions Weight Bearing Restrictions: No    Mobility  Bed Mobility Overal bed mobility: Modified Independent             General bed mobility comments: increased time  Transfers Overall transfer level: Needs assistance Equipment used: None Transfers: Sit to/from Stand Sit to Stand: Supervision         General transfer comment: good use of hands to steady self   Ambulation/Gait Ambulation/Gait assistance: Supervision Ambulation Distance (Feet): 450 Feet Assistive device: None Gait Pattern/deviations: Step-through pattern Gait velocity: WFL   General Gait Details: good alternating gait.  NO LOB and NO AD needed.  Good gait speed.   Stairs            Wheelchair Mobility    Modified Rankin (Stroke Patients Only)       Balance                                    Cognition Arousal/Alertness: Awake/alert Behavior During Therapy: WFL for tasks assessed/performed Overall Cognitive Status: Within Functional Limits for tasks assessed                      Exercises      General Comments        Pertinent Vitals/Pain Pain Assessment: No/denies pain Pain Location: some aches and pains, no where specific    Home Living                      Prior Function             PT Goals (current goals can now be found in the care plan section) Progress towards PT goals: Progressing toward goals    Frequency  Min 3X/week    PT Plan      Co-evaluation             End of Session Equipment Utilized During Treatment: Gait belt Activity Tolerance: Patient tolerated treatment well Patient left: in bed;with call bell/phone within reach;with bed alarm set     Time: 1100-1115 PT Time Calculation (min) (ACUTE ONLY): 15 min  Charges:  $Gait Training: 8-22 mins                    G Codes:      Rica Koyanagi  PTA [facility]  Acute  Rehab Pager      [phone]

## 2015-02-20 NOTE — Progress Notes (Signed)
[facility] PROGRESS NOTE  Devin Williams FBP:[id number] DOB: 30-Dec-1945 DOA: 02/16/2015 PCP: Maggie Font, MD  Assessment/Plan: #1 nausea and vomiting/abdominal pain/probable gastritis Likely secondary to gastritis likely alcohol induced. Patient with clinical improvement. FOBT is negative. Hemoglobin has trended down likely dilutional effect and has remained stable. Continue PPI twice a day, Carafate. Patient tolerating a soft diet. Continue antiemetics. Continue supportive care. Alcohol cessation. Follow.  #2 history of DVT March 2014 Prior to admission patient was supposed to be on Coumadin however INR was subtherapeutic. INR at 1.59. Continue Lovenox bridge with Coumadin. Goal INR 2-3.   #3 coronary artery disease status post CABG 5 1999 Last cath was in 2008 which recommended medical treatment. Patient had a negative Myoview 5 2012. Patient is currently asymptomatic. Continue aspirin, Lipitor, Coreg,imdur,ranexa.  #4 alcohol abuse Alcohol cessation. Continue the Ativan withdrawal protocol. Continue thiamine, folic acid, multivitamin.  #5 hypertension Stable. Lasix on hold. Continue current regimen of Coreg, hydralazine,imdur.  #6 chronic kidney disease stage III Some improvement with creatinine which is currently at  1.56. Stable. Follow.  #7 elevated lactic acid Likely secondary to dehydration improved with hydration. Blood cultures pending. Chest x-ray negative for any acute abnormalities. Urinalysis is negative. Follow.  #8 ?? 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 Unable to use NSAIDs due to current management of gastritis. Will need to follow-up with dentist as outpatient.  #9 thrombocytopenia Likely secondary to chronic alcohol use. No overt bleeding. Improving. Follow.  #10 hypokalemia Repleted.  #11  anemia Likely dilutional component. FOBT negative. No overt bleeding. Hemoglobin currently stable at 9.6. Follow H&H. Transfusion threshold hemoglobin less than 7.  #12 prophylaxis On lovenox for DVT prophylaxis.   Code Status: Full Family Communication: Updated patient. No family at bedside. Disposition Plan: Hopefully home tomorrow.   Consultants:  None  Procedures:  Chest x-ray 02/16/2015  Antibiotics:  None  HPI/Subjective: Patient denies CP. No SOB. No abdominal pain. Tolerating current diet.  Objective: Filed Vitals:   02/20/15 1027  BP: 119/68  Pulse:   Temp:   Resp:     Intake/Output Summary (Last 24 hours) at 02/20/15 1054 Last data filed at 02/20/15 0804  Gross per 24 hour  Intake   1120 ml  Output    275 ml  Net    845 ml   Filed Weights   02/17/15 0405 02/18/15 0400 02/19/15 0700  Weight: 86.2 kg (190 lb 0.6 oz) 88.9 kg (195 lb 15.8 oz) 88.6 kg (195 lb 5.2 oz)    Exam:   General:  NAD  Cardiovascular: RRR  Respiratory: CTAB anterior lung fields.  Abdomen: Soft, nontender, nondistended, positive bowel sounds.  Musculoskeletal: No c/c/e  Data Reviewed: Basic Metabolic Panel:  Recent Labs Lab 02/16/15 2148 02/17/15 0655 02/18/15 0350 02/19/15 0420 02/20/15 0455  NA 126* 131* 134* 133* 132*  K 3.3* 3.5 3.7 4.0 3.7  CL 86* 99* 106 107 104  CO2 22 24 21* 19* 22  GLUCOSE 188* 131* 105* 106* 101*  BUN 24* 22* 15 15 13   CREATININE 2.32* 1.80* 1.57* 1.45* 1.56*  CALCIUM 10.6* 9.3 8.9 8.9 8.8*  MG  --  1.7  --  1.6* 1.9   Liver Function Tests:  Recent Labs Lab 02/16/15 2148 02/17/15 0655 02/18/15 0350  AST 143* 99* 109*  ALT 79* 62 57  ALKPHOS 184*  146* 109  BILITOT 2.0* 1.3* 1.5*  PROT 9.2* 7.5 6.1*  ALBUMIN 4.6 3.6 2.9*    Recent Labs Lab 02/16/15 2148  LIPASE 28    Recent Labs Lab 02/17/15 0156  AMMONIA 30   CBC:  Recent Labs Lab 02/16/15 2148 02/17/15 0655 02/18/15 0350 02/19/15 0420 02/20/15 0455   WBC 8.6 8.0 4.6 3.8* 3.1*  NEUTROABS 5.4  --   --   --   --   HGB 14.3 12.5* 10.5* 9.8* 9.6*  HCT 41.1 37.3* 31.3* 29.1* 28.8*  MCV 84.4 85.6 87.7 87.7 87.3  PLT 86* 76* 74* 104* 119*   Cardiac Enzymes:  Recent Labs Lab 02/16/15 2152  TROPONINI <0.03   BNP (last 3 results)  Recent Labs  02/17/15 0655  BNP 60.4    ProBNP (last 3 results) No results for input(s): PROBNP in the last 8760 hours.  CBG:  Recent Labs Lab 02/17/15 0735 02/18/15 0820 02/19/15 0732 02/20/15 0728  GLUCAP 109* 84 93 85    Recent Results (from the past 240 hour(s))  MRSA PCR Screening     Status: None   Collection Time: 02/17/15  4:03 AM  Result Value Ref Range Status   MRSA by PCR NEGATIVE NEGATIVE Final    Comment:        The GeneXpert MRSA Assay (FDA approved for NASAL specimens only), is one component of a comprehensive MRSA colonization surveillance program. It is not intended to diagnose MRSA infection nor to guide or monitor treatment for MRSA infections.   Culture, blood (x 2)     Status: None (Preliminary result)   Collection Time: 02/17/15  6:50 AM  Result Value Ref Range Status   Specimen Description BLOOD LEFT HAND  Final   Special Requests BOTTLES DRAWN AEROBIC ONLY 2CC  Final   Culture   Final    NO GROWTH 2 DAYS Performed at [facility]    Report Status PENDING  Incomplete  Culture, blood (x 2)     Status: None (Preliminary result)   Collection Time: 02/17/15  7:00 AM  Result Value Ref Range Status   Specimen Description BLOOD RIGHT ARM  Final   Special Requests BOTTLES DRAWN AEROBIC AND ANAEROBIC 5CC  Final   Culture   Final    NO GROWTH 2 DAYS Performed at [facility]    Report Status PENDING  Incomplete     Studies: No results found.  Scheduled Meds: . acetaminophen  1,000 mg Oral BID  . antiseptic oral rinse  7 mL Mouth Rinse q12n4p  . aspirin EC  81 mg Oral Daily  . atorvastatin  20 mg Oral QHS  . carvedilol  25 mg Oral BID WC   . chlorhexidine  15 mL Mouth Rinse BID  . dorzolamide-timolol  1 drop Both Eyes QHS  . enoxaparin (LOVENOX) injection  130 mg Subcutaneous Q24H  . fenofibrate  160 mg Oral Daily  . folic acid  1 mg Oral Daily  . hydrALAZINE  37.5 mg Oral BID  . isosorbide mononitrate  30 mg Oral Daily  . latanoprost  1 drop Both Eyes QHS  . multivitamin with minerals  1 tablet Oral Daily  . pantoprazole  40 mg Oral BID AC  . ranolazine  500 mg Oral Daily  . sodium bicarbonate  650 mg Oral BID  . sodium chloride  3 mL Intravenous Q12H  . sucralfate  1 g Oral TID AC & HS  . thiamine  100 mg Oral  Daily   Or  . thiamine IV  100 mg Intravenous Daily  . triamcinolone 0.1 % cream : eucerin  1 application Topical TID  . warfarin  7.5 mg Oral ONCE-1800  . Warfarin - Pharmacist Dosing Inpatient   Does not apply q1800   Continuous Infusions:    Principal Problem:   Nausea & vomiting Active Problems:   DVT (deep venous thrombosis) March 2014   CAD, 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 Last cath 2008 - med Rx. 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 [facility] Pager [phone]. If 7PM-7AM, please contact night-coverage at [email or web address], password [facility] 02/20/2015, 10:54 AM  LOS: 3 days

## 2015-02-20 NOTE — Progress Notes (Signed)
ANTICOAGULATION CONSULT NOTE - follow up  Pharmacy Consult for enoxaparin & warfarin Indication: DVT  Allergies  Allergen Reactions  . Penicillins Other (See Comments)    Childhood allergy.    Patient Measurements: Height: 5\' 8"  (172.7 cm) Weight: 195 lb 5.2 oz (88.6 kg) IBW/kg (Calculated) : 68.4 Heparin Dosing Weight: 86 kg   Vital Signs: Temp: 98.3 F (36.8 C) (06/18 2030) Temp Source: Oral (06/18 2030) BP: 103/61 mmHg (06/18 2030) Pulse Rate: 80 (06/18 2030)  Labs:  Recent Labs  02/18/15 0350 02/18/15 1153 02/18/15 2120 02/19/15 0420 02/20/15 0455  HGB 10.5*  --   --  9.8* 9.6*  HCT 31.3*  --   --  29.1* 28.8*  PLT 74*  --   --  104* 119*  LABPROT 15.7*  --   --  16.2* 19.0*  INR 1.24  --   --  1.29 1.59*  HEPARINUNFRC 0.79* 0.71* 0.61 0.51  --   CREATININE 1.57*  --   --  1.45* 1.56*    Estimated Creatinine Clearance: 49.7 mL/min (by C-G formula based on Cr of 1.56).   Medical History: Past Medical History  Diagnosis Date  . Hypertension   . DVT (deep venous thrombosis) 11/2012    bilateral  . Psoriasis   . Chronic kidney disease     CKD 3  . CAD in native artery 1999    CABG X 5 '99, Myoview low risk 5/12  . Hyperlipidemia   . Alcohol abuse, daily use   . Diastolic dysfunction March 2014    EF 45% by echo  . GERD (gastroesophageal reflux disease)   . Optic nerve ischemia   . Problems with hearing   . Vision problems   . Carotid bruit 03/20/2007    Carotid doppler - R & L bulbs and proximal ICA -  irregular nonhemodynamically significant plaque of 0-49% diameter reduction  . Edema   . Hypoalbuminemia   . Sinus tachycardia   . Hx-TIA (transient ischemic attack)   . Barrett's esophagus 01/29/2013  . Stable angina     Chronic  . Cardiorenal syndrome with renal failure 03/23/2013  . Cardiomyopathy, ischemic - EF 45-50% March 2014 01/19/2013  . Chronic combined systolic and diastolic CHF, NYHA class 2     Medications:  Scheduled:  .  acetaminophen  1,000 mg Oral BID  . antiseptic oral rinse  7 mL Mouth Rinse q12n4p  . aspirin EC  81 mg Oral Daily  . atorvastatin  20 mg Oral QHS  . carvedilol  25 mg Oral BID WC  . chlorhexidine  15 mL Mouth Rinse BID  . dorzolamide-timolol  1 drop Both Eyes QHS  . enoxaparin (LOVENOX) injection  90 mg Subcutaneous Q12H  . fenofibrate  160 mg Oral Daily  . folic acid  1 mg Oral Daily  . hydrALAZINE  37.5 mg Oral BID  . isosorbide mononitrate  30 mg Oral Daily  . latanoprost  1 drop Both Eyes QHS  . multivitamin with minerals  1 tablet Oral Daily  . pantoprazole  40 mg Oral BID AC  . ranolazine  500 mg Oral Daily  . sodium bicarbonate  650 mg Oral BID  . sodium chloride  3 mL Intravenous Q12H  . sucralfate  1 g Oral TID AC & HS  . thiamine  100 mg Oral Daily   Or  . thiamine IV  100 mg Intravenous Daily  . triamcinolone 0.1 % cream : eucerin  1 application Topical TID  .  Warfarin - Pharmacist Dosing Inpatient   Does not apply q1800   Infusions:     Assessment:  69 yr male with h/o bilateral DVT (2014) on warfarin 5mg  daily PTA.  Last dose noted as taken on 6/13.  Patient also with RX on med rec for Vitamin K 5mg  PO daily with last dose taken sometime this past week.  INR upon admission = 0.99 (Goal 2-3)  Pharmacy consulted to dose warfarin for treatment of past DVT as well as bridge with IV heparin   Today, 02/20/2015:  INR = 1.59 - trending up with warfarin 7.5mg  x 3 doses.  INR lagging behind Protime  CBC: Hgb dec/trending down but appears to have stabilized, pltc = 119 (improving) - suspect d/t EtOH abuse  Renal: SCr = 1.56 w/ CrCl > 30 ml/min  Diet:soft - intake improved per documentation  Drug interactions: no significant interactions   Per RN note there is concern if patient/wife will be able to administer enoxaparin  Goal of Therapy:  INR 2-3 LMWH anti-Xa level 0.6-1 units/ml for q12h dosing Monitor platelets by anticoagulation protocol: Yes  INR 2-3    Plan:   Due to possible concern of compliance (and with platelet count improved), will change enoxaparin to 130mg  (1.5mg /kg) SQ q24h in hopes for better compliance.                           Warfarin 7.5mg  po x 1 tonight (increase dose 6/19 if INR not trending up)  IF discharged home today, suggest warfarin 7.5mg  daily x 2 days with INR follow-up on Tuesday 6/21 at latest.   Follow INR daily, CBC at least q72h while hospitalized  Monitor for bleeding, Hgb trending down  Doreene Eland, PharmD, BCPS.   Pager: [phone] 02/20/2015 8:11 AM

## 2015-02-21 DIAGNOSIS — F1020 Alcohol dependence, uncomplicated: Secondary | ICD-10-CM

## 2015-02-21 LAB — BASIC METABOLIC PANEL
ANION GAP: 4 — AB (ref 5–15)
BUN: 13 mg/dL (ref 6–20)
CALCIUM: 9 mg/dL (ref 8.9–10.3)
CHLORIDE: 104 mmol/L (ref 101–111)
CO2: 24 mmol/L (ref 22–32)
Creatinine, Ser: 1.48 mg/dL — ABNORMAL HIGH (ref 0.61–1.24)
GFR calc Af Amer: 55 mL/min — ABNORMAL LOW (ref >60)
GFR calc non Af Amer: 47 mL/min — ABNORMAL LOW (ref >60)
Glucose, Bld: 94 mg/dL (ref 65–99)
POTASSIUM: 4 mmol/L (ref 3.5–5.1)
Sodium: 132 mmol/L — ABNORMAL LOW (ref 135–145)

## 2015-02-21 LAB — PROTIME-INR
INR: 1.98 — ABNORMAL HIGH (ref 0.00–1.49)
Prothrombin Time: 22.4 seconds — ABNORMAL HIGH (ref 11.6–15.2)

## 2015-02-21 LAB — GLUCOSE, CAPILLARY: Glucose-Capillary: 92 mg/dL (ref 65–99)

## 2015-02-21 MED ORDER — DIPHENHYDRAMINE HCL 50 MG PO CAPS
50.0000 mg | ORAL_CAPSULE | Freq: Once | ORAL | Status: AC
  Administered 2015-02-21: 50 mg via ORAL
  Filled 2015-02-21: qty 1

## 2015-02-21 MED ORDER — MENTHOL 3 MG MT LOZG
1.0000 | LOZENGE | OROMUCOSAL | Status: DC | PRN
  Filled 2015-02-21: qty 9

## 2015-02-21 MED ORDER — PANTOPRAZOLE SODIUM 40 MG PO TBEC: 40.0000 mg | DELAYED_RELEASE_TABLET | Freq: Two times a day (BID) | ORAL | Status: AC

## 2015-02-21 MED ORDER — ACETAMINOPHEN 500 MG PO TABS: 1000.0000 mg | ORAL_TABLET | Freq: Two times a day (BID) | ORAL | Status: AC

## 2015-02-21 NOTE — Progress Notes (Signed)
CSW received referral for current substance abuse.   Pt discharged before CSW able to assess pt and time did not permit for weekend CSW to assess.   CSW signing off.   Alison Murray, MSW, [place] [facility] [phone]

## 2015-02-21 NOTE — Discharge Summary (Signed)
Physician Discharge Summary  AMIERE Williams YSA:[id number] DOB: 01-14-1946 DOA: 02/16/2015  PCP: Maggie Font, MD  Admit date: 02/16/2015 Discharge date: 02/21/2015  Time spent: 65 minutes  Recommendations for Outpatient Follow-up:  1. Follow-up with HILL,[name], MD in 1 week. On follow-up patient will need a basic metabolic profile done to follow-up on electrolytes and renal function. Patient need a CBC done to follow-up on his hemoglobin. 2. Patient will need a PT/INR drawn by home health nurse on 02/23/2015 the results sent PCP for further management of his Coumadin. 3. 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 Last cath 2008 - med Rx. 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 male with PMH of hypertension, hyperlipidemia, GERD, CAD (post status of CABG), history of DVT (noncompliant to Coumadin), chronic kidney disease-status 3, alcohol abuse, combined systolic and diastolic congestive heart failure (EF of 45-50% with grade 1 diastolic dysfunction),  history of TIA, who presented with nausea, vomiting and abdominal pain.  He reported that he has been having nausea, vomiting and abdominal pain for 5 days. Initially, started with hiccough and then developed abdominal pain. His abdominal pain was located in epigastric area. No diarrhea. He stated that nausea and vomiting have been getting worse. He tried gagging himself in order to throw up, because he stated that was the only thing that mde him feel better. No hematochezia or hematemesis. He also stated he hadn't been able to drink alcohol in 3-4 days because of the vomiting. He stated he normally drinks daily, large amounts of alcohol every day. No chest pain and cough, but had mild shortness of breath. no symptoms of a UTI.  In ED, patient was found to have lactate 2.42, INR 0.99, negative troponin, negative urinalysis, tachycardia, lipase 28, abnormal liver function with ALP 184, AST 143, ALT 79, total bilirubin 2.0, potassium 3.3, stable renal function. Chest x-ray was negative for acute abnormalities.    Hospital Course:  #1 nausea and vomiting/abdominal pain/probable gastritis Likely secondary to gastritis likely alcohol induced. Patient was initially admitted to the step down unit and monitored. Patient was placed on gentle hydration. Hemoglobin was followed. FOBT is negative. Hemoglobin has trended down likely dilutional effect and remained stable. Patient was placed on PPI twice a day, Carafate. Patient improved clinically did not have any further nausea or vomiting. Patient was started on a clear liquid diet and advanced to a soft diet which she tolerated. Patient was also maintained on  antiemetics. Patient was also placed on supportive care and alcohol cessation was stressed. Patient improved clinically and was discharged in stable and improved condition.  #2 history of DVT March 2014 Prior to admission patient was supposed to be on Coumadin however INR was subtherapeutic on admission.  Patient did state on admission that was unable to take his oral medications for a few days prior to admission due to nausea and vomiting. On patient's medication reconsultation it was noted that patient was also on daily vitamin K which was discontinued at discharge. Patient was placed on a Lovenox bridge with Coumadin during the hospitalization patient's INR improved such that by day of discharge his INR was 1.98. Patient will follow-up with PCP as outpatient.  #3 coronary artery disease status post CABG 5 1999 Last cath was in 2008 which recommended medical treatment. Patient had a negative Myoview 5/ 2012. Patient remained asymptomatic throughout the hospitalization. Patient was maintained on aspirin, Lipitor, Coreg,imdur,ranexa.  #4 alcohol abuse Alcohol cessation. Patient was placed on the Ativan withdrawal protocol throughout the hospitalization. She was also maintained on thiamine, folic acid, multivitamin.  #5 hypertension Stable. Patient's diuretics were held as patient was noted to be dehydrated on admission. Patient was maintained on home regimen of Coreg, hydralazine,imdur.  #6 chronic kidney disease stage III Some improvement with creatinine during the hospitalization such that by day of discharge his creatinine was down to 1.48. Remain stable. Outpatient follow-up.   #7 elevated lactic acid Likely secondary to dehydration improved with hydration. Blood cultures were obtained however no growth during the hospitalization. Chest x-ray negative for any acute abnormalities. Urinalysis was negative. Follow.  #8 ?? Lockjaw per patient/?? TMJ Discussed case with dentist and was recommended that patient place some warm compresses to the right jaw and patient was started on some scheduled Tylenol at a lower dose. Unable to use NSAIDs due to current management of gastritis. Patient improved clinically and will need to follow-up with her dentist as outpatient.  #9 thrombocytopenia Likely  secondary to chronic alcohol use. No overt bleeding. Improved during the hospitalization . Follow.  #10 hypokalemia Repleted.  #11 anemia Likely dilutional component. FOBT was negative. No overt bleeding. Hemoglobin stablized at 9.6.   Procedures:  Chest x-ray 02/16/2015  Consultations:  None  Discharge Exam: Filed Vitals:   02/21/15 0447  BP: 119/74  Pulse: 76  Temp: 98.3 F (36.8 C)  Resp: 18    General: NAD Cardiovascular: RRR Respiratory: CTAB  Discharge Instructions   Discharge Instructions    Diet - low sodium heart healthy    Complete by:  As directed      Discharge instructions    Complete by:  As directed   Follow up with HILL,[name], MD in 1 week. Stop drinking. Warm compressors to right jaw 3-4 times a day.     Increase activity slowly    Complete by:  As directed           Current Discharge Medication List    START taking these medications   Details  acetaminophen (TYLENOL) 500 MG tablet Take 2 tablets (1,000 mg total) by mouth 2 (two) times daily. Use for 4 days, then  as needed. Qty: 30 tablet, Refills: 0    pantoprazole (PROTONIX) 40 MG tablet Take 1 tablet (40 mg total) by mouth 2 (two) times daily before a meal. Qty: 90 tablet, Refills: 0      CONTINUE these medications which have NOT CHANGED   Details  aspirin EC 81  MG tablet Take 81 mg by mouth daily.    atorvastatin (LIPITOR) 20 MG tablet Take 20 mg by mouth at bedtime.    carvedilol (COREG) 25 MG tablet Take 1 tablet (25 mg total) by mouth 2 (two) times daily with a meal. Qty: 62 tablet, Refills: 0    dorzolamide-timolol (COSOPT) 22.3-6.8 MG/ML ophthalmic solution Place 1 drop into both eyes at bedtime.    fenofibrate 160 MG tablet Take 160 mg by mouth daily.    folic acid (FOLVITE) 1 MG tablet Take 1 tablet (1 mg total) by mouth daily. Qty: 30 tablet, Refills: 5    furosemide (LASIX) 20 MG tablet Take 3-4 tablets (60-80 mg total) by mouth 2 (two) times daily. Qty: 240  tablet, Refills: 11   Associated Diagnoses: Acute on chronic combined systolic and diastolic heart failure    hydrALAZINE (APRESOLINE) 25 MG tablet Take 1.5 tablets (37.5 mg total) by mouth 2 (two) times daily. Qty: 90 tablet, Refills: 5    hydrocortisone cream 0.5 % Apply 1 application topically 2 (two) times daily.    hydrOXYzine (ATARAX/VISTARIL) 10 MG tablet Take 10 mg by mouth 3 (three) times daily as needed for itching.    isosorbide mononitrate (IMDUR) 30 MG 24 hr tablet Take 1-2 tablets (30-60 mg total) by mouth daily. Qty: 90 tablet, Refills: 5    latanoprost (XALATAN) 0.005 % ophthalmic solution 1 drop at bedtime.    Multiple Vitamin (MULTIVITAMIN WITH MINERALS) TABS Take 1 tablet by mouth daily.    nitroGLYCERIN (NITROSTAT) 0.4 MG SL tablet Place 1 tablet (0.4 mg total) under the tongue every 5 (five) minutes as needed for chest pain (hold for SBP < 110). Qty: 25 tablet, Refills: 4    oxyCODONE (OXY IR/ROXICODONE) 5 MG immediate release tablet Take 1 tablet (5 mg total) by mouth every 4 (four) hours as needed. Qty: 15 tablet, Refills: 0    potassium chloride SA (K-DUR,KLOR-CON) 20 MEQ tablet Take 20 mEq by mouth daily.    ranolazine (RANEXA) 500 MG 12 hr tablet Take 1 tablet (500 mg total) by mouth daily. Qty: 30 tablet, Refills: 6   Associated Diagnoses: Stable angina    thiamine 100 MG tablet Take 1 tablet (100 mg total) by mouth daily. Qty: 30 tablet, Refills: 5    Triamcinolone Acetonide (TRIAMCINOLONE 0.1 % CREAM : EUCERIN) CREA Apply 1 application topically 3 (three) times daily. Qty: 1 each, Refills: 3    warfarin (COUMADIN) 5 MG tablet Take 5 mg by mouth daily.      STOP taking these medications     MEPHYTON 5 MG tablet        Allergies  Allergen Reactions  . Penicillins Other (See Comments)    Childhood allergy.   Follow-up Information    Follow up with [facility].   Why:  Home Health RN, Physical Therapy, Occupational  Therapy, and aide   Contact information:   [address] [place] [address] [phone]       Follow up with [facility], MD. Schedule an appointment as soon as possible for a visit in 1 week.   Specialty:  Family Medicine   Contact information:   [address] [place] [address] [phone]       Follow up On 02/23/2015.   Why:  RN to drawn PT/INR and send to PCP on wednesday      Schedule an appointment as soon as possible for a visit in 1 week  to follow up.   Why:  F/U with your dentist in 1-2 weeks for ?lockjaw       The results of significant diagnostics from this hospitalization (including imaging, microbiology, ancillary and laboratory) are listed below for reference.    Significant Diagnostic Studies: Dg Chest 1 View  02/16/2015   CLINICAL DATA:  Intractable hiccups for 5 days.  EXAM: CHEST  1 VIEW  COMPARISON:  01/16/2013  FINDINGS: The cardiac silhouette, mediastinal and hilar contours are within normal limits and stable. Prominent right hilum is likely a slightly enlarged right pulmonary artery. This is a stable finding. 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 The lungs are clear. No pleural effusion. Numerous healed rib fractures are noted bilaterally.  IMPRESSION: No acute cardiopulmonary findings.   Electronically Signed   By: Marijo Sanes [name]   On: 02/16/2015 22:20    Microbiology: Recent Results (from the past 240 hour(s))  MRSA PCR Screening     Status: None   Collection Time: 02/17/15  4:03 AM  Result Value Ref Range Status   MRSA by PCR NEGATIVE NEGATIVE Final    Comment:        The GeneXpert MRSA Assay (FDA approved for NASAL specimens only), is one component of a comprehensive MRSA colonization surveillance program. It is not intended to diagnose MRSA infection nor to guide or monitor treatment for MRSA infections.   Culture, blood (x 2)     Status: None (Preliminary result)   Collection Time: 02/17/15  6:50 AM   Result Value Ref Range Status   Specimen Description BLOOD LEFT HAND  Final   Special Requests BOTTLES DRAWN AEROBIC ONLY 2CC  Final   Culture   Final    NO GROWTH 3 DAYS Performed at [facility]    Report Status PENDING  Incomplete  Culture, blood (x 2)     Status: None (Preliminary result)   Collection Time: 02/17/15  7:00 AM  Result Value Ref Range Status   Specimen Description BLOOD RIGHT ARM  Final   Special Requests BOTTLES DRAWN AEROBIC AND ANAEROBIC 5CC  Final   Culture   Final    NO GROWTH 3 DAYS Performed at [name]    Report Status PENDING  Incomplete     Labs: Basic Metabolic Panel:  Recent Labs Lab 02/17/15 0655 02/18/15 0350 02/19/15 0420 02/20/15 0455 02/21/15 0440  NA 131* 134* 133* 132* 132*  K 3.5 3.7 4.0 3.7 4.0  CL 99* 106 107 104 104  CO2 24 21* 19* 22 24  GLUCOSE 131* 105* 106* 101* 94  BUN 22* 15 15 13 13   CREATININE 1.80* 1.57* 1.45* 1.56* 1.48*  CALCIUM 9.3 8.9 8.9 8.8* 9.0  MG 1.7  --  1.6* 1.9  --    Liver Function Tests:  Recent Labs Lab 02/16/15 2148 02/17/15 0655 02/18/15 0350  AST 143* 99* 109*  ALT 79* 62 57  ALKPHOS 184* 146* 109  BILITOT 2.0* 1.3* 1.5*  PROT 9.2* 7.5 6.1*  ALBUMIN 4.6 3.6 2.9*    Recent Labs Lab 02/16/15 2148  LIPASE 28    Recent Labs Lab 02/17/15 0156  AMMONIA 30   CBC:  Recent Labs Lab 02/16/15 2148 02/17/15 0655 02/18/15 0350 02/19/15 0420 02/20/15 0455  WBC 8.6 8.0 4.6 3.8* 3.1*  NEUTROABS 5.4  --   --   --   --   HGB 14.3 12.5* 10.5* 9.8* 9.6*  HCT 41.1 37.3* 31.3* 29.1* 28.8*  MCV 84.4 85.6  87.7 87.7 87.3  PLT 86* 76* 74* 104* 119*   Cardiac Enzymes:  Recent Labs Lab 02/16/15 2152  TROPONINI <0.03   BNP: BNP (last 3 results)  Recent Labs  02/17/15 0655  BNP 60.4    ProBNP (last 3 results) No results for input(s): PROBNP in the last 8760 hours.  CBG:  Recent Labs Lab 02/17/15 0735 02/18/15 0820 02/19/15 0732 02/20/15 0728  02/21/15 0732  GLUCAP 109* 84 93 85 92       Signed:  THOMPSON,DANIEL MD [facility] 02/21/2015, 10:57 AM

## 2015-02-22 LAB — CULTURE, BLOOD (ROUTINE X 2)
CULTURE: NO GROWTH
Culture: NO GROWTH

## 2015-02-24 ENCOUNTER — Other Ambulatory Visit: Payer: Self-pay | Admitting: Cardiology

## 2015-02-24 NOTE — Telephone Encounter (Signed)
Rx(s) sent to pharmacy electronically.  

## 2015-03-02 ENCOUNTER — Other Ambulatory Visit: Payer: Self-pay | Admitting: Cardiology

## 2015-03-02 NOTE — Telephone Encounter (Signed)
REFILL 

## 2015-03-08 IMAGING — CR DG CHEST 2V
2 series · 2 of 2 positions shown · non-contrast
Comparison: 07/03/2009

CLINICAL DATA: Cough

CHEST - 2 VIEW

[w chest pa]
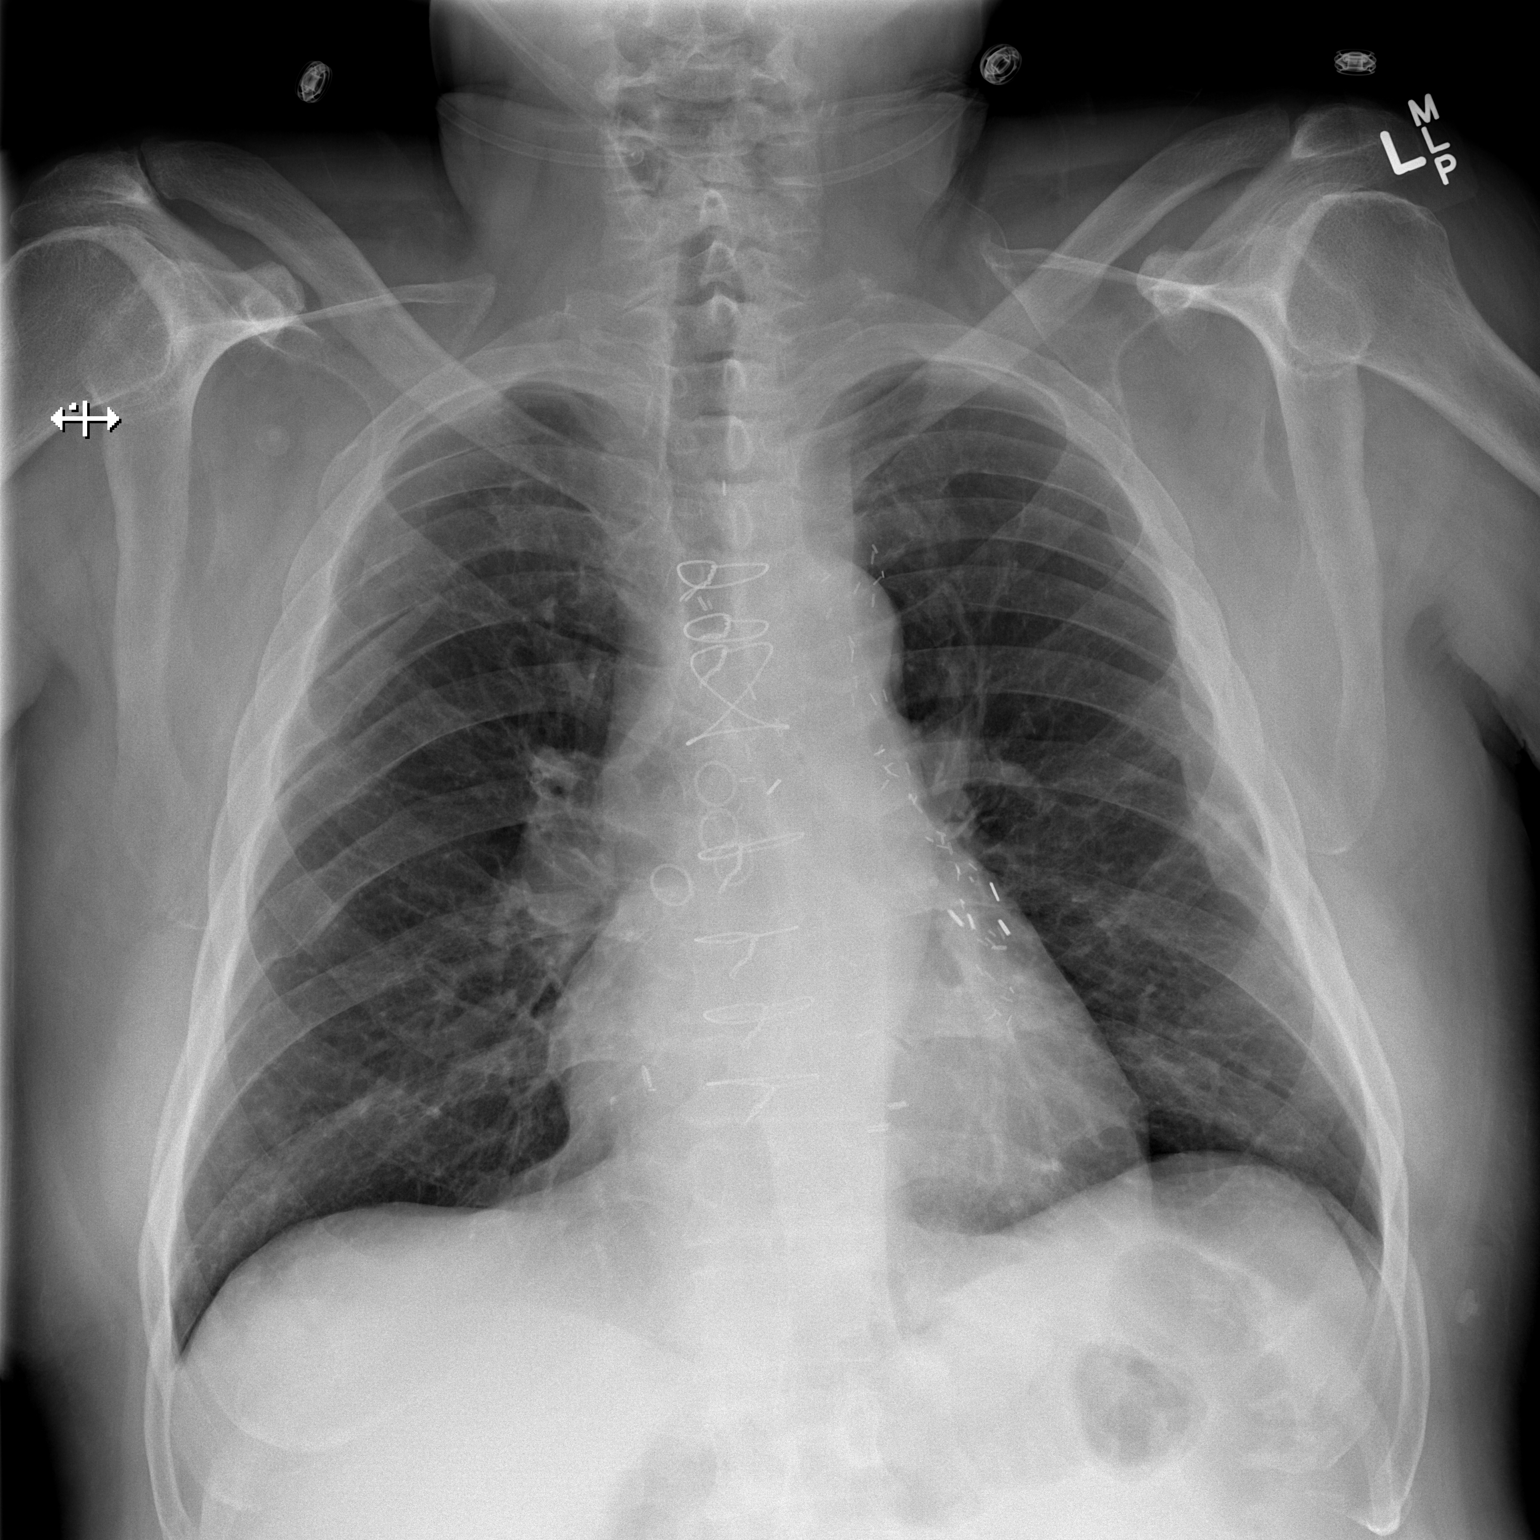

[w chest lat]
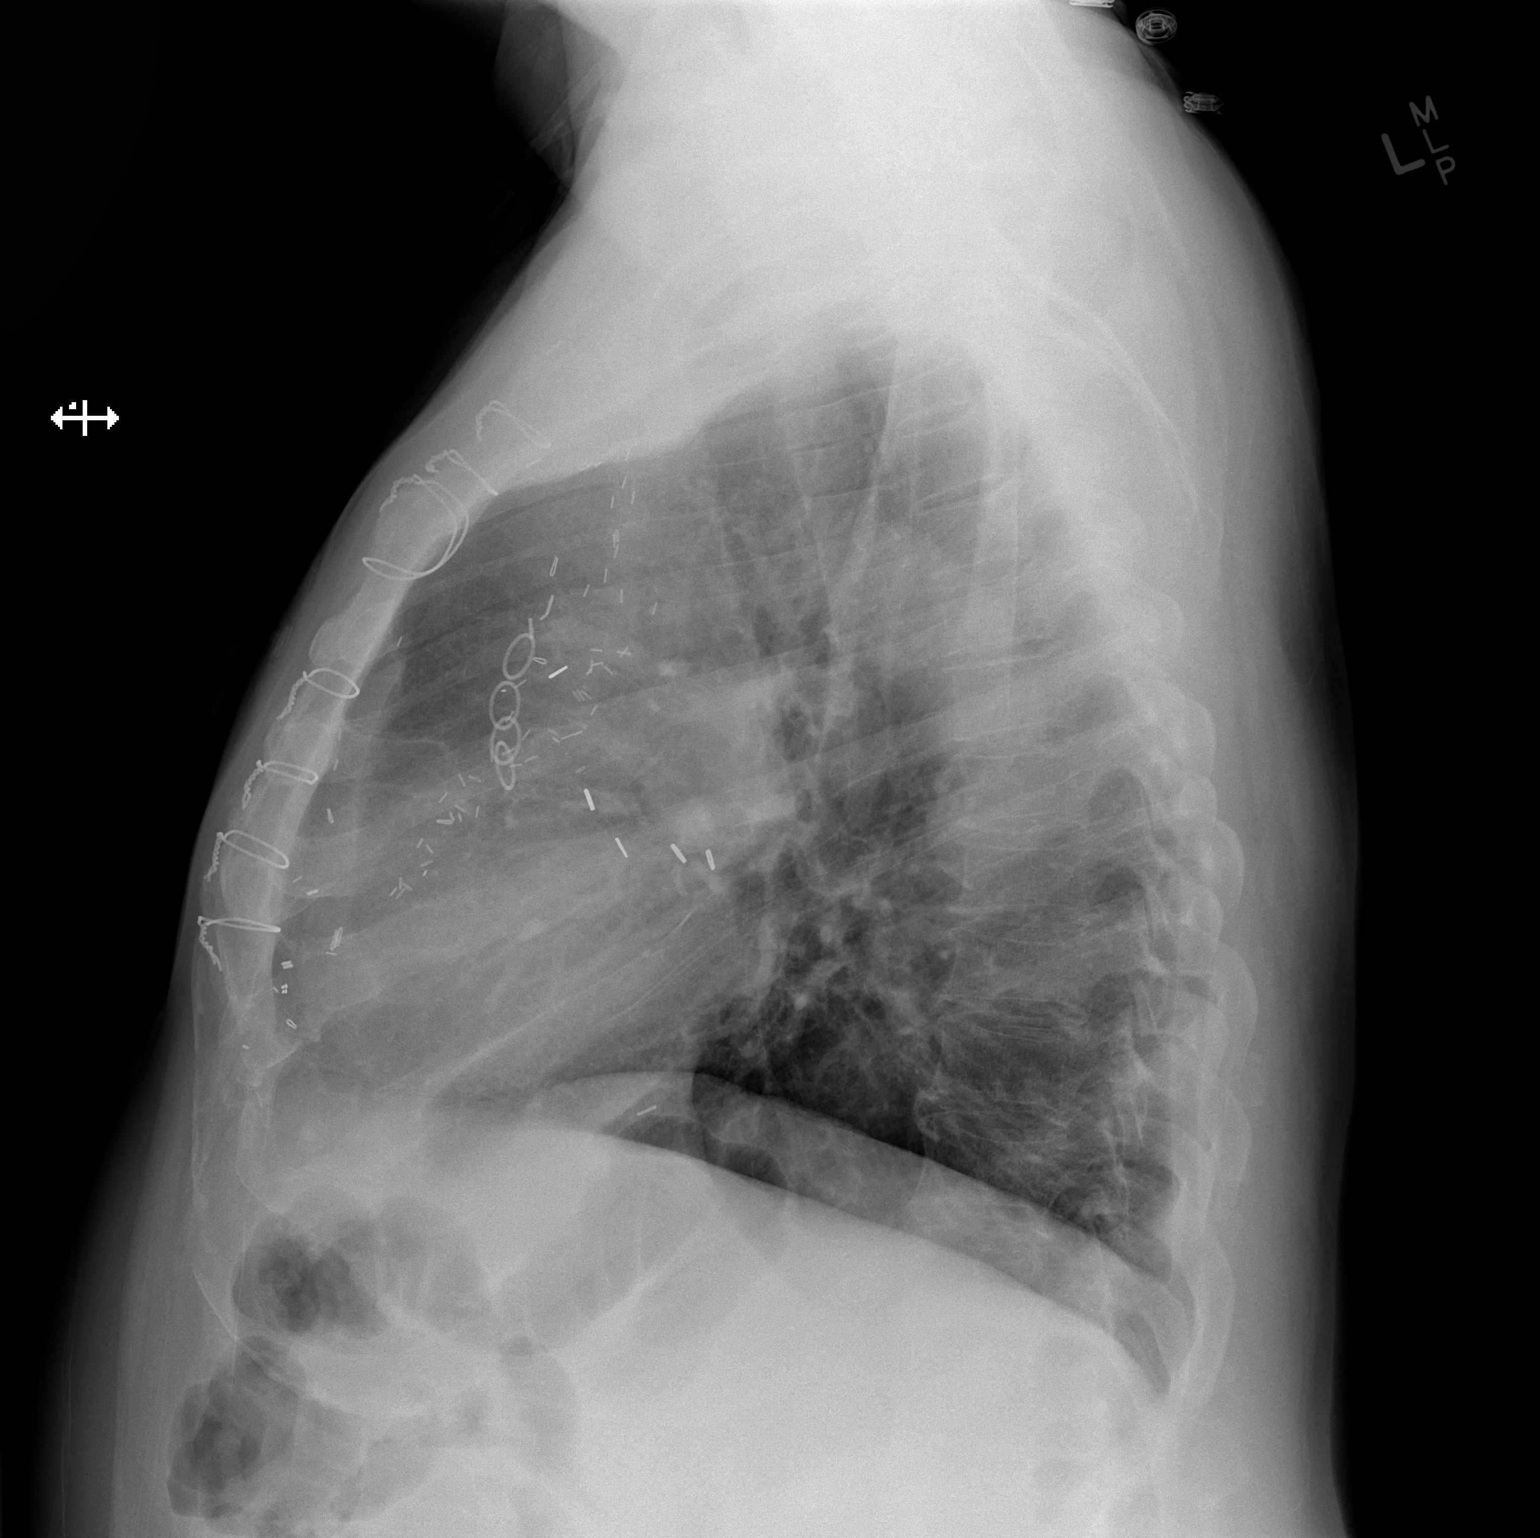

[2 of 2 positions shown; findings below may reference images not displayed]

FINDINGS: Postop CABG.  Negative for heart failure.  Lungs are
clear without infiltrate or effusion.

Multiple healed chronic rib fractures.
IMPRESSION: No acute cardiopulmonary abnormality.

## 2015-03-10 IMAGING — US US ABDOMEN COMPLETE
1 series · 14 of 25 positions shown · non-contrast
Comparison: Renal ultrasound dated 11/14/2004

CLINICAL DATA: Acute renal failure.  All views.

COMPLETE ABDOMINAL ULTRASOUND

[Series 1: us abdomen complete · 0.32mm/px · 14 of 66 slices shown]
[im 1/66]
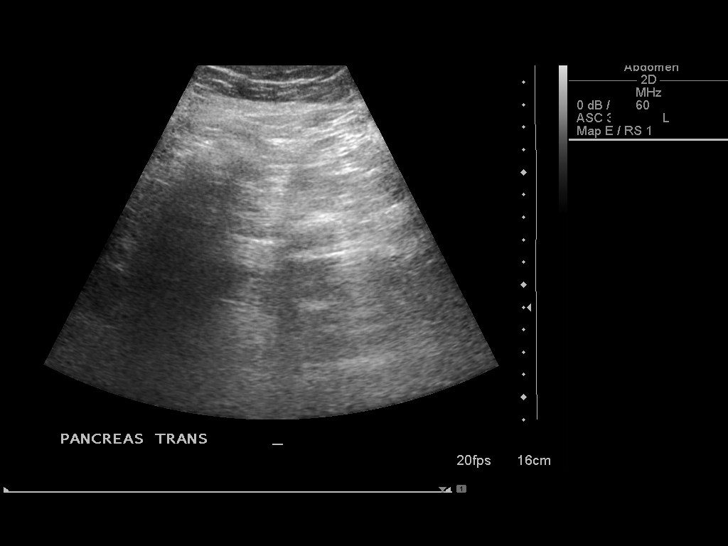
[im 6/66]
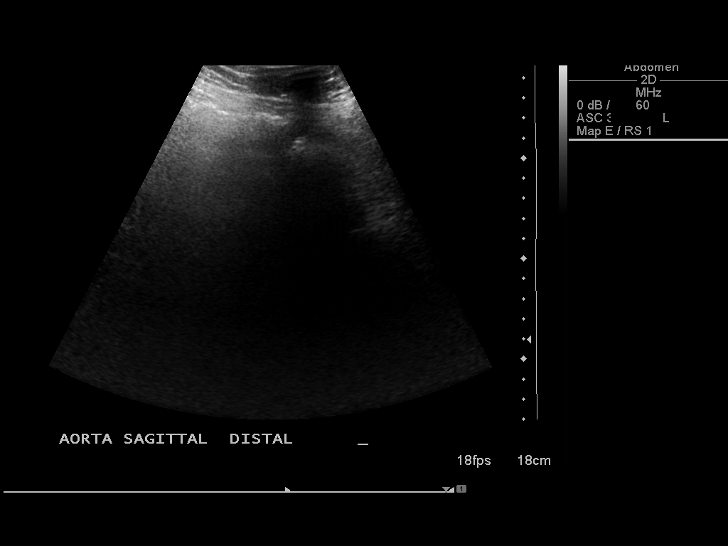
[im 11/66]
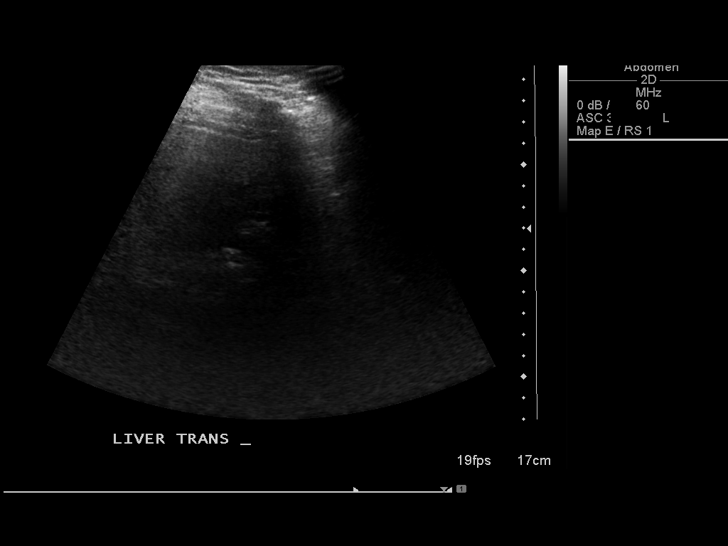
[im 17/66]
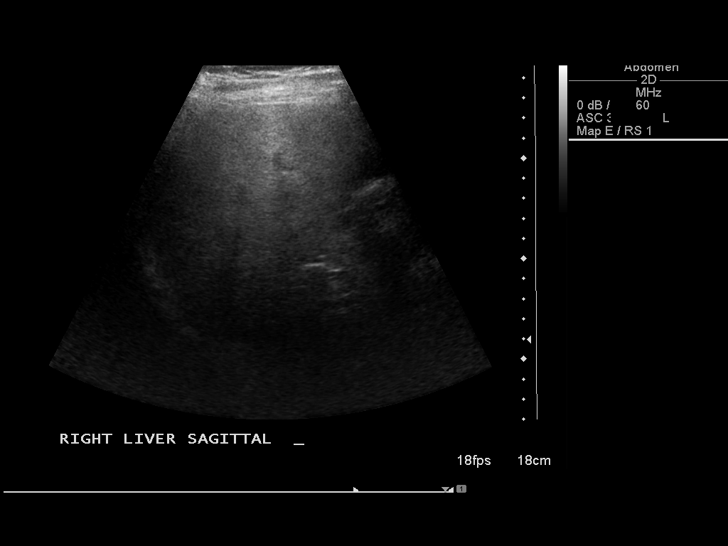
[im 22/66]
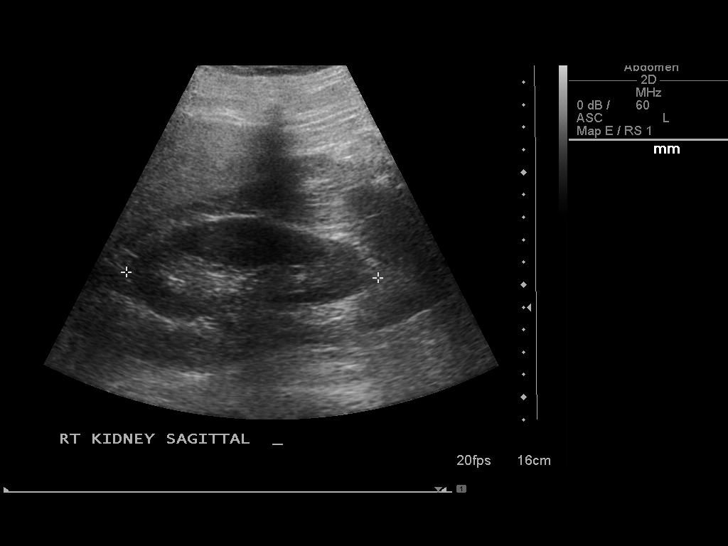
[im 25/66]
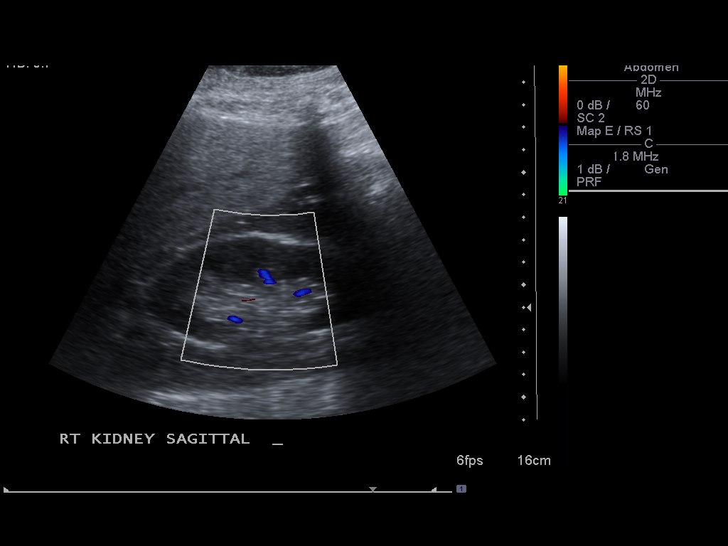
[im 30/66]
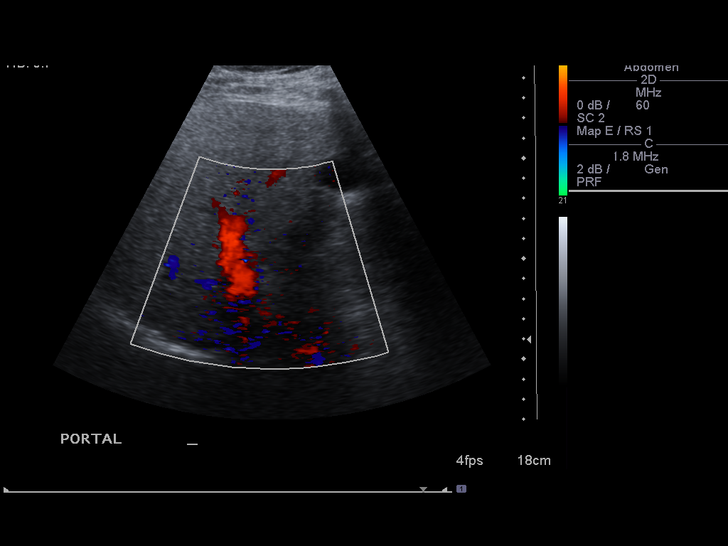
[im 36/66]
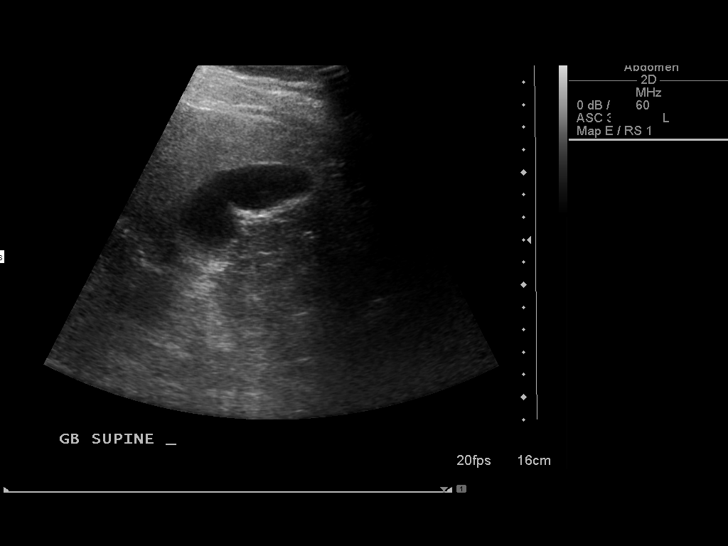
[im 41/66]
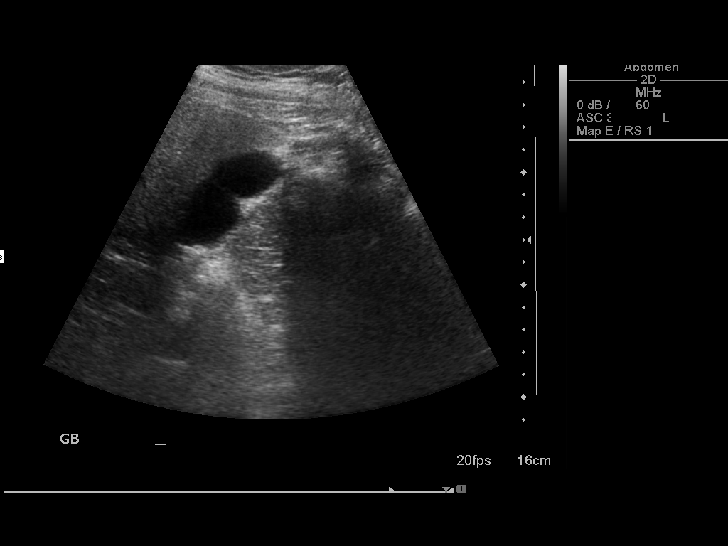
[im 44/66]
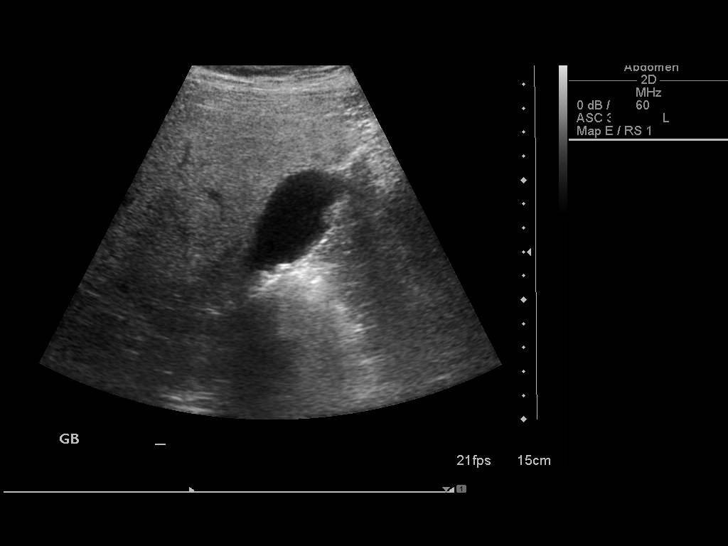
[im 49/66]
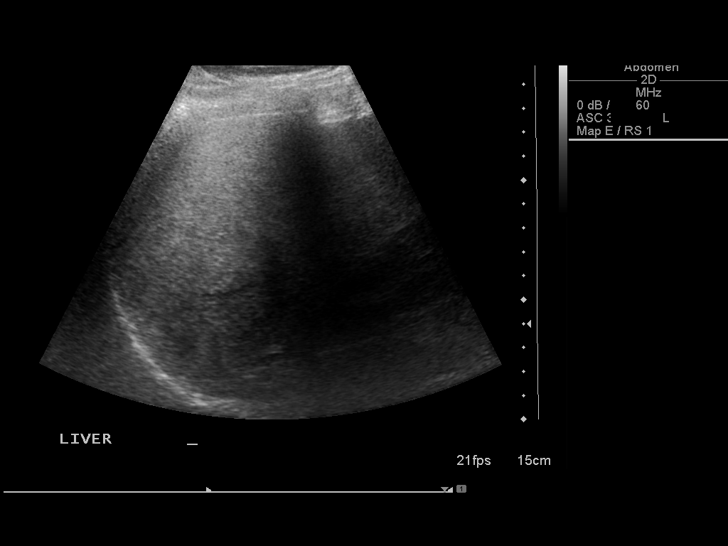
[im 55/66]
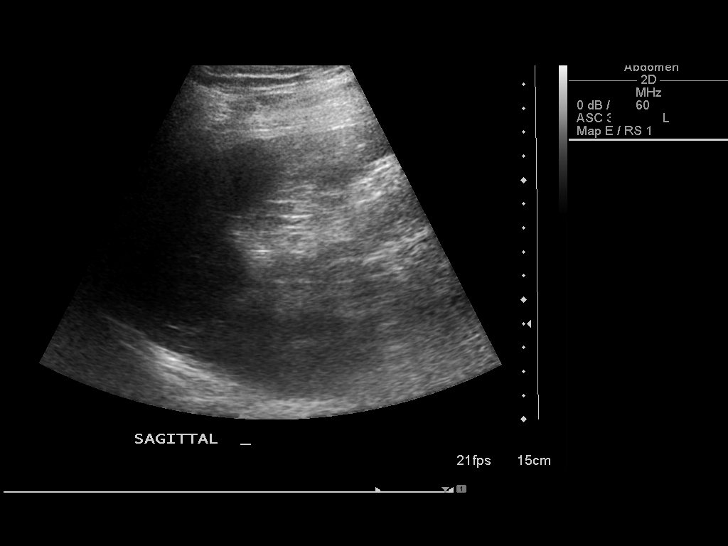
[im 60/66]
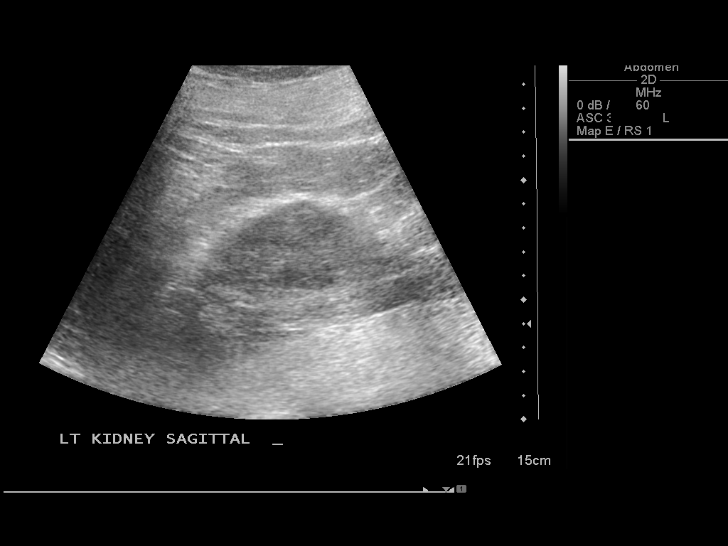
[im 66/66]
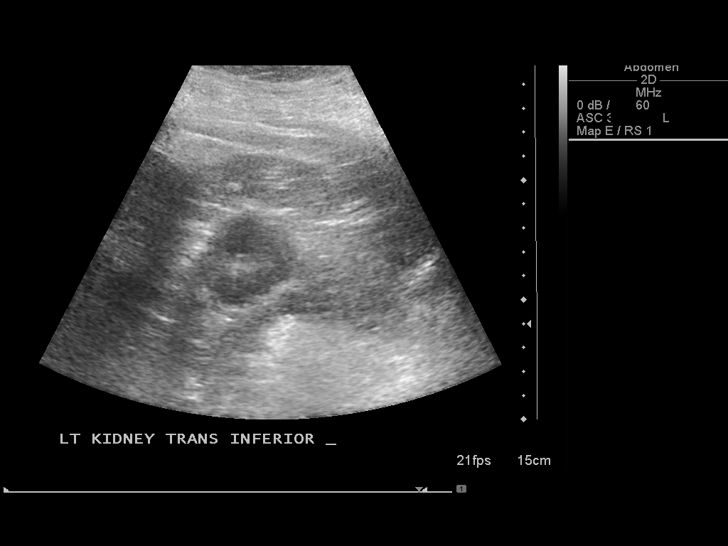

[14 of 25 positions shown; findings below may reference images not displayed]

FINDINGS: Gallbladder:  Multiple gallstones.  No gallbladder wall thickening.
Negative sonographic Murphy's sign.

Common bile duct:  Limited visualization of the common bile duct.
The visualized segment is 6.1 mm in diameter.

Liver:  The liver is diffusely echogenic.  The left lobe is not
well seen.  No mass lesions.

IVC:  Not visualized.

Pancreas:  Not visualized.

Spleen:  Normal.  6.5 cm.

Right Kidney:  Normal.  11.2 cm in length.

Left Kidney:  Normal.  10.8 cm in length.

Abdominal aorta:  A small segment was visualized in the mid abdomen
and measures 2.2 cm in diameter.
IMPRESSION: Cholelithiasis.  Hepatic steatosis.  IVC and pancreas are not
visible.  The kidneys appear normal and unchanged since the prior
exam.

## 2015-03-20 ENCOUNTER — Other Ambulatory Visit: Payer: Self-pay | Admitting: Cardiology

## 2015-03-20 IMAGING — CR DG FOOT 2V*R*
1 series · 2 of 2 positions shown · non-contrast
Comparison: None.

CLINICAL DATA: Right foot pain in the region of the metatarsal
phalangeal joints.  No known injury.  Psoriasis.

RIGHT FOOT - 2 VIEW

[Series 1: AP · right · 2 of 2 slices shown]
[im 1/2]
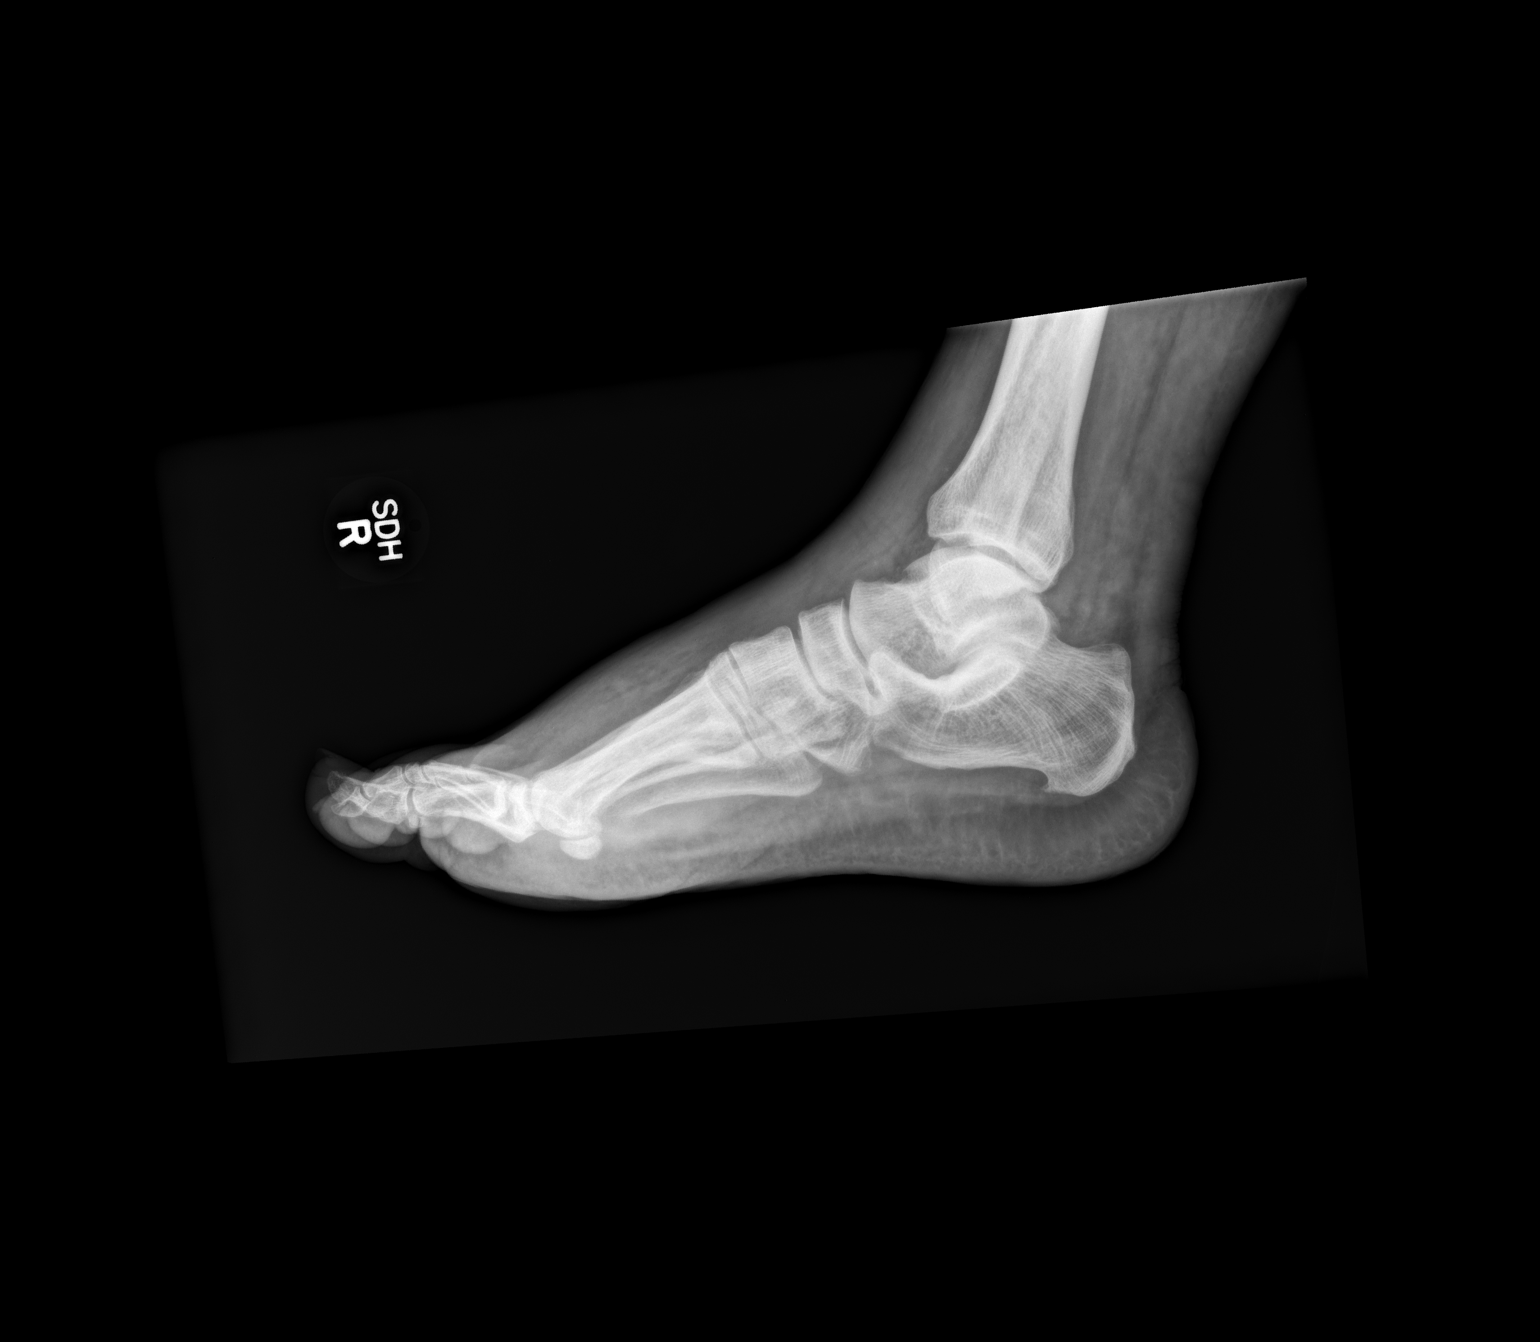
[im 2/2]
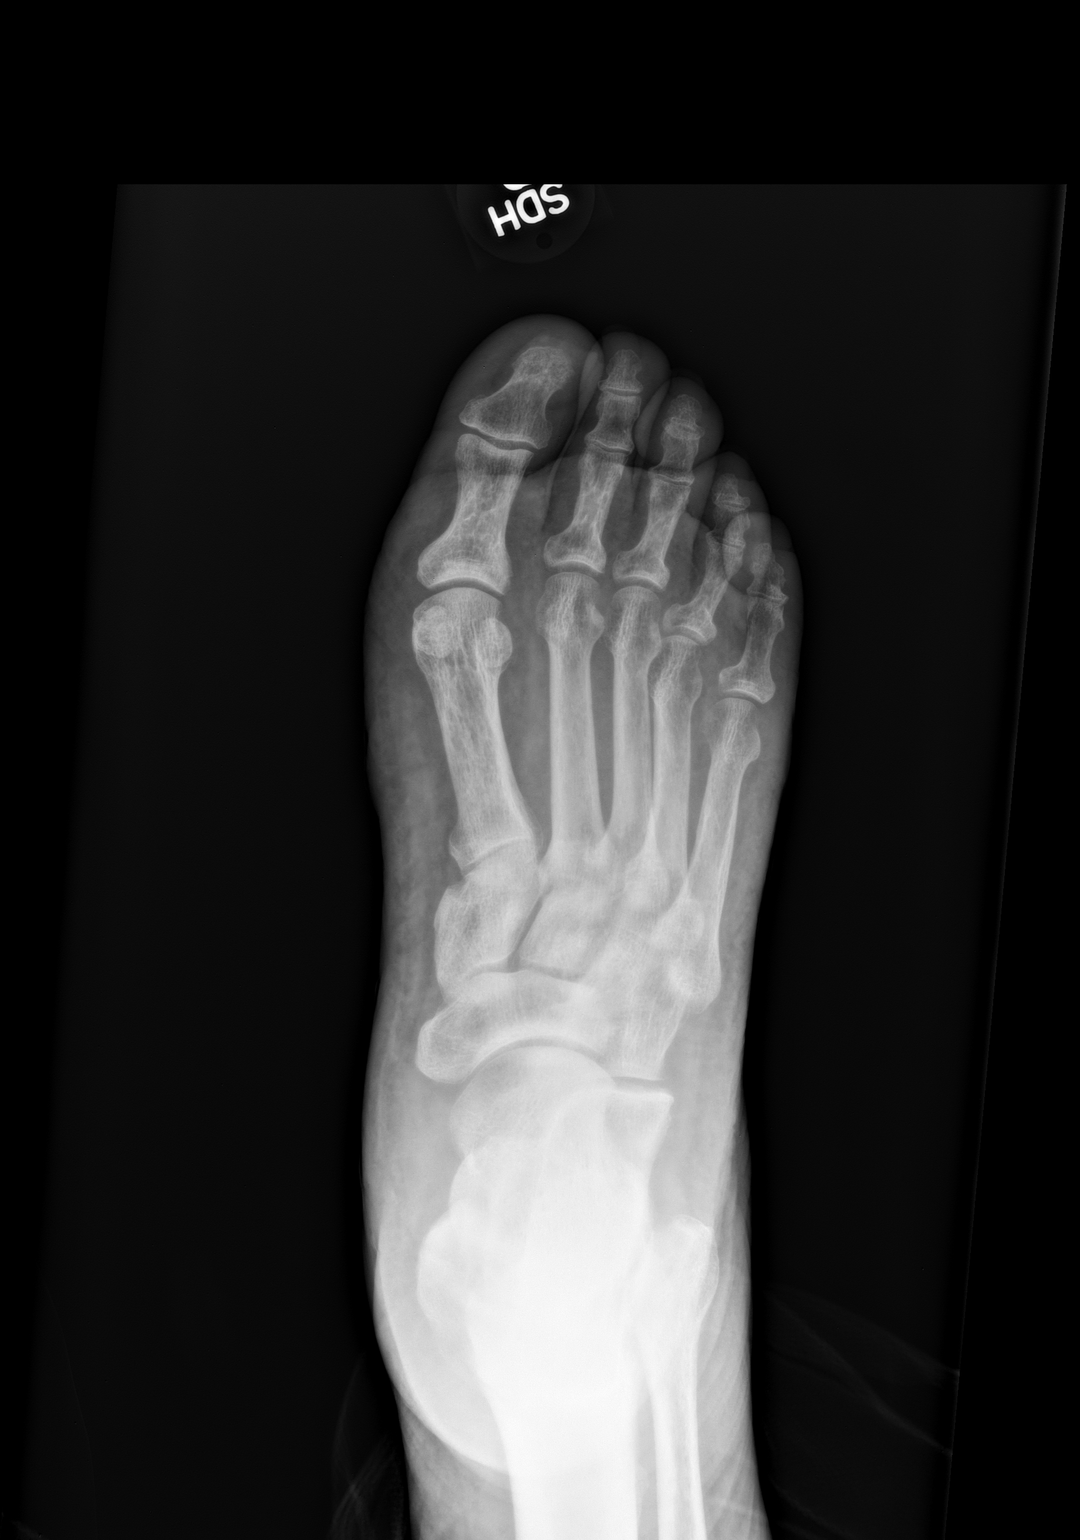

[2 of 2 positions shown; findings below may reference images not displayed]

FINDINGS: Two views of the right foot demonstrate patchy osteopenia
in all of the bones of the foot.  There is also diffuse soft tissue
swelling dorsally, medially, laterally and in the plantar aspect of
the foot distally.  No soft tissue gas, bone erosions or periosteal
reaction.  Mild to moderate inferior calcaneal spur formation.
IMPRESSION: Diffuse soft tissue swelling and patchy osteopenia, as described
above.

## 2015-03-21 ENCOUNTER — Telehealth: Payer: Self-pay | Admitting: Cardiology

## 2015-03-22 NOTE — Telephone Encounter (Signed)
Closed encounter °

## 2015-05-07 IMAGING — CR DG CHEST 1V PORT
1 series · 1 of 1 positions shown · non-contrast
Comparison: 11/29/2012

CLINICAL DATA: Shortness of breath and swelling.  History
hypertension.

PORTABLE CHEST - 1 VIEW

[AP]
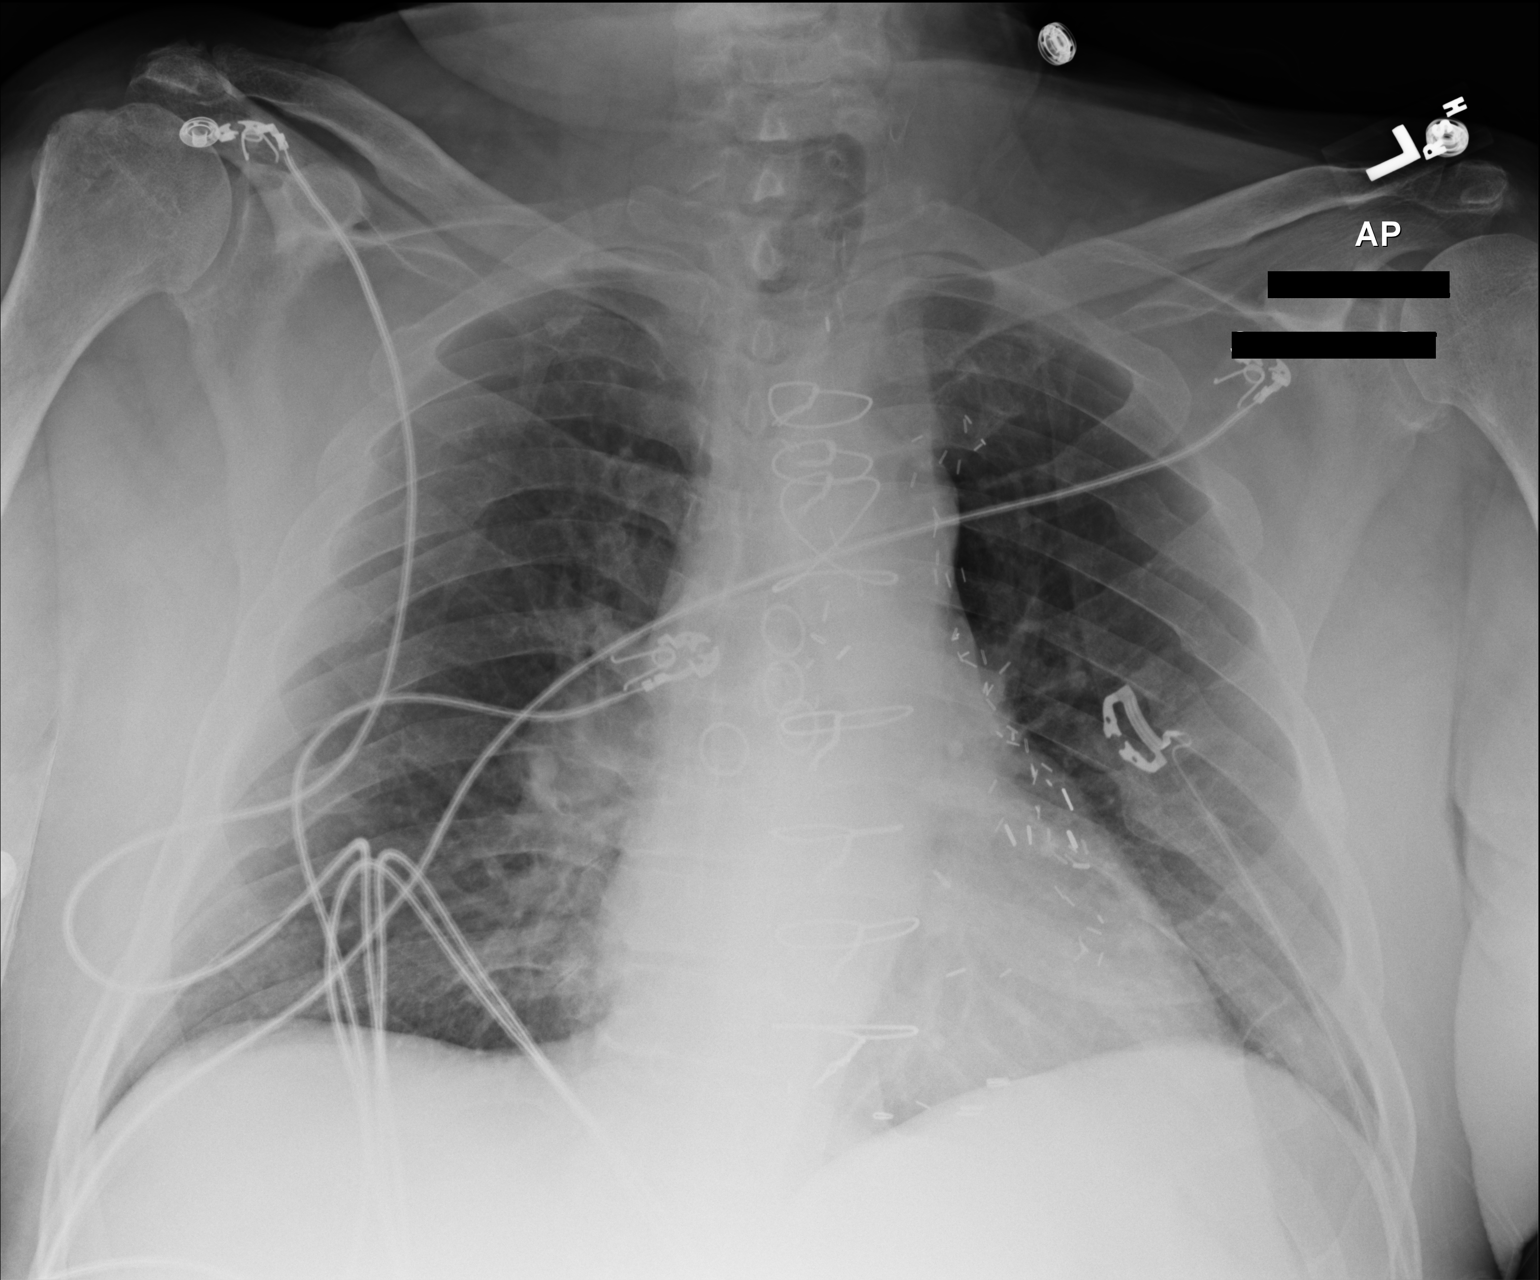

[1 of 1 positions shown; findings below may reference images not displayed]

FINDINGS: Prior median sternotomy.  Patient minimally rotated left.
Numerous leads and wires project over the chest.  Remote bilateral
rib fractures.

Borderline cardiomegaly.     No pleural effusion or pneumothorax.
No congestive failure.

Clear lungs.
IMPRESSION: Borderline cardiomegaly, without acute disease.

## 2015-05-09 IMAGING — US US ABDOMEN COMPLETE
1 series · 14 of 25 positions shown · non-contrast
Comparison: None.

CLINICAL DATA: Elevated transaminases.  Nausea.

ABDOMINAL ULTRASOUND COMPLETE

[Series 1: us abdomen complete · 0.31mm/px · 14 of 51 slices shown]
[im 1/51]
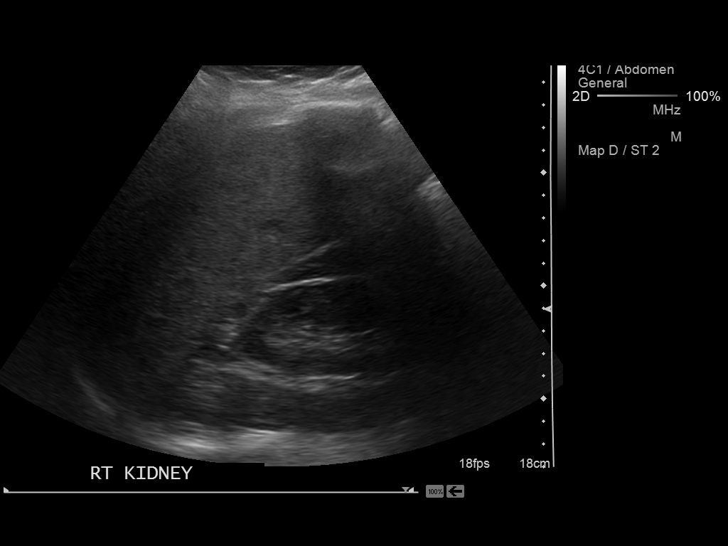
[im 5/51]
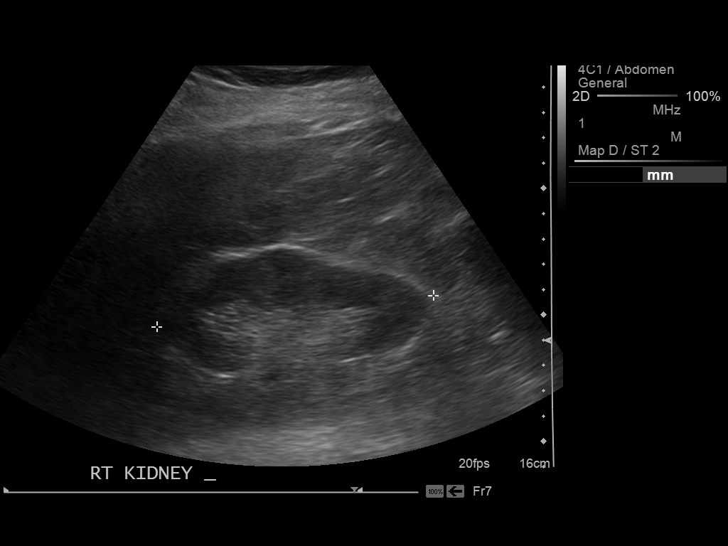
[im 9/51]
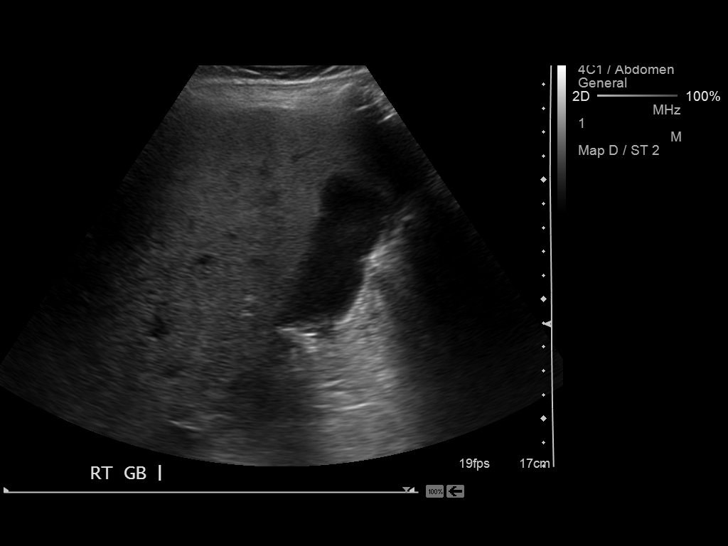
[im 13/51]
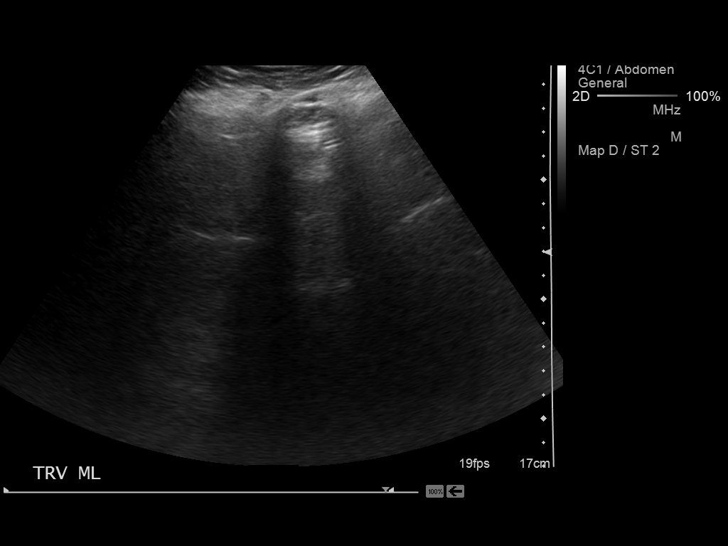
[im 17/51]
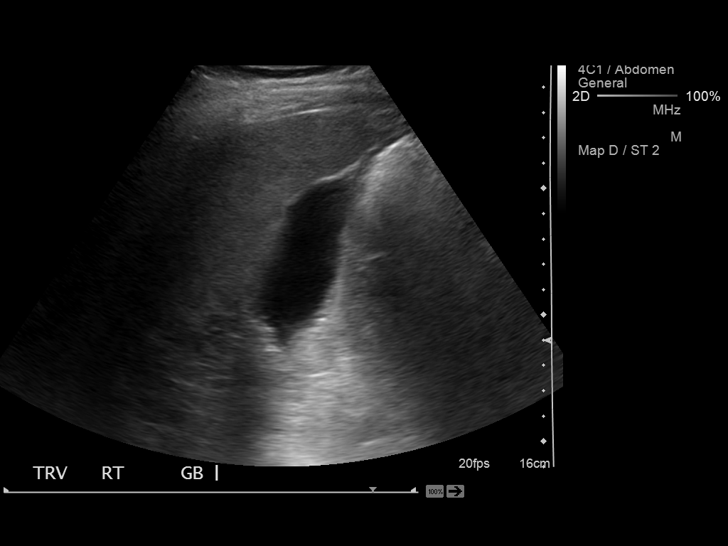
[im 19/51]
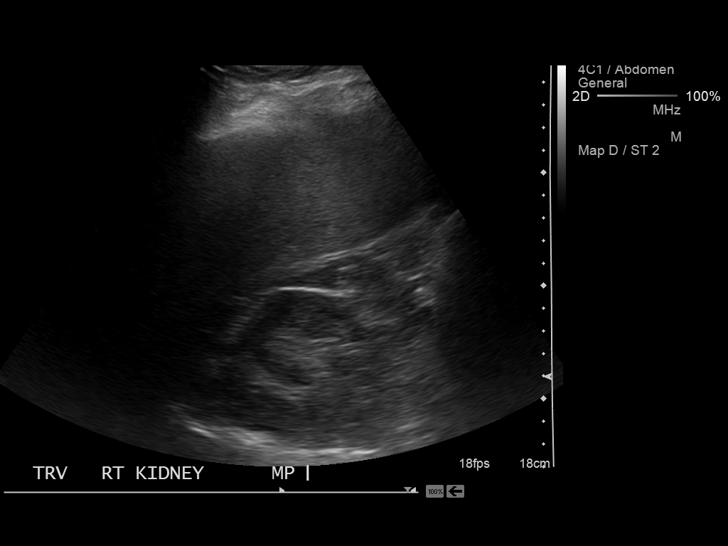
[im 23/51]
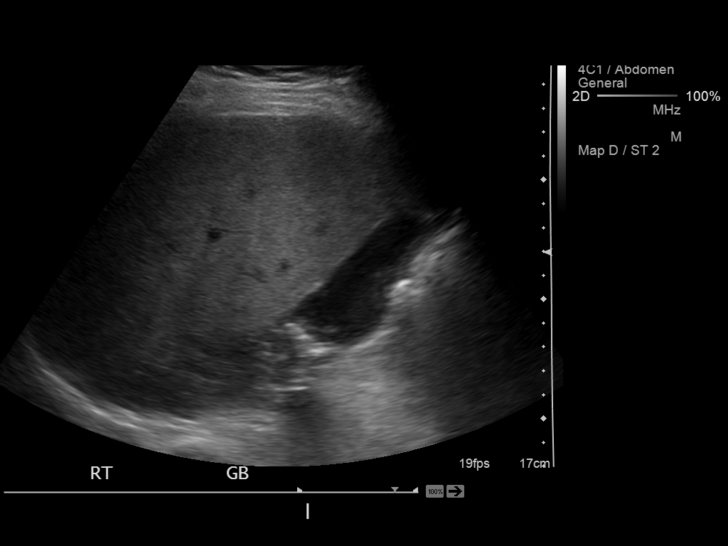
[im 28/51]
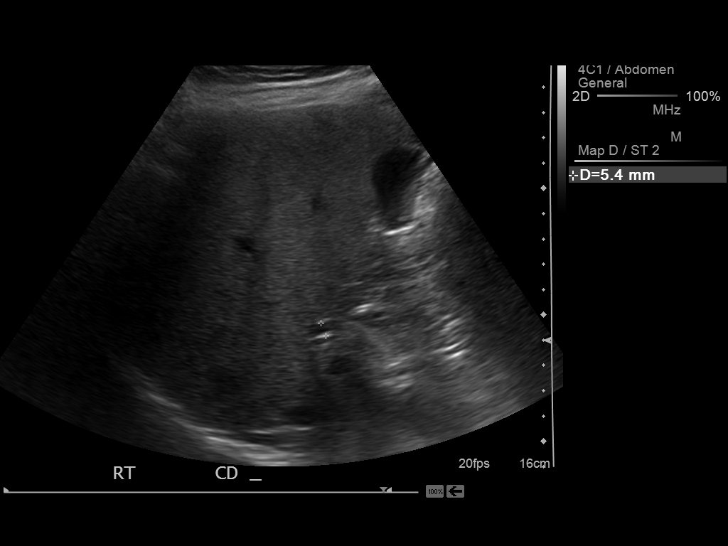
[im 32/51]
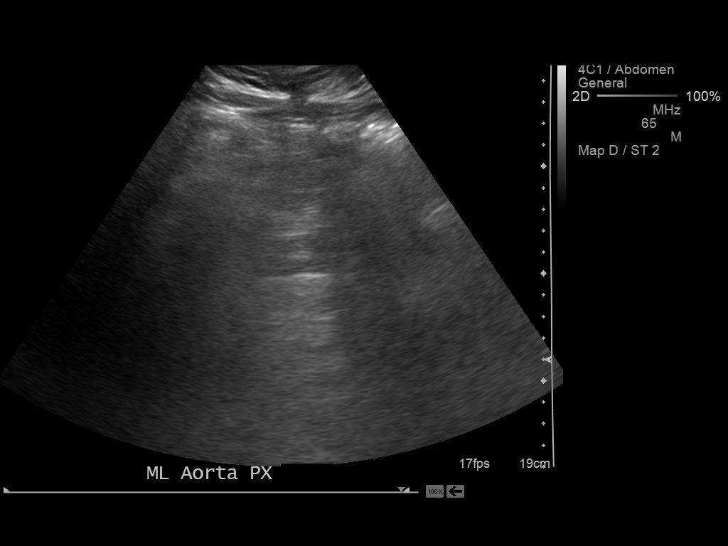
[im 34/51]
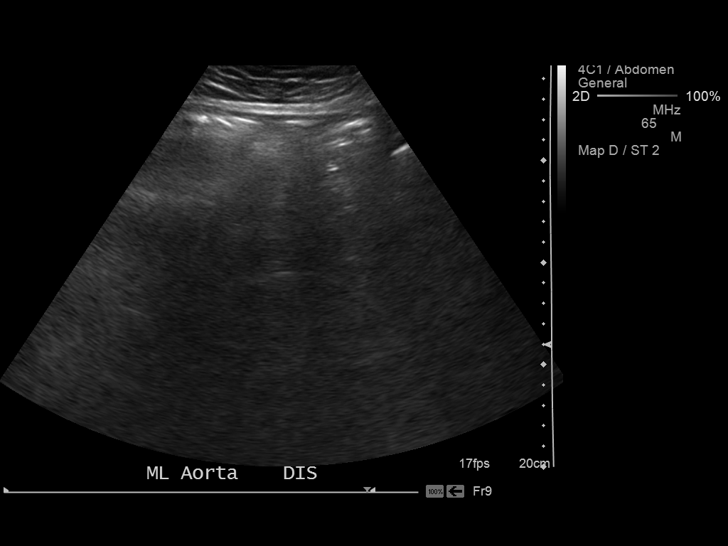
[im 38/51]
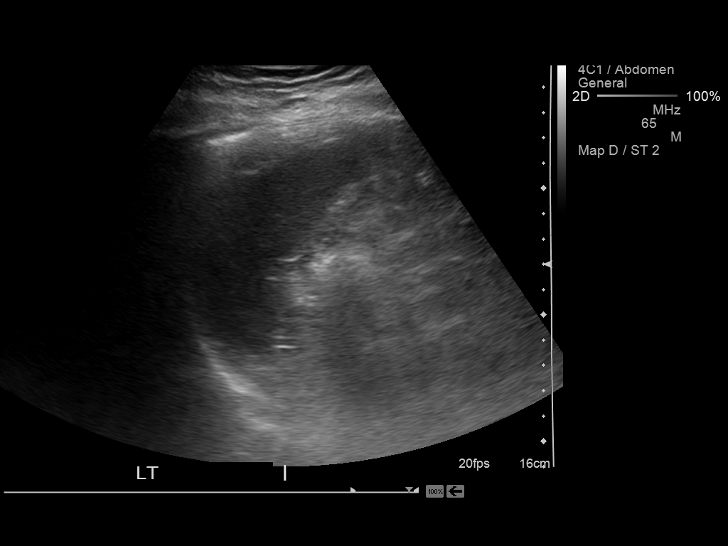
[im 42/51]
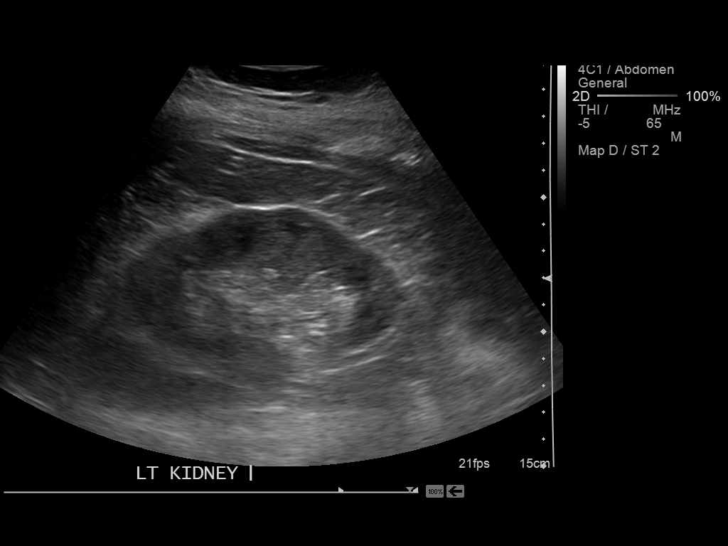
[im 46/51]
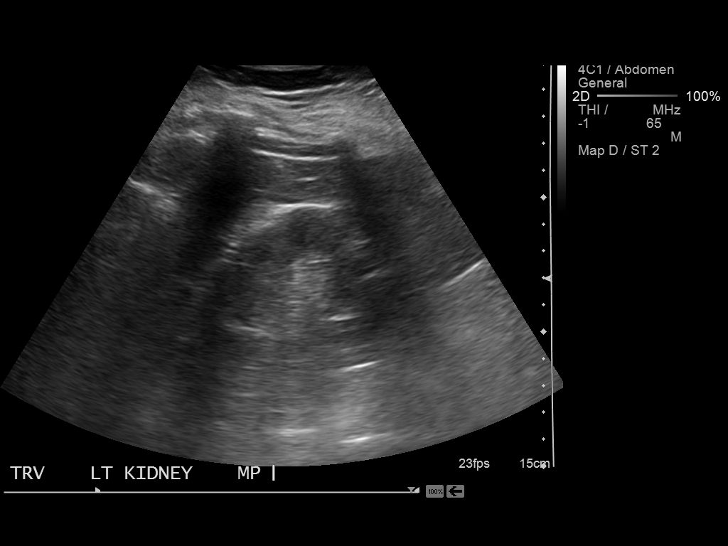
[im 51/51]
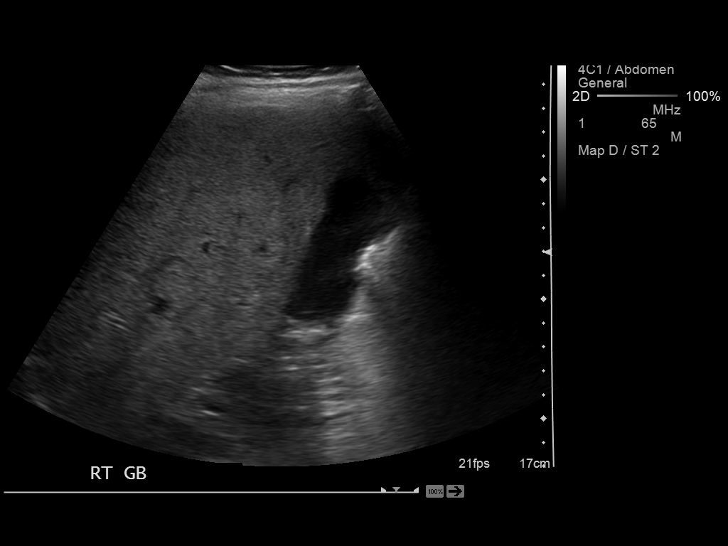

[14 of 25 positions shown; findings below may reference images not displayed]

FINDINGS: Gallbladder:  Gallstones are seen towards the neck of the
gallbladder.  The largest is 1.4 cm.  No Murphy's sign or
pericholecystic fluid.

Common Bile Duct:  Within normal limits in caliber.

Liver: The liver is diffusely heterogeneous without focal mass.The
left lobe is obscured by overlying bowel gas.

IVC:  Suboptimally visualized.  Grossly within normal limits.

Pancreas:  Obscured by overlying bowel gas.

Spleen:  Within normal limits in size and echotexture.

Right kidney:  Normal in size and parenchymal echogenicity.  No
evidence of mass or hydronephrosis.

Left kidney:  Normal in size and parenchymal echogenicity.  No
evidence of mass or hydronephrosis.

Abdominal Aorta:  Obscured by overlying bowel gas.
IMPRESSION: Limited visualization of the pancreas, aorta, and left lobe of the
liver.

Cholelithiasis.

The liver is diffusely heterogeneous compatible with diffuse
hepatic parenchymal disease.

## 2015-05-16 ENCOUNTER — Encounter: Payer: Medicare (Managed Care) | Admitting: Cardiology

## 2015-05-22 NOTE — Progress Notes (Signed)
This encounter was created in error - please disregard.

## 2015-08-09 ENCOUNTER — Ambulatory Visit: Payer: Medicare Other | Admitting: Cardiology

## 2015-08-09 DIAGNOSIS — R0989 Other specified symptoms and signs involving the circulatory and respiratory systems: Secondary | ICD-10-CM

## 2015-08-17 ENCOUNTER — Encounter: Payer: Self-pay | Admitting: Cardiology

## 2015-10-22 ENCOUNTER — Other Ambulatory Visit: Payer: Self-pay | Admitting: Cardiology

## 2015-10-24 NOTE — Telephone Encounter (Signed)
Rx(s) sent to pharmacy electronically.  

## 2015-12-21 ENCOUNTER — Encounter: Payer: Self-pay | Admitting: Gastroenterology

## 2016-01-03 ENCOUNTER — Other Ambulatory Visit: Payer: Self-pay | Admitting: *Deleted

## 2016-01-03 MED ORDER — NITROGLYCERIN 0.4 MG SL SUBL: 0.4000 mg | SUBLINGUAL_TABLET | SUBLINGUAL | Status: DC | PRN

## 2016-01-03 NOTE — Telephone Encounter (Signed)
REFILL 

## 2016-01-03 NOTE — Telephone Encounter (Signed)
I made the patient aware that he needs an appointment, but he asked that I send a message to see if your office would authorize a refill anyway.

## 2016-01-13 ENCOUNTER — Ambulatory Visit: Payer: Medicare Other | Admitting: Cardiology

## 2016-02-02 ENCOUNTER — Ambulatory Visit (INDEPENDENT_AMBULATORY_CARE_PROVIDER_SITE_OTHER): Payer: Medicare Other | Admitting: Cardiology

## 2016-02-02 ENCOUNTER — Encounter: Payer: Self-pay | Admitting: Cardiology

## 2016-02-02 VITALS — BP 122/84 | HR 87 | Ht 69.0 in | Wt 193.2 lb

## 2016-02-02 DIAGNOSIS — I2510 Atherosclerotic heart disease of native coronary artery without angina pectoris: Secondary | ICD-10-CM | POA: Diagnosis not present

## 2016-02-02 DIAGNOSIS — I10 Essential (primary) hypertension: Secondary | ICD-10-CM

## 2016-02-02 DIAGNOSIS — I255 Ischemic cardiomyopathy: Secondary | ICD-10-CM | POA: Diagnosis not present

## 2016-02-02 DIAGNOSIS — N19 Unspecified kidney failure: Secondary | ICD-10-CM

## 2016-02-02 DIAGNOSIS — E785 Hyperlipidemia, unspecified: Secondary | ICD-10-CM | POA: Diagnosis not present

## 2016-02-02 DIAGNOSIS — I82409 Acute embolism and thrombosis of unspecified deep veins of unspecified lower extremity: Secondary | ICD-10-CM

## 2016-02-02 DIAGNOSIS — I208 Other forms of angina pectoris: Secondary | ICD-10-CM

## 2016-02-02 DIAGNOSIS — I25810 Atherosclerosis of coronary artery bypass graft(s) without angina pectoris: Secondary | ICD-10-CM

## 2016-02-02 DIAGNOSIS — E669 Obesity, unspecified: Secondary | ICD-10-CM

## 2016-02-02 DIAGNOSIS — I1310 Hypertensive heart and chronic kidney disease without heart failure, with stage 1 through stage 4 chronic kidney disease, or unspecified chronic kidney disease: Secondary | ICD-10-CM

## 2016-02-02 DIAGNOSIS — I5042 Chronic combined systolic (congestive) and diastolic (congestive) heart failure: Secondary | ICD-10-CM

## 2016-02-02 MED ORDER — POTASSIUM CHLORIDE CRYS ER 20 MEQ PO TBCR: 20.0000 meq | EXTENDED_RELEASE_TABLET | Freq: Every day | ORAL | Status: AC

## 2016-02-02 MED ORDER — FENOFIBRATE 160 MG PO TABS: 160.0000 mg | ORAL_TABLET | Freq: Every day | ORAL | Status: AC

## 2016-02-02 MED ORDER — CARVEDILOL 25 MG PO TABS: 25.0000 mg | ORAL_TABLET | Freq: Two times a day (BID) | ORAL | Status: AC

## 2016-02-02 MED ORDER — ISOSORBIDE MONONITRATE ER 30 MG PO TB24: 30.0000 mg | ORAL_TABLET | Freq: Every day | ORAL | Status: AC

## 2016-02-02 MED ORDER — NITROGLYCERIN 0.4 MG SL SUBL: 0.4000 mg | SUBLINGUAL_TABLET | SUBLINGUAL | Status: DC | PRN

## 2016-02-02 MED ORDER — ATORVASTATIN CALCIUM 20 MG PO TABS: 20.0000 mg | ORAL_TABLET | Freq: Every day | ORAL | Status: AC

## 2016-02-02 NOTE — Assessment & Plan Note (Signed)
On statin and fibroids. Labs monitored by PCP. No myalgias. Unfortunately I don't have a current labs.

## 2016-02-02 NOTE — Assessment & Plan Note (Addendum)
Stable on current dose of Imdur. There will come a day when he may need to increase to 60 mg in the future. Since he is not taking Ranexa, I think we can take it off his list.

## 2016-02-02 NOTE — Assessment & Plan Note (Signed)
Blood pressure well controlled today. Continue current medications. No changes.

## 2016-02-02 NOTE — Assessment & Plan Note (Signed)
No further angina. Is due for follow-up Myoview stress test. Plan for [facility] prior to annual follow-up.

## 2016-02-02 NOTE — Assessment & Plan Note (Signed)
No active angina symptoms. Remains on stable regimen of beta blocker, hydralazine/Imdur as well as aspirin and statin. He is no longer taking Ranexa. Very rarely does he need to take sublingual nitroglycerin.  No longer on Plavix because of warfarin. It is been close to 5 years since his last stress test. 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 Plan: Lexiscan Myoview prior to annual follow-up next year -- he is usually not very forthcoming with symptoms.

## 2016-02-02 NOTE — Progress Notes (Signed)
PCP: Maggie Font, MD  Clinic Note: Chief Complaint  Patient presents with  . Follow-up    pt c/o SOB with activity  . Coronary Artery Disease  . Congestive Heart Failure    HPI: Devin Williams is a 70 y.o. male with a PMH below who presents today for 2-1/2 year follow-up. He is issued coronary artery disease and chronic combined systolic and diastolic heart failure as well as bilateral DVT and PE in 2013. He has had issues with renal insufficiency and cardiorenal syndrome.Devin Williams was last seen in Nov 2014. He was doing relatively well at that time. He has been followed up by his PCP very frequently, roughly every 3 months.  Recent Hospitalizations: None since June 2016  Studies Reviewed: None  Interval History: Devin Williams presents today doing relatively well. He says that he is still short of breath with exertion and this is been there for about a year plus. He denies any orthopnea or PND however. He also denies any resting or exertional chest tightness/pressure or angina. He is a small amount of edema that usually is pretty well-controlled. He is only been taking his Lasix when necessary as opposed to standing. He is only taking 3 tablets as needed. His support hose tend to help his edema (he is wearing the day), but he does not wear them routinely. He denies any rapid irregular heartbeat/palpitations. No culprit/near syncope or TIA/amaurosis fugax.   No melena, hematochezia, hematuria, or epstaxis. No claudication.  ROS: A comprehensive was performed. Review of Systems  Constitutional: Positive for weight loss (Over last couple years he has lost weight with diet and exercise). Negative for malaise/fatigue.  HENT: Negative for nosebleeds.   Respiratory: Negative for cough, shortness of breath and wheezing.   Cardiovascular: Positive for leg swelling. Negative for chest pain, claudication and PND.  Gastrointestinal: Negative for constipation, blood in stool and melena.   Musculoskeletal: Positive for myalgias and joint pain (Arthritis pain). Negative for back pain.  Skin: Positive for rash (Psoriasis is well-controlled currently.).  Neurological: Negative for dizziness (Some positional dizziness) and headaches.  Endo/Heme/Allergies: Does not bruise/bleed easily.  Psychiatric/Behavioral: Negative for depression and memory loss. The patient is not nervous/anxious and does not have insomnia.   All other systems reviewed and are negative.   Past Medical History  Diagnosis Date  . Hypertension   . DVT (deep venous thrombosis) ([place]) 11/2012    bilateral  . Psoriasis   . Chronic kidney disease     CKD 3  . CAD in native artery 1999    CABG X 5 '99, Myoview low risk 5/12  . Hyperlipidemia   . Alcohol abuse, daily use   . Diastolic dysfunction March 2014    EF 45% by echo  . GERD (gastroesophageal reflux disease)   . Optic nerve ischemia   . Problems with hearing   . Vision problems   . Carotid bruit 03/20/2007    Carotid doppler - R & L bulbs and proximal ICA -  irregular nonhemodynamically significant plaque of 0-49% diameter reduction  . Edema   . Hypoalbuminemia   . Sinus tachycardia ([place])   . Hx-TIA (transient ischemic attack)   . Barrett's esophagus 01/29/2013  . Stable angina (H[facility])     Chronic  . Cardiorenal syndrome with renal failure 03/23/2013  . Cardiomyopathy, ischemic - EF 45-50% March 2014 01/19/2013  . Chronic combined systolic and diastolic CHF, NYHA class 2 (HCC)     Past Surgical  History  Procedure Laterality Date  . Doppler echocardiography  March 2012    EF of 45% to 50%, which was down from before. There was moderate aortic   . Nm myoview ltd  01/2011    inferior attenuation, low risk and no ischemia, either due to infarction or scar but no ischemia noted.   . 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 Cardiac catheterization  02/14/2007    Native Vessels: RCA -  100%, LAD 100% at D1, Cx 100% in AV (AV Groov branch - collaterals fill distal RPL).  Grafts: LIMA-LAD & SVG-OM1-OM2 patent; SVG-RCA & SVG-Ramus occluded  . Colonoscopy N/A 01/24/2013    Procedure: COLONOSCOPY;  Surgeon: Lafayette Dragon, MD;  Location: [facility] ENDOSCOPY;  Service: Endoscopy;  Laterality: N/A;  . Esophagogastroduodenoscopy N/A 01/24/2013    Procedure: ESOPHAGOGASTRODUODENOSCOPY (EGD);  Surgeon: Lafayette Dragon, MD;  Location: [facility] ENDOSCOPY;  Service: Endoscopy;  Laterality: N/A;  . Knee surgery    . Appendectomy    Echo 11/2012: Study Conclusions  - Left ventricle: The cavity size was normal. Systolic function was mildly reduced. The estimated ejection fraction was in the range of 45% to 50%. Hypokinesis of the anteroseptal and anterior myocardium. Doppler parameters are consistent with abnormal left ventricular relaxation (grade 1 diastolic dysfunction). - Right ventricle: The cavity size was moderately dilated. Wall thickness was normal.  Prior to Admission medications   Medication Sig Start Date End Date Taking? Authorizing Provider  acetaminophen (TYLENOL) 500 MG tablet Take 2 tablets (1,000 mg total) by mouth 2 (two) times daily. Use for 4 days, then  as needed. 02/21/15  Yes Eugenie Filler, MD  aspirin EC 81 MG tablet Take 81 mg by mouth daily.   Yes Historical Provider, MD  atorvastatin (LIPITOR) 20 MG tablet Take 20 mg by mouth at bedtime.   Yes Historical Provider, MD  carvedilol (COREG) 25 MG tablet Take 1 tablet (25 mg total) by mouth 2 (two) times daily with a meal. 12/02/12  Yes Eugenie Filler, MD  dorzolamide-timolol (COSOPT) 22.3-6.8 MG/ML ophthalmic solution Place 1 drop into both eyes at bedtime.   Yes Historical Provider, MD  fenofibrate 160 MG tablet Take 160 mg by mouth daily.   Yes Historical Provider, MD  folic acid (FOLVITE) 1 MG tablet Take 1 tablet (1 mg total) by mouth daily. 02/01/13  Yes Brett Canales, PA-C  furosemide (LASIX) 20 MG tablet Take  3-4 tablets (60-80 mg total) by mouth 2 (two) times daily. Patient taking differently: Take 40 mg by mouth 2 (two) times daily.  07/27/13  Yes Marin Olp, MD  hydrocortisone cream 0.5 % Apply 1 application topically 2 (two) times daily.   Yes Historical Provider, MD  hydrOXYzine (ATARAX/VISTARIL) 10 MG tablet Take 10 mg by mouth 3 (three) times daily as needed for itching.   Yes Historical Provider, MD  isosorbide mononitrate (IMDUR) 30 MG 24 hr tablet Take 1-2 tablets (30-60 mg total) by mouth daily. 02/01/13  Yes Einar Pheasant Hager, PA-C  latanoprost (XALATAN) 0.005 % ophthalmic solution 1 drop at bedtime.   Yes Historical Provider, MD  Multiple Vitamin (MULTIVITAMIN WITH MINERALS) TABS Take 1 tablet by mouth daily.   Yes Historical Provider, MD  nitroGLYCERIN (NITROSTAT) 0.4 MG SL tablet Place 1 tablet (0.4 mg total) under the tongue every 5 (five) minutes as needed for chest pain. NEED OV. 01/03/16  Yes Leonie Man, MD  pantoprazole ([place])  40 MG tablet Take 1 tablet (40 mg total) by mouth 2 (two) times daily before a meal. 02/21/15  Yes Eugenie Filler, MD  potassium chloride SA (K-DUR,KLOR-CON) 20 MEQ tablet Take 20 mEq by mouth daily.   Yes Historical Provider, MD  RANEXA 500 MG 12 hr tablet TAKE 1 TABLET (500 MG TOTAL) BY MOUTH DAILY. <PLEASE MAKE APPOINTMENT FOR REFILLS> 03/21/15 No   Leonie Man, MD --Patient indicated that he has not been taking this medication for over a year   Triamcinolone Acetonide (TRIAMCINOLONE 0.1 % CREAM : EUCERIN) CREA Apply 1 application topically 3 (three) times daily. 02/01/13  Yes Brett Canales, PA-C  warfarin (COUMADIN) 5 MG tablet Take 5 mg by mouth daily.   Yes Historical Provider, MD - managed by PCP    Allergies  Allergen Reactions  . Penicillins Other (See Comments)    Childhood allergy.    Social History   Social History  . Marital Status: Married    Spouse Name: N/A  . Number of Children: N/A  . Years of Education: N/A   Social  History Main Topics  . Smoking status: Former Smoker -- 0.50 packs/day    Types: Cigarettes    Quit date: 11/17/2000  . Smokeless tobacco: Never Used     Comment: 70 yo  . Alcohol Use: 24.0 oz/week    40 Shots of liquor per week     Comment: four days PTA  . Drug Use: No  . Sexual Activity: Not Asked   Other Topics Concern  . None   Social History Narrative   Lives at home 2 story house, normally does not use an assist device, but borrowed his wife's cane recently.  Previously drove.     Former Heavy EtOH drinker - now says he "hardly drinks".   Runs a [employer].   family history includes Blindness in his maternal grandfather; Diabetes Mellitus II in his father; Psoriasis in his father; Scleroderma in his mother.   Wt Readings from Last 3 Encounters:  02/02/16 193 lb 3.2 oz (87.635 kg)  02/21/15 198 lb 14.4 oz (90.22 kg)  07/27/13 204 lb 8 oz (92.761 kg)    PHYSICAL EXAM BP 122/84 mmHg  Pulse 87  Ht 5\' 9"  (1.753 m)  Wt 193 lb 3.2 oz (87.635 kg)  BMI 28.52 kg/m2  SpO2 97% General appearance: alert, cooperative, appears stated age, no distress and Borderline obese. Pleasant mood and affect. HEENT: [place]/AT, EOMI, MMM, anicteric sclera; wears glasses Neck: no adenopathy, no carotid bruit and no JVD Lungs: clear to auscultation bilaterally, normal percussion bilaterally and non-labored Heart: regular rate and rhythm, S1 and S2 normal, no murmur, click, rub or gallop; nondisplaced PMI Abdomen: soft, non-tender; bowel sounds normal; no masses,  no organomegaly; no HJR Extremities: extremities normal, atraumatic, no cyanosis, and edema minimal with support stockings in place Pulses: 2+ and symmetric;  Skin: no evidence of bleeding or bruising or Extensive psoriatic lesions noted but the skin is smooth with no sloughing. It seems to be much better controlled than when I last saw him Neurologic: Mental status: Alert, oriented, thought content appropriate Cranial nerves:  normal (II-XII grossly intact)    Adult ECG Report  Rate: 87 ;  Rhythm: normal sinus rhythm and Nonspecific ST and T-wave abnormalities cannot exclude inferior ischemia. Normal axis, intervals and durations.;   Narrative Interpretation: Stable EKG with no change from June 2016. There are anterior R-wave now with normal progression excluding the previously indicated  possible anterior infarct, age undetermined.   Other studies Reviewed: Additional studies/ records that were reviewed today include:  Recent Labs:  Not available     ASSESSMENT / PLAN: Problem List Items Addressed This Visit    Stable angina ([place]) (Chronic)    Stable on current dose of Imdur. There will come a day when he may need to increase to 60 mg in the future. Since he is not taking Ranexa, I think we can take it off his list.      Relevant Medications   nitroGLYCERIN (NITROSTAT) 0.4 MG SL tablet   fenofibrate 160 MG tablet   atorvastatin (LIPITOR) 20 MG tablet   carvedilol (COREG) 25 MG tablet   isosorbide mononitrate (IMDUR) 30 MG 24 hr tablet   Obesity (BMI 30-39.9) (Chronic)    The patient understands the need to lose weight with diet and exercise. We have discussed specific strategies for this. -- With his weight loss, he actually is no longer in the "obese criteria ". I do think he needs to continue to work on weight loss however. I congratulated his efforts.      Hyperlipidemia (Chronic)    On statin and fibroids. Labs monitored by PCP. No myalgias. Unfortunately I don't have a current labs.      Relevant Medications   nitroGLYCERIN (NITROSTAT) 0.4 MG SL tablet   fenofibrate 160 MG tablet   atorvastatin (LIPITOR) 20 MG tablet   carvedilol (COREG) 25 MG tablet   isosorbide mononitrate (IMDUR) 30 MG 24 hr tablet   Other Relevant Orders   EKG 12-Lead   Myocardial Perfusion Imaging   Essential hypertension (Chronic)    Blood pressure well controlled today. Continue current medications. No changes.       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 This is monitored by PCP. As result he is not on a Thienopyridine antiplatelet agent,      Relevant Medications   nitroGLYCERIN (NITROSTAT) 0.4 MG SL tablet   fenofibrate 160 MG tablet   atorvastatin (LIPITOR) 20 MG tablet   carvedilol (COREG) 25 MG tablet   isosorbide mononitrate (IMDUR) 30 MG 24 hr tablet   Coronary artery disease involving autologous artery coronary bypass graft without angina pectoris (Chronic)    No further angina. Is due for follow-up Myoview stress test. Plan for [facility] prior to annual follow-up.      Relevant Medications   nitroGLYCERIN (NITROSTAT) 0.4 MG SL tablet   fenofibrate 160 MG tablet   atorvastatin (LIPITOR) 20 MG tablet   carvedilol (COREG) 25 MG tablet   isosorbide mononitrate (IMDUR) 30 MG 24 hr tablet   RESOLVED: Chronic combined systolic and diastolic heart failure, NYHA class 2 (HCC) (Chronic)   Relevant Medications   nitroGLYCERIN (NITROSTAT) 0.4 MG SL tablet   fenofibrate 160 MG tablet   atorvastatin (LIPITOR) 20 MG tablet   carvedilol (COREG) 25 MG tablet   isosorbide mononitrate (IMDUR) 30 MG 24 hr tablet   Chronic combined systolic and diastolic CHF, NYHA class 2 (HCC) (Chronic)    No further  exacerbations since summer/fall of 2014. Edema cc much improved now with support stockings and Lasix. Plan: Would recommend taking a standing dose of Lasix 20 mg a day and then using additional 2-3 tablets when necessary swelling or dyspnea. On stable dose of carvedilol, accommodation  hydralazine/Imdur for afterload reduction. Not on an ACE inhibitor or ARB secondary to renal insufficiency).      Relevant Medications   nitroGLYCERIN (NITROSTAT) 0.4 MG SL tablet   fenofibrate 160 MG tablet   atorvastatin (LIPITOR) 20 MG tablet   carvedilol (COREG) 25 MG tablet   isosorbide mononitrate (IMDUR) 30 MG 24 hr tablet   Other Relevant Orders   EKG 12-Lead   Myocardial Perfusion Imaging   Cardiorenal syndrome with renal failure (Chronic)    He is no longer seeing nephrology. Primary care doctor is checking his labs. It would be nice to have some labs.      Relevant Medications   nitroGLYCERIN (NITROSTAT) 0.4 MG SL tablet   fenofibrate 160 MG tablet   atorvastatin (LIPITOR) 20 MG tablet   carvedilol (COREG) 25 MG tablet   isosorbide mononitrate (IMDUR) 30 MG 24 hr tablet   Cardiomyopathy, ischemic - EF 45-50% March 2014 (Chronic)    Overall, relatively stable symptoms. No current indication for any invasive evaluation. On stable regimen      Relevant Medications   nitroGLYCERIN (NITROSTAT) 0.4 MG SL tablet   fenofibrate 160 MG tablet   atorvastatin (LIPITOR) 20 MG tablet   carvedilol (COREG) 25 MG tablet   isosorbide mononitrate (IMDUR) 30 MG 24 hr tablet   CAD, 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 Last cath 2008 - med Rx. Negative Myoview 5/12 - Primary (Chronic)    No active angina symptoms. Remains on stable regimen of beta blocker, hydralazine/Imdur as well as aspirin and statin. He is no longer taking Ranexa. Very rarely does he need to take sublingual nitroglycerin.  No longer on Plavix because of warfarin. It is been close to 5 years since his last stress test. 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 Plan: Lexiscan Myoview prior to annual follow-up next year -- he is usually not very forthcoming with symptoms.      Relevant Medications   nitroGLYCERIN (NITROSTAT) 0.4 MG SL tablet   fenofibrate 160 MG tablet   atorvastatin (LIPITOR) 20 MG tablet   carvedilol  (COREG) 25 MG tablet   isosorbide mononitrate (IMDUR) 30 MG 24 hr tablet   Other Relevant Orders   EKG 12-Lead   Myocardial Perfusion Imaging      Current medicines are reviewed at length with the patient today. (+/- concerns) He has not been taking Ranexa The following changes have been made: Essentially no major changes Take 20 mg Lasix daily. Using additional 2-3 tablets when necessary.   Studies Ordered:   Orders Placed This Encounter  Procedures  . Myocardial Perfusion Imaging  . EKG 12-Lead   Follow-up in one year - after Myoview   Glenetta Hew, [name], M.S. Interventional Cardiologist   Pager # [phone] Phone # [phone] [address] [place], [place] [address]

## 2016-02-02 NOTE — Assessment & Plan Note (Signed)
He is no longer seeing nephrology. Primary care doctor is checking his labs. It would be nice to have some labs.

## 2016-02-02 NOTE — Assessment & Plan Note (Signed)
The patient understands the need to lose weight with diet and exercise. We have discussed specific strategies for this. -- With his weight loss, he actually is no longer in the "obese criteria ". I do think he needs to continue to work on weight loss however. I congratulated his efforts.

## 2016-02-02 NOTE — Patient Instructions (Addendum)
PLEASE CALL YOUR PRIMARY DOCTOR HILL TO HAVE YOUR  HYDROXYZINE ( ATARAX) 10 MG  FILLED AND WARFARIN.  ALL OTHER MEDICATION WERE FILLED  Your physician has requested that you have a lexiscan myoview in may 2018. For further information please visit [email or web address]. Please follow instruction sheet, as given.  Your physician wants you to follow-up in 12 months with Dr Ellyn Hack.  you will receive a reminder letter in the mail two months in advance. If you don't receive a letter, please call our office to schedule the follow-up appointment.   If you need a refill on your cardiac medications before your next appointment, please call your pharmacy.

## 2016-02-02 NOTE — Assessment & Plan Note (Addendum)
Remains on warfarin. 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 This is monitored by PCP. As result he is not on a Thienopyridine antiplatelet agent,

## 2016-02-02 NOTE — Assessment & Plan Note (Signed)
No further exacerbations since summer/fall of 2014. Edema cc much improved now with support stockings and Lasix. Plan: Would recommend taking a standing dose of Lasix 20 mg a day and then using additional 2-3 tablets when necessary swelling or dyspnea. On stable dose of carvedilol, accommodation hydralazine/Imdur for afterload reduction. Not on an ACE inhibitor or ARB secondary to renal insufficiency).

## 2016-02-02 NOTE — Assessment & Plan Note (Signed)
Overall, relatively stable symptoms. No current indication for any invasive evaluation. On stable regimen

## 2016-05-16 ENCOUNTER — Other Ambulatory Visit: Payer: Self-pay | Admitting: Cardiology

## 2016-06-15 ENCOUNTER — Other Ambulatory Visit: Payer: Self-pay

## 2016-06-15 MED ORDER — NITROGLYCERIN 0.4 MG SL SUBL: SUBLINGUAL_TABLET | SUBLINGUAL | 3 refills | Status: DC

## 2016-06-18 ENCOUNTER — Other Ambulatory Visit: Payer: Self-pay

## 2016-06-18 MED ORDER — NITROGLYCERIN 0.4 MG SL SUBL: SUBLINGUAL_TABLET | SUBLINGUAL | 3 refills | Status: AC

## 2016-07-24 ENCOUNTER — Telehealth: Payer: Self-pay | Admitting: Cardiovascular Disease

## 2016-07-31 ENCOUNTER — Telehealth: Payer: Self-pay | Admitting: Cardiology

## 2016-07-31 NOTE — Telephone Encounter (Signed)
[place] delivered Death Certificate for Dr Ellyn Hack to sign.  Original Form given to Dr Ellyn Hack to review, complete and sign on 08-16-16.    Received signed Death Certificate back from Dr Ellyn Hack 07/31/16 am..  [place] and spoke with Levada Dy.  Advised Original Certificate signed and ready for pick up.

## 2016-07-31 NOTE — Telephone Encounter (Signed)
New message  Funeral home wants to know if death certificate is ready  Please call and let funeral home know when they can pick it up

## 2016-07-31 NOTE — Telephone Encounter (Signed)
Dr. Ellyn Hack, Concerning this patient of yours. Please review & advise if this was received - per chart notes looks like something was sent to the office.

## 2016-08-03 DEATH — deceased
